# Patient Record
Sex: Male | Born: 1975 | Race: Black or African American | Hispanic: No | Marital: Single | Smoking: Current every day smoker
Health system: Southern US, Community
[De-identification: ages and names within clinical notes are randomized; demographics above are authoritative.]

## PROBLEM LIST (undated history)

## (undated) DIAGNOSIS — M1990 Unspecified osteoarthritis, unspecified site: Secondary | ICD-10-CM

## (undated) DIAGNOSIS — J40 Bronchitis, not specified as acute or chronic: Secondary | ICD-10-CM

## (undated) DIAGNOSIS — F32A Depression, unspecified: Secondary | ICD-10-CM

## (undated) DIAGNOSIS — M545 Low back pain, unspecified: Secondary | ICD-10-CM

## (undated) DIAGNOSIS — J45909 Unspecified asthma, uncomplicated: Secondary | ICD-10-CM

## (undated) DIAGNOSIS — T782XXA Anaphylactic shock, unspecified, initial encounter: Secondary | ICD-10-CM

## (undated) DIAGNOSIS — M71122 Other infective bursitis, left elbow: Secondary | ICD-10-CM

## (undated) HISTORY — DX: Unspecified osteoarthritis, unspecified site: M19.90

## (undated) HISTORY — PX: FACIAL FRACTURE SURGERY: SHX1570

## (undated) HISTORY — DX: Depression, unspecified: F32.A

## (undated) HISTORY — PX: ELBOW SURGERY: SHX618

## (undated) HISTORY — PX: WISDOM TOOTH EXTRACTION: SHX21

## (undated) HISTORY — PX: HERNIA REPAIR: SHX51

## (undated) HISTORY — DX: Other infective bursitis, left elbow: M71.122

---

## 2009-11-24 ENCOUNTER — Emergency Department (HOSPITAL_COMMUNITY): Admission: EM | Admit: 2009-11-24 | Discharge: 2009-11-24 | Payer: Self-pay | Admitting: Emergency Medicine

## 2010-12-25 LAB — RPR: RPR Ser Ql: NONREACTIVE

## 2011-01-01 ENCOUNTER — Emergency Department (HOSPITAL_COMMUNITY)
Admission: EM | Admit: 2011-01-01 | Discharge: 2011-01-01 | Disposition: A | Discharge status: Home / Self Care | Payer: Self-pay | Attending: Emergency Medicine | Admitting: Emergency Medicine

## 2011-01-01 ENCOUNTER — Emergency Department (HOSPITAL_COMMUNITY): Payer: Self-pay

## 2011-01-01 DIAGNOSIS — F411 Generalized anxiety disorder: Secondary | ICD-10-CM | POA: Insufficient documentation

## 2011-01-01 DIAGNOSIS — H05019 Cellulitis of unspecified orbit: Secondary | ICD-10-CM | POA: Insufficient documentation

## 2011-01-01 DIAGNOSIS — H5710 Ocular pain, unspecified eye: Secondary | ICD-10-CM | POA: Insufficient documentation

## 2011-01-01 MED ORDER — IOHEXOL 300 MG/ML  SOLN
100.0000 mL | Freq: Once | INTRAMUSCULAR | Status: AC | PRN
  Administered 2011-01-01: 100 mL via INTRAVENOUS

## 2011-04-11 ENCOUNTER — Emergency Department (HOSPITAL_COMMUNITY)
Admission: EM | Admit: 2011-04-11 | Discharge: 2011-04-11 | Disposition: A | Discharge status: Home / Self Care | Payer: Self-pay | Attending: Emergency Medicine | Admitting: Emergency Medicine

## 2011-04-11 DIAGNOSIS — H109 Unspecified conjunctivitis: Secondary | ICD-10-CM | POA: Insufficient documentation

## 2011-08-17 ENCOUNTER — Encounter: Payer: Self-pay | Admitting: Emergency Medicine

## 2011-08-17 ENCOUNTER — Emergency Department (HOSPITAL_COMMUNITY)
Admission: EM | Admit: 2011-08-17 | Discharge: 2011-08-18 | Disposition: A | Discharge status: Home / Self Care | Payer: Self-pay | Attending: Emergency Medicine | Admitting: Emergency Medicine

## 2011-08-17 DIAGNOSIS — R21 Rash and other nonspecific skin eruption: Secondary | ICD-10-CM | POA: Insufficient documentation

## 2011-08-17 DIAGNOSIS — M79609 Pain in unspecified limb: Secondary | ICD-10-CM | POA: Insufficient documentation

## 2011-08-17 MED ORDER — HYDROCODONE-ACETAMINOPHEN 5-325 MG PO TABS
1.0000 | ORAL_TABLET | Freq: Once | ORAL | Status: AC
  Administered 2011-08-18: 1 via ORAL
  Filled 2011-08-17: qty 1

## 2011-08-17 MED ORDER — PREDNISONE (PAK) 10 MG PO TABS: ORAL_TABLET | ORAL | Status: AC

## 2011-08-17 NOTE — ED Provider Notes (Signed)
History     CSN: [id number] Arrival date & time: 08/17/2011  7:58 PM   First MD Initiated Contact with Patient 08/17/11 2215      Chief Complaint  Patient presents with  . Pain   HPI History provided by the patient. The patient presents with complaints of rash, dry skin, itching and pain to his hands and feet. Symptoms began 5 days ago. Patient was traveling in [place] at that time. Patient mentions recent history of [place] fever diagnosis 3 weeks ago. He was treated with one week of doxycycline. Patient had felt well since that time with no other symptoms. Patient denies any previous history of skin conditions or known allergies. He denies any contact with any environmental chemicals or toxins.  Pt has been using Neosporin and hydrocortisone creams over skin regularly without improvement.  Pt denies other symptoms.  No fever, chills, sweats, N/V.    History reviewed. No pertinent past medical history.  Past Surgical History  Procedure Date  . Wisdom tooth extraction   . Facial fracture surgery     Family History  Problem Relation Age of Onset  . Hypertension Mother   . Heart attack Father   . Cancer Other     History  Substance Use Topics  . Smoking status: Passive Smoker  . Smokeless tobacco: Not on file  . Alcohol Use: Yes      Review of Systems  Constitutional: Negative for fever and chills.  Respiratory: Negative for shortness of breath.   Cardiovascular: Negative for chest pain.  Gastrointestinal: Negative for nausea, vomiting, abdominal pain and constipation.  Skin: Positive for rash.  All other systems reviewed and are negative.    Allergies  Review of patient's allergies indicates no known allergies.  Home Medications   Current Outpatient Rx  Name Route Sig Dispense Refill  . DIPHENHYDRAMINE HCL 25 MG PO TABS Oral Take 25 mg by mouth every 6 (six) hours as needed. Used for itching    . DOXYCYCLINE HYCLATE 100 MG PO TBEC Oral Take 100 mg  by mouth 2 (two) times daily.      Marland Kitchen NAPROXEN 500 MG PO TABS Oral Take 500 mg by mouth as needed. Used for pain      BP 142/89  Pulse 61  Temp(Src) 98.7 F (37.1 C) (Oral)  Resp 20  SpO2 97%  Physical Exam  Nursing note and vitals reviewed. Constitutional: He is oriented to person, place, and time. He appears well-developed and well-nourished. No distress.  HENT:  Head: Normocephalic and atraumatic.  Pulmonary/Chest: Effort normal. He has no wheezes. He has no rales.  Abdominal: Soft.  Neurological: He is alert and oriented to person, place, and time.  Skin: Skin is warm.       Dry scaling skin with desquamation over bilateral hands and feet with areas of dry hyperpigmented plaques.    ED Course  Procedures (including critical care time)  1. Rash       MDM  Patient seen and evaluated. Patient in no acute distress.  Pt seen and discussed with Attending Physician.  Will try oral steroid to see if rash responds.  Will give referral for dermatology.      Angus Seller, PA 08/18/11 [phone]

## 2011-08-17 NOTE — ED Notes (Signed)
Pt states he was seen at [facility] 3 Sundays ago and was diagnosed with [facility] Fever  Pt was given a weeks worth of doxycycline and completed that  Pt states the rash went away  Pt states now his skin in peeling off his hands, pt's feet are swollen, cracked and skin is peeling off them too, and is having pain in all his joints

## 2011-08-17 NOTE — Discharge Instructions (Signed)
At this time your providers today are unsure of the exact cause of your rash and symptoms. Recommend trying a course of oral steroid medications to see if this relieves her symptoms. If your condition worsens or does not improve is recommended the followup with the dermatology specialist. He'll he develop fever, chills, sweats, nausea or vomiting.  Rash, Generic Many things can cause a rash. We are not certain what is causing the rash that you have. Some causes include infection, allergic reactions, medications, and chemicals. Sometimes something in your home that comes in contact with your skin may cause the rash. These include pets, new soaps, cosmetics, and foods. HOME CARE INSTRUCTIONS   Avoid extreme heat or cold, unless otherwise instructed. This can make the itching worse.   A cool bath or shower or a cool washcloth can sometimes ease the itching.   Avoid scratching. This can cause infection.   Take those medications prescribed by your caregiver.  SEEK IMMEDIATE MEDICAL CARE IF:  You develop increasing pain, swelling, or redness.   You develop a fever.   You develop new or severe symptoms such as body aches and pains, diarrhea, vomiting.   Your rash is not better in 3 days.  Document Released: 09/10/2002 Document Revised: 06/02/2011 Document Reviewed: 11/15/2008 [facility] Patient Information 2012 [place], [place].

## 2011-08-17 NOTE — ED Provider Notes (Signed)
Medical screening examination/treatment/procedure(s) were conducted as a shared visit with non-physician practitioner(s) and myself.  I personally evaluated the patient during the encounter 35 year male, with no significant past medical history, who had recently been treated with doxycycline for a week in [place] spotted fever.  Presents to the emergency department complaining of peeling hands and feet with swelling and pain. No fever. No systemic sxs.    Nicholes Stairs, MD 08/17/11 [phone]

## 2011-08-18 MED ORDER — HYDROCODONE-ACETAMINOPHEN 5-325 MG PO TABS: 2.0000 | ORAL_TABLET | ORAL | Status: AC | PRN

## 2011-08-18 NOTE — ED Provider Notes (Signed)
Medical screening examination/treatment/procedure(s) were conducted as a shared visit with non-physician practitioner(s) and myself.  I personally evaluated the patient during the encounter Medical screening examination/treatment/procedure(s) were conducted as a shared visit with non-physician practitioner(s) and myself.  I personally evaluated the patient during the encounter 35 year male, with no significant past medical history, who had recently been treated with doxycycline for a week in [place] spotted fever.  Presents to the emergency department complaining of peeling hands and feet with swelling and pain. No fever. No systemic sxs.    Nicholes Stairs, MD 08/17/11 2359   Nicholes Stairs, MD 08/18/11 1535

## 2014-02-11 ENCOUNTER — Telehealth: Payer: Self-pay | Admitting: Family Medicine

## 2014-02-11 NOTE — Telephone Encounter (Signed)
Patient aware we are out of appts today and will call us in the am if not feeling better in the am

## 2014-05-12 ENCOUNTER — Emergency Department (HOSPITAL_COMMUNITY)
Admission: EM | Admit: 2014-05-12 | Discharge: 2014-05-12 | Disposition: A | Discharge status: Home / Self Care | Payer: 59 | Attending: Emergency Medicine | Admitting: Emergency Medicine

## 2014-05-12 ENCOUNTER — Encounter (HOSPITAL_COMMUNITY): Payer: Self-pay | Admitting: Emergency Medicine

## 2014-05-12 ENCOUNTER — Emergency Department (HOSPITAL_COMMUNITY): Payer: 59

## 2014-05-12 DIAGNOSIS — IMO0002 Reserved for concepts with insufficient information to code with codable children: Secondary | ICD-10-CM | POA: Diagnosis not present

## 2014-05-12 DIAGNOSIS — X58XXXA Exposure to other specified factors, initial encounter: Secondary | ICD-10-CM | POA: Insufficient documentation

## 2014-05-12 DIAGNOSIS — Y9289 Other specified places as the place of occurrence of the external cause: Secondary | ICD-10-CM | POA: Diagnosis not present

## 2014-05-12 DIAGNOSIS — S39012A Strain of muscle, fascia and tendon of lower back, initial encounter: Secondary | ICD-10-CM

## 2014-05-12 DIAGNOSIS — Y9389 Activity, other specified: Secondary | ICD-10-CM | POA: Diagnosis not present

## 2014-05-12 DIAGNOSIS — Z87891 Personal history of nicotine dependence: Secondary | ICD-10-CM | POA: Insufficient documentation

## 2014-05-12 MED ORDER — HYDROCODONE-ACETAMINOPHEN 5-325 MG PO TABS
1.0000 | ORAL_TABLET | Freq: Once | ORAL | Status: AC
  Administered 2014-05-12: 1 via ORAL
  Filled 2014-05-12: qty 1

## 2014-05-12 MED ORDER — CYCLOBENZAPRINE HCL 10 MG PO TABS
10.0000 mg | ORAL_TABLET | Freq: Three times a day (TID) | ORAL | Status: DC | PRN
Start: 2014-05-12 — End: 2015-09-17

## 2014-05-12 MED ORDER — PREDNISONE 10 MG PO TABS: ORAL_TABLET | ORAL | Status: DC

## 2014-05-12 MED ORDER — HYDROCODONE-ACETAMINOPHEN 5-325 MG PO TABS: ORAL_TABLET | ORAL | Status: DC

## 2014-05-12 MED ORDER — CYCLOBENZAPRINE HCL 10 MG PO TABS
10.0000 mg | ORAL_TABLET | Freq: Once | ORAL | Status: AC
  Administered 2014-05-12: 10 mg via ORAL
  Filled 2014-05-12: qty 1

## 2014-05-12 NOTE — ED Notes (Addendum)
PT c/o lower back pain worsening for the past two days with muscle tightness.
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
## 2014-05-12 NOTE — Discharge Instructions (Signed)
Back Pain, Adult Low back pain is very common. About 1 in 5 people have back pain.The cause of low back pain is rarely dangerous. The pain often gets better over time.About half of people with a sudden onset of back pain feel better in just 2 weeks. About 8 in 10 people feel better by 6 weeks.  CAUSES Some common causes of back pain include:  Strain of the muscles or ligaments supporting the spine.  Wear and tear (degeneration) of the spinal discs.  Arthritis.  Direct injury to the back. DIAGNOSIS Most of the time, the direct cause of low back pain is not known.However, back pain can be treated effectively even when the exact cause of the pain is unknown.Answering your caregiver's questions about your overall health and symptoms is one of the most accurate ways to make sure the cause of your pain is not dangerous. If your caregiver needs more information, he or she may order lab work or imaging tests (X-rays or MRIs).However, even if imaging tests show changes in your back, this usually does not require surgery. HOME CARE INSTRUCTIONS For many people, back pain returns.Since low back pain is rarely dangerous, it is often a condition that people can learn to manageon their own.   Remain active. It is stressful on the back to sit or stand in one place. Do not sit, drive, or stand in one place for more than 30 minutes at a time. Take short walks on level surfaces as soon as pain allows.Try to increase the length of time you walk each day.  Do not stay in bed.Resting more than 1 or 2 days can delay your recovery.  Do not avoid exercise or work.Your body is made to move.It is not dangerous to be active, even though your back may hurt.Your back will likely heal faster if you return to being active before your pain is gone.  Pay attention to your body when you bend and lift. Many people have less discomfortwhen lifting if they bend their knees, keep the load close to their bodies,and  avoid twisting. Often, the most comfortable positions are those that put less stress on your recovering back.  Find a comfortable position to sleep. Use a firm mattress and lie on your side with your knees slightly bent. If you lie on your back, put a pillow under your knees.  Only take over-the-counter or prescription medicines as directed by your caregiver. Over-the-counter medicines to reduce pain and inflammation are often the most helpful.Your caregiver may prescribe muscle relaxant drugs.These medicines help dull your pain so you can more quickly return to your normal activities and healthy exercise.  Put ice on the injured area.  Put ice in a plastic bag.  Place a towel between your skin and the bag.  Leave the ice on for 15-20 minutes, 03-04 times a day for the first 2 to 3 days. After that, ice and heat may be alternated to reduce pain and spasms.  Ask your caregiver about trying back exercises and gentle massage. This may be of some benefit.  Avoid feeling anxious or stressed.Stress increases muscle tension and can worsen back pain.It is important to recognize when you are anxious or stressed and learn ways to manage it.Exercise is a great option. SEEK MEDICAL CARE IF:  You have pain that is not relieved with rest or medicine.  You have pain that does not improve in 1 week.  You have new symptoms.  You are generally not feeling well. SEEK   IMMEDIATE MEDICAL CARE IF:   You have pain that radiates from your back into your legs.  You develop new bowel or bladder control problems.  You have unusual weakness or numbness in your arms or legs.  You develop nausea or vomiting.  You develop abdominal pain.  You feel faint. Document Released: 09/20/2005 Document Revised: 03/21/2012 Document Reviewed: 01/22/2014 [employer] Patient Information 2015 [employer]. This information is not intended to replace advice given to you by your health care provider. Make sure you  discuss any questions you have with your health care provider.  

## 2014-05-16 NOTE — ED Provider Notes (Signed)
Medical screening examination/treatment/procedure(s) were performed by non-physician practitioner and as supervising physician I was immediately available for consultation/collaboration.   EKG Interpretation None        Vanetta MuldersScott Chia Rock, MD 05/16/14 0210

## 2015-09-17 ENCOUNTER — Encounter (HOSPITAL_COMMUNITY): Payer: Self-pay | Admitting: Emergency Medicine

## 2015-09-17 ENCOUNTER — Emergency Department (HOSPITAL_COMMUNITY)
Admission: EM | Admit: 2015-09-17 | Discharge: 2015-09-17 | Disposition: A | Discharge status: Home / Self Care | Payer: Self-pay | Attending: Emergency Medicine | Admitting: Emergency Medicine

## 2015-09-17 DIAGNOSIS — M545 Low back pain: Secondary | ICD-10-CM | POA: Insufficient documentation

## 2015-09-17 HISTORY — DX: Low back pain, unspecified: M54.50

## 2015-09-17 HISTORY — DX: Low back pain: M54.5

## 2015-09-17 MED ORDER — HYDROCODONE-ACETAMINOPHEN 5-325 MG PO TABS: 2.0000 | ORAL_TABLET | ORAL | Status: DC | PRN

## 2015-09-17 MED ORDER — IBUPROFEN 800 MG PO TABS: 800.0000 mg | ORAL_TABLET | Freq: Three times a day (TID) | ORAL | Status: DC

## 2015-09-17 MED ORDER — METHYLPREDNISOLONE SODIUM SUCC 125 MG IJ SOLR
125.0000 mg | Freq: Once | INTRAMUSCULAR | Status: AC
  Administered 2015-09-17: 125 mg via INTRAMUSCULAR
  Filled 2015-09-17: qty 2

## 2015-09-17 MED ORDER — CYCLOBENZAPRINE HCL 10 MG PO TABS: 10.0000 mg | ORAL_TABLET | Freq: Two times a day (BID) | ORAL | Status: DC | PRN

## 2015-09-17 MED ORDER — KETOROLAC TROMETHAMINE 60 MG/2ML IM SOLN
60.0000 mg | Freq: Once | INTRAMUSCULAR | Status: AC
  Administered 2015-09-17: 60 mg via INTRAMUSCULAR
  Filled 2015-09-17: qty 2

## 2015-09-17 NOTE — ED Notes (Signed)
Pt c/o low back pain, left sided. Denies radiation. Denies injury. Denies GI/GU symptoms. Does report lying in "uncomfortable position" in bed last night.

## 2015-09-17 NOTE — Discharge Instructions (Signed)

## 2015-09-17 NOTE — ED Provider Notes (Signed)
CSN: [id number]     Arrival date & time 09/17/15  1336 History   First MD Initiated Contact with Patient 09/17/15 1416     Chief Complaint  Patient presents with  . Back Pain     (Consider location/radiation/quality/duration/timing/severity/associated sxs/prior Treatment) Patient is a 39 y.o. male presenting with back pain. The history is provided by the patient. No language interpreter was used.  Back Pain Location:  Lumbar spine Quality:  Aching Radiates to:  Does not radiate Pain severity:  Moderate Pain is:  Worse during the night Onset quality:  Gradual Duration:  2 days Timing:  Constant Chronicity:  New Context: not recent illness   Relieved by:  Nothing Worsened by:  Nothing tried Ineffective treatments:  None tried Associated symptoms: no leg pain and no numbness     Past Medical History  Diagnosis Date  . Low back pain    Past Surgical History  Procedure Laterality Date  . Wisdom tooth extraction    . Facial fracture surgery     Family History  Problem Relation Age of Onset  . Hypertension Mother   . Heart attack Father   . Cancer Other    Social History  Substance Use Topics  . Smoking status: Former Games developermoker  . Smokeless tobacco: None  . Alcohol Use: Yes     Comment: occassionally    Review of Systems  Musculoskeletal: Positive for back pain.  Neurological: Negative for numbness.  All other systems reviewed and are negative.     Allergies  Doxycycline  Home Medications   Prior to Admission medications   Medication Sig Start Date End Date Taking? Authorizing Provider  naproxen sodium (ALEVE) 220 MG tablet Take 440 mg by mouth daily as needed (for pain).   Yes Historical Provider, MD  cyclobenzaprine (FLEXERIL) 10 MG tablet Take 1 tablet (10 mg total) by mouth 2 (two) times daily as needed for muscle spasms. 09/17/15   Elson AreasLeslie K Raynell Scott, PA-C  HYDROcodone-acetaminophen (NORCO/VICODIN) 5-325 MG tablet Take 2 tablets by mouth every 4 (four)  hours as needed. 09/17/15   Elson AreasLeslie K Kalden Wanke, PA-C  ibuprofen (ADVIL,MOTRIN) 800 MG tablet Take 1 tablet (800 mg total) by mouth 3 (three) times daily. 09/17/15   Elson AreasLeslie K Dwayn Moravek, PA-C  predniSONE (DELTASONE) 10 MG tablet Take 6 tablets day one, 5 tablets day two, 4 tablets day three, 3 tablets day four, 2 tablets day five, then 1 tablet day six Patient not taking: Reported on 09/17/2015 05/12/14   Tammy Triplett, PA-C   BP 113/71 mmHg  Pulse 55  Temp(Src) 98 F (36.7 C) (Oral)  Resp 18  Ht 6' (1.829 m)  Wt 92.987 kg  BMI 27.80 kg/m2  SpO2 100% Physical Exam  Constitutional: He is oriented to person, place, and time. He appears well-developed and well-nourished.  HENT:  Head: Normocephalic.  Eyes: EOM are normal.  Neck: Normal range of motion.  Cardiovascular: Normal rate.   Pulmonary/Chest: Effort normal.  Abdominal: He exhibits no distension.  Musculoskeletal:  ls spine tender lower lumbar and sacral spine  nv and ns intact  Neurological: He is alert and oriented to person, place, and time.  Psychiatric: He has a normal mood and affect.  Nursing note and vitals reviewed.   ED Course  Procedures (including critical care time) Labs Review Labs Reviewed - No data to display  Imaging Review No results found. I have personally reviewed and evaluated these images and lab results as part of my medical decision-making.  EKG Interpretation None      MDM   Final diagnoses:  Low back pain without sciatica, unspecified back pain laterality    Meds ordered this encounter  Medications  . ketorolac (TORADOL) injection 60 mg    Sig:   . methylPREDNISolone sodium succinate (SOLU-MEDROL) 125 mg/2 mL injection 125 mg    Sig:   . cyclobenzaprine (FLEXERIL) 10 MG tablet    Sig: Take 1 tablet (10 mg total) by mouth 2 (two) times daily as needed for muscle spasms.    Dispense:  20 tablet    Refill:  0    Order Specific Question:  Supervising Provider    Answer:  MILLER, BRIAN  [[id number]]  . HYDROcodone-acetaminophen (NORCO/VICODIN) 5-325 MG tablet    Sig: Take 2 tablets by mouth every 4 (four) hours as needed.    Dispense:  20 tablet    Refill:  0    Order Specific Question:  Supervising Provider    Answer:  MILLER, BRIAN [[id number]]  . ibuprofen (ADVIL,MOTRIN) 800 MG tablet    Sig: Take 1 tablet (800 mg total) by mouth 3 (three) times daily.    Dispense:  21 tablet    Refill:  0    Order Specific Question:  Supervising Provider    Answer:  Eber Hong [[id number]]   An After Visit Summary was printed and given to the patient.   Elson Areas, PA-C 09/17/15 1608  Samuel Jester, DO 09/20/15 [phone]

## 2017-06-14 ENCOUNTER — Encounter (HOSPITAL_COMMUNITY): Payer: Self-pay

## 2017-06-14 ENCOUNTER — Emergency Department (HOSPITAL_COMMUNITY)
Admission: EM | Admit: 2017-06-14 | Discharge: 2017-06-15 | Disposition: A | Discharge status: Home / Self Care | Payer: Self-pay | Attending: Emergency Medicine | Admitting: Emergency Medicine

## 2017-06-14 DIAGNOSIS — Z87891 Personal history of nicotine dependence: Secondary | ICD-10-CM | POA: Insufficient documentation

## 2017-06-14 DIAGNOSIS — M79601 Pain in right arm: Secondary | ICD-10-CM

## 2017-06-14 DIAGNOSIS — S40021A Contusion of right upper arm, initial encounter: Secondary | ICD-10-CM | POA: Insufficient documentation

## 2017-06-14 DIAGNOSIS — Y9389 Activity, other specified: Secondary | ICD-10-CM | POA: Insufficient documentation

## 2017-06-14 DIAGNOSIS — W460XXA Contact with hypodermic needle, initial encounter: Secondary | ICD-10-CM | POA: Insufficient documentation

## 2017-06-14 DIAGNOSIS — Y999 Unspecified external cause status: Secondary | ICD-10-CM | POA: Insufficient documentation

## 2017-06-14 DIAGNOSIS — Y929 Unspecified place or not applicable: Secondary | ICD-10-CM | POA: Insufficient documentation

## 2017-06-14 DIAGNOSIS — Z79899 Other long term (current) drug therapy: Secondary | ICD-10-CM | POA: Insufficient documentation

## 2017-06-14 NOTE — ED Triage Notes (Addendum)
Pt reports that he was giving plasma on Sunday and they had problems and had to reaccess his vein. He reports that they gave him a infusion of normal saline and that since then, he has been experiencing pain and swelling in his R arm. Significant edema noted. He reports using Aleve, aspirin and ice at home without relief. He also is reporting paraesthesias in his R pinky and ring fingers. A&Ox4. Ambulatory.

## 2017-06-15 NOTE — ED Provider Notes (Signed)
TIME SEEN: 1:37 AM  CHIEF COMPLAINT: right arm swelling  HPI: Pt is a 41 y.o. Right-hand-dominant male who presents to the emergency department with complaints of right arm pain and swelling. He states that he donated plasma around 1 PM on Sunday September 9. He states they had a hard time placing the IV. He states they had to remove the IV and place it in his left arm. He was able to successfully donate plasma from his left arm. He states since that time he has had bruising, swelling to the right arm in some tingling in the tips of the fourth and fifth right digits. He has full range of motion in his hand and wrist and elbow. No other injury. No redness or warmth. No fever. He is not on antiplatelets or anticoagulants. States he does not believe he was given the medications other than possible IV fluids through the IV in his right arm.  ROS: See HPI Constitutional: no fever  Eyes: no drainage  ENT: no runny nose   Cardiovascular:  no chest pain  Resp: no SOB  GI: no vomiting GU: no dysuria Integumentary: no rash  Allergy: no hives  Musculoskeletal: no leg swelling  Neurological: no slurred speech ROS otherwise negative  PAST MEDICAL HISTORY/PAST SURGICAL HISTORY:  Past Medical History:  Diagnosis Date  . Low back pain     MEDICATIONS:  Prior to Admission medications   Medication Sig Start Date End Date Taking? Authorizing Provider  naproxen sodium (ALEVE) 220 MG tablet Take 440 mg by mouth daily as needed (for pain).   Yes [provider]  cyclobenzaprine (FLEXERIL) 10 MG tablet Take 1 tablet (10 mg total) by mouth 2 (two) times daily as needed for muscle spasms. Patient not taking: Reported on 06/15/2017 09/17/15   Elson Areas, PA-C  HYDROcodone-acetaminophen (NORCO/VICODIN) 5-325 MG tablet Take 2 tablets by mouth every 4 (four) hours as needed. Patient not taking: Reported on 06/15/2017 09/17/15   Elson Areas, PA-C  ibuprofen (ADVIL,MOTRIN) 800 MG tablet Take 1  tablet (800 mg total) by mouth 3 (three) times daily. Patient not taking: Reported on 06/15/2017 09/17/15   Elson Areas, PA-C  predniSONE (DELTASONE) 10 MG tablet Take 6 tablets day one, 5 tablets day two, 4 tablets day three, 3 tablets day four, 2 tablets day five, then 1 tablet day six Patient not taking: Reported on 09/17/2015 05/12/14   Pauline Aus, PA-C    ALLERGIES:  Allergies  Allergen Reactions  . Doxycycline Hives    SOCIAL HISTORY:  Social History  Substance Use Topics  . Smoking status: Former Games developer  . Smokeless tobacco: Not on file  . Alcohol use Yes     Comment: occassionally    FAMILY HISTORY: Family History  Problem Relation Age of Onset  . Hypertension Mother   . Heart attack Father   . Cancer Other     EXAM: BP (!) 109/93 (BP Location: Left Arm)   Pulse 70   Temp 98.1 F (36.7 C) (Oral)   Resp 16   SpO2 95%  CONSTITUTIONAL: Alert and oriented and responds appropriately to questions. Well-appearing; well-nourished HEAD: Normocephalic EYES: Conjunctivae clear, pupils appear equal, EOMI ENT: normal nose; moist mucous membranes NECK: Supple, no meningismus, no nuchal rigidity, no LAD  CARD: RRR; S1 and S2 appreciated; no murmurs, no clicks, no rubs, no gallops RESP: Normal chest excursion without splinting or tachypnea; breath sounds clear and equal bilaterally; no wheezes, no rhonchi, no rales, no hypoxia  or respiratory distress, speaking full sentences ABD/GI: Normal bowel sounds; non-distended; soft, non-tender, no rebound, no guarding, no peritoneal signs, no hepatosplenomegaly BACK:  The back appears normal and is non-tender to palpation, there is no CVA tenderness EXT: patient has very mild swelling to the right inner elbow with ecchymosis around the right antecubital fossa. Compartments are completely soft. He has full range of motion in his right elbow, wrist and hand. Normal grip strength. Normal radial and ulnar pulses bilaterally and normal  capillary refill in the right fingertips. He reports some tingling but no decreased sensation in the right hand. Normal movement of the right arm.  Normal ROM in all joints; otherwise extremities are non-tender to palpation; no edema; normal capillary refill; no cyanosis, there is no redness or warmth noted to the right arm, no induration or fluctuance   SKIN: Normal color for age and race; warm; no rash NEURO: Moves all extremities equally PSYCH: The patient's mood and manner are appropriate. Grooming and personal hygiene are appropriate.  MEDICAL DECISION MAKING: Patient here with ecchymosis and very mild swelling noted to the right arm after he had an IV placed in his right antecubital fossa to donate plasma a few days ago. He is not on antiplatelets or anticoagulants. I doubt that there is any active extravasation at this time. He has no sign of compartment syndrome at all. No sign of superimposed infection. Have recommended that he keep his arm elevated and apply ice. Recommend alternating Tylenol and Motrin as needed for pain. I do not think he needs antibiotics, imaging. I do not think this is a DVT. No sign of gout, septic arthritis, cellulitis at this time. I feel he is safe for discharge. Patient comfortable with this plan. We did discussed return precautions especially signs and symptoms of compartment syndrome.  I suspect that the tingling in his fourth and fifth digit are likely from either neuropraxia from possible nerve injury after the IV was placed near the nerve and the neurovascular bundle or from compression from swelling over this inner and posterior aspect of the right elbow around the ulnar nerve.  Discussed with patient that this should improve over time and as the swelling decreases.  He states he is concerned because it looked like the swelling and bruising was moving down his arm but I think that this is likely secondary to gravity.   At this time, I do not feel there is any  life-threatening condition present. I have reviewed and discussed all results (EKG, imaging, lab, urine as appropriate) and exam findings with patient/family. I have reviewed nursing notes and appropriate previous records.  I feel the patient is safe to be discharged home without further emergent workup and can continue workup as an outpatient as needed. Discussed usual and customary return precautions. Patient/family verbalize understanding and are comfortable with this plan.  Outpatient follow-up has been provided if needed. All questions have been answered.     Ahjanae Cassel, Layla MawKristen N, DO 06/15/17 [phone]

## 2017-06-15 NOTE — Discharge Instructions (Signed)
You may use Naproxen  every 12 hours as needed for pain.

## 2018-08-27 ENCOUNTER — Emergency Department (HOSPITAL_COMMUNITY)
Admission: EM | Admit: 2018-08-27 | Discharge: 2018-08-27 | Disposition: A | Discharge status: Home / Self Care | Payer: Self-pay | Attending: Emergency Medicine | Admitting: Emergency Medicine

## 2018-08-27 ENCOUNTER — Other Ambulatory Visit: Payer: Self-pay

## 2018-08-27 ENCOUNTER — Encounter (HOSPITAL_COMMUNITY): Payer: Self-pay | Admitting: Emergency Medicine

## 2018-08-27 DIAGNOSIS — S025XXA Fracture of tooth (traumatic), initial encounter for closed fracture: Secondary | ICD-10-CM | POA: Insufficient documentation

## 2018-08-27 DIAGNOSIS — Y999 Unspecified external cause status: Secondary | ICD-10-CM | POA: Insufficient documentation

## 2018-08-27 DIAGNOSIS — Y93E8 Activity, other personal hygiene: Secondary | ICD-10-CM | POA: Insufficient documentation

## 2018-08-27 DIAGNOSIS — F17210 Nicotine dependence, cigarettes, uncomplicated: Secondary | ICD-10-CM | POA: Insufficient documentation

## 2018-08-27 DIAGNOSIS — Y929 Unspecified place or not applicable: Secondary | ICD-10-CM | POA: Insufficient documentation

## 2018-08-27 DIAGNOSIS — S0993XA Unspecified injury of face, initial encounter: Secondary | ICD-10-CM

## 2018-08-27 DIAGNOSIS — X58XXXA Exposure to other specified factors, initial encounter: Secondary | ICD-10-CM | POA: Insufficient documentation

## 2018-08-27 MED ORDER — ACETAMINOPHEN 325 MG PO TABS
650.0000 mg | ORAL_TABLET | Freq: Once | ORAL | Status: AC
  Administered 2018-08-27: 650 mg via ORAL
  Filled 2018-08-27: qty 2

## 2018-08-27 MED ORDER — PENICILLIN V POTASSIUM 500 MG PO TABS: 500.0000 mg | ORAL_TABLET | Freq: Three times a day (TID) | ORAL | 0 refills | Status: DC

## 2018-08-27 MED ORDER — IBUPROFEN 200 MG PO TABS
600.0000 mg | ORAL_TABLET | Freq: Once | ORAL | Status: AC
  Administered 2018-08-27: 600 mg via ORAL
  Filled 2018-08-27: qty 3

## 2018-08-27 NOTE — ED Triage Notes (Signed)
Patient came in by self from home. Pt c/o dental pain on the right side of his mouth. Patient states that he was brushing his teeth when he started to experience pain. Also, the patient states that he has a cavity on the posterior portion of the right side of his mouth.   Patient rates pain 10/10.

## 2018-08-27 NOTE — ED Provider Notes (Signed)
[facility]-EMERGENCY DEPT Provider Note   CSN: [id number] Arrival date & time: 08/27/18  0759     History   Chief Complaint Chief Complaint  Patient presents with  . Dental Pain    HPI Keith Swanson is a 42 y.o. male.  HPI Patient is a 42 year old male presents the emergency department with acute onset dental pain.  He reports that he was brushing his teeth and tooth #6 cracked.  It is currently loose and he presents with pain.  No facial swelling.  This happened acutely this morning.  No other complaints at this time.  Patient notes that he has had a cavity in this tooth for some time.   Past Medical History:  Diagnosis Date  . Low back pain     There are no active problems to display for this patient.   Past Surgical History:  Procedure Laterality Date  . FACIAL FRACTURE SURGERY    . WISDOM TOOTH EXTRACTION          Home Medications    Prior to Admission medications   Medication Sig Start Date End Date Taking? Authorizing Provider  acetaminophen (TYLENOL) 500 MG tablet Take 1,500 mg by mouth every 6 (six) hours as needed for mild pain.   Yes [provider]    Family History Family History  Problem Relation Age of Onset  . Hypertension Mother   . Heart attack Father   . Cancer Other     Social History Social History   Tobacco Use  . Smoking status: Light Tobacco Smoker    Types: Cigarettes  . Smokeless tobacco: Never Used  Substance Use Topics  . Alcohol use: Yes    Comment: occassionally  . Drug use: No     Allergies   Doxycycline   Review of Systems Review of Systems  All other systems reviewed and are negative.    Physical Exam Updated Vital Signs BP (!) 139/99 (BP Location: Left Arm)   Pulse 95   Temp 98.6 F (37 C) (Oral)   Resp 18   Ht 6' (1.829 m)   Wt 93 kg   SpO2 100%   BMI 27.80 kg/m   Physical Exam  Constitutional: He is oriented to person, place, and time. He appears well-developed  and well-nourished.  HENT:  Head: Normocephalic.  Rennis Hardingllis class #3 fracture with loose distal aspect of tooth  Eyes: EOM are normal.  Neck: Normal range of motion.  Pulmonary/Chest: Effort normal.  Abdominal: He exhibits no distension.  Musculoskeletal: Normal range of motion.  Neurological: He is alert and oriented to person, place, and time.  Psychiatric: He has a normal mood and affect.  Nursing note and vitals reviewed.    ED Treatments / Results  Labs (all labs ordered are listed, but only abnormal results are displayed) Labs Reviewed - No data to display  EKG None  Radiology No results found.  Procedures .Foreign Body Removal Performed by: Azalia Bilisampos, Zakarie Sturdivant, MD Authorized by: Azalia Bilisampos, Neville Walston, MD  Consent: Verbal consent obtained. Patient understanding: patient states understanding of the procedure being performed Intake: mouth - tooth #6.  Sedation: Patient sedated: no  Complexity: simple 1 objects recovered. Objects recovered: portion of tooth #6 Patient tolerance: Patient tolerated the procedure well with no immediate complications    Medications Ordered in ED Medications  ibuprofen (ADVIL,MOTRIN) tablet 600 mg (has no administration in time range)  acetaminophen (TYLENOL) tablet 650 mg (has no administration in time range)     Initial Impression /  Assessment and Plan / ED Course  I have reviewed the triage vital signs and the nursing notes.  Pertinent labs & imaging results that were available during my care of the patient were reviewed by me and considered in my medical decision making (see chart for details).     Patient tolerated the procedure well.  Outpatient dental follow-up.  Final Clinical Impressions(s) / ED Diagnoses   Final diagnoses:  Dental injury, initial encounter    ED Discharge Orders    None       Azalia Bilis, MD 08/27/18 [phone]

## 2018-08-27 NOTE — ED Notes (Signed)
Patient given discharge teaching and verbalized understanding. Patient ambulated out of ED with a steady gait. 

## 2018-08-27 NOTE — Discharge Instructions (Addendum)
Take ibuprofen and tylenol for the pain.   Please follow up with a dentist

## 2018-12-18 ENCOUNTER — Other Ambulatory Visit: Payer: Self-pay

## 2018-12-18 ENCOUNTER — Encounter (HOSPITAL_COMMUNITY): Payer: Self-pay | Admitting: *Deleted

## 2018-12-18 ENCOUNTER — Inpatient Hospital Stay (HOSPITAL_COMMUNITY)
Admission: EM | Admit: 2018-12-18 | Discharge: 2018-12-21 | DRG: 916 | Disposition: A | Discharge status: Home / Self Care | Payer: Self-pay | Attending: Internal Medicine | Admitting: Internal Medicine

## 2018-12-18 ENCOUNTER — Emergency Department (HOSPITAL_COMMUNITY): Payer: Self-pay

## 2018-12-18 DIAGNOSIS — B948 Sequelae of other specified infectious and parasitic diseases: Secondary | ICD-10-CM | POA: Diagnosis present

## 2018-12-18 DIAGNOSIS — M2550 Pain in unspecified joint: Secondary | ICD-10-CM | POA: Diagnosis present

## 2018-12-18 DIAGNOSIS — M0230 Reiter's disease, unspecified site: Secondary | ICD-10-CM | POA: Diagnosis present

## 2018-12-18 DIAGNOSIS — F17210 Nicotine dependence, cigarettes, uncomplicated: Secondary | ICD-10-CM | POA: Diagnosis present

## 2018-12-18 DIAGNOSIS — R0600 Dyspnea, unspecified: Secondary | ICD-10-CM | POA: Diagnosis present

## 2018-12-18 DIAGNOSIS — X58XXXA Exposure to other specified factors, initial encounter: Secondary | ICD-10-CM | POA: Diagnosis present

## 2018-12-18 DIAGNOSIS — B950 Streptococcus, group A, as the cause of diseases classified elsewhere: Secondary | ICD-10-CM | POA: Diagnosis present

## 2018-12-18 DIAGNOSIS — Z881 Allergy status to other antibiotic agents status: Secondary | ICD-10-CM

## 2018-12-18 DIAGNOSIS — T360X5A Adverse effect of penicillins, initial encounter: Secondary | ICD-10-CM | POA: Diagnosis present

## 2018-12-18 DIAGNOSIS — J020 Streptococcal pharyngitis: Secondary | ICD-10-CM | POA: Diagnosis present

## 2018-12-18 DIAGNOSIS — T783XXA Angioneurotic edema, initial encounter: Principal | ICD-10-CM | POA: Diagnosis present

## 2018-12-18 DIAGNOSIS — Z791 Long term (current) use of non-steroidal anti-inflammatories (NSAID): Secondary | ICD-10-CM

## 2018-12-18 DIAGNOSIS — K047 Periapical abscess without sinus: Secondary | ICD-10-CM | POA: Diagnosis present

## 2018-12-18 DIAGNOSIS — Z8249 Family history of ischemic heart disease and other diseases of the circulatory system: Secondary | ICD-10-CM

## 2018-12-18 DIAGNOSIS — R21 Rash and other nonspecific skin eruption: Secondary | ICD-10-CM | POA: Diagnosis present

## 2018-12-18 DIAGNOSIS — Z79899 Other long term (current) drug therapy: Secondary | ICD-10-CM

## 2018-12-18 DIAGNOSIS — M25531 Pain in right wrist: Secondary | ICD-10-CM

## 2018-12-18 LAB — CBC WITH DIFFERENTIAL/PLATELET
Abs Immature Granulocytes: 0.08 10*3/uL — ABNORMAL HIGH (ref 0.00–0.07)
BASOS PCT: 0 %
Basophils Absolute: 0 10*3/uL (ref 0.0–0.1)
Eosinophils Absolute: 0.1 10*3/uL (ref 0.0–0.5)
Eosinophils Relative: 1 %
HCT: 44.3 % (ref 39.0–52.0)
Hemoglobin: 14.7 g/dL (ref 13.0–17.0)
Immature Granulocytes: 1 %
Lymphocytes Relative: 12 %
Lymphs Abs: 2.1 10*3/uL (ref 0.7–4.0)
MCH: 29.1 pg (ref 26.0–34.0)
MCHC: 33.2 g/dL (ref 30.0–36.0)
MCV: 87.7 fL (ref 80.0–100.0)
Monocytes Absolute: 1.2 10*3/uL — ABNORMAL HIGH (ref 0.1–1.0)
Monocytes Relative: 7 %
NEUTROS ABS: 14 10*3/uL — AB (ref 1.7–7.7)
NEUTROS PCT: 79 %
PLATELETS: 247 10*3/uL (ref 150–400)
RBC: 5.05 MIL/uL (ref 4.22–5.81)
RDW: 12 % (ref 11.5–15.5)
WBC: 17.5 10*3/uL — ABNORMAL HIGH (ref 4.0–10.5)
nRBC: 0 % (ref 0.0–0.2)

## 2018-12-18 LAB — COMPREHENSIVE METABOLIC PANEL
ALBUMIN: 3.9 g/dL (ref 3.5–5.0)
ALK PHOS: 74 U/L (ref 38–126)
ALT: 25 U/L (ref 0–44)
AST: 21 U/L (ref 15–41)
Anion gap: 9 (ref 5–15)
BILIRUBIN TOTAL: 1.4 mg/dL — AB (ref 0.3–1.2)
BUN: 11 mg/dL (ref 6–20)
CALCIUM: 8.9 mg/dL (ref 8.9–10.3)
CO2: 23 mmol/L (ref 22–32)
Chloride: 105 mmol/L (ref 98–111)
Creatinine, Ser: 1.03 mg/dL (ref 0.61–1.24)
GFR calc Af Amer: >60 mL/min (ref >60)
GFR calc non Af Amer: >60 mL/min (ref >60)
GLUCOSE: 106 mg/dL — AB (ref 70–99)
Potassium: 3.6 mmol/L (ref 3.5–5.1)
Sodium: 137 mmol/L (ref 135–145)
TOTAL PROTEIN: 7.6 g/dL (ref 6.5–8.1)

## 2018-12-18 LAB — INFLUENZA PANEL BY PCR (TYPE A & B)
Influenza A By PCR: NEGATIVE
Influenza B By PCR: NEGATIVE

## 2018-12-18 LAB — LACTIC ACID, PLASMA
Lactic Acid, Venous: 1 mmol/L (ref 0.5–1.9)
Lactic Acid, Venous: 1 mmol/L (ref 0.5–1.9)

## 2018-12-18 LAB — LIPASE, BLOOD: Lipase: 39 U/L (ref 11–51)

## 2018-12-18 LAB — GROUP A STREP BY PCR: Group A Strep by PCR: DETECTED — AB

## 2018-12-18 MED ORDER — SODIUM CHLORIDE 0.9 % IV BOLUS
1000.0000 mL | Freq: Once | INTRAVENOUS | Status: AC
  Administered 2018-12-18: 1000 mL via INTRAVENOUS

## 2018-12-18 MED ORDER — HYDROXYZINE HCL 10 MG PO TABS
10.0000 mg | ORAL_TABLET | Freq: Once | ORAL | Status: AC
  Administered 2018-12-19: 10 mg via ORAL
  Filled 2018-12-18: qty 1

## 2018-12-18 MED ORDER — PENICILLIN V POTASSIUM 500 MG PO TABS
500.0000 mg | ORAL_TABLET | Freq: Once | ORAL | Status: AC
  Administered 2018-12-19: 500 mg via ORAL
  Filled 2018-12-18: qty 1

## 2018-12-18 NOTE — ED Notes (Signed)
Bed: IL57 Expected date:  Expected time:  Means of arrival:  Comments: Bleaching ready at 2030

## 2018-12-18 NOTE — ED Triage Notes (Signed)
Pt reports pruritic urticaria since early this am.  Has taken Benadryl 50mg  without relief.  He feels feverish.  He also endorses feeling fatigued with productive cough.  The cough started about a week ago with fever.  Denies any recent travel or poss exposure.  He also reports blurred vision.

## 2018-12-18 NOTE — ED Notes (Signed)
Patient transported to X-ray 

## 2018-12-18 NOTE — ED Provider Notes (Signed)
Emergency Department Provider Note   I have reviewed the triage vital signs and the nursing notes.   HISTORY  Chief Complaint Urticaria and Angioedema   HPI Keith Swanson is a 43 y.o. male with no significant PMH presents to the emergency department for evaluation of lower lip swelling with rash.  Symptoms began last night with rash which he has developed over his arms, legs, trunk.  He has rash on the palms and soles as well.  Today, he developed lower lip swelling without injury.  He is not on prescription medication.  He denies any sensation of throat or tongue swelling.  No shortness of breath or chest pain.  No vomiting, diarrhea, abdominal cramping.  He has a remote history of [place] spotted fever that was many years ago.  He is having aching and pain in his joints.  No flulike symptoms, travel, sick contacts.  Patient did have some sore throat last week but that resolved with supportive care at home.  No recent antibiotics.   Past Medical History:  Diagnosis Date  . Low back pain     Patient Active Problem List   Diagnosis Date Noted  . Streptococcal infection group A 12/18/2018    Past Surgical History:  Procedure Laterality Date  . FACIAL FRACTURE SURGERY    . WISDOM TOOTH EXTRACTION      Allergies Doxycycline  Family History  Problem Relation Age of Onset  . Hypertension Mother   . Heart attack Father   . Cancer Other     Social History Social History   Tobacco Use  . Smoking status: Current Every Day Smoker    Packs/day: 0.50    Types: Cigarettes  . Smokeless tobacco: Never Used  Substance Use Topics  . Alcohol use: Yes    Comment: occassionally  . Drug use: No    Review of Systems  Constitutional: Positive fever.  Eyes: No visual changes. ENT: Positive sore throat. Positive lower lip swelling.  Cardiovascular: Denies chest pain. Respiratory: Denies shortness of breath. Gastrointestinal: No abdominal pain.  No nausea, no vomiting.  No  diarrhea.  No constipation. Genitourinary: Negative for dysuria. Musculoskeletal: Negative for back pain. Skin: Positive rash.  Neurological: Negative for headaches, focal weakness or numbness.  10-point ROS otherwise negative.  ____________________________________________   PHYSICAL EXAM:  VITAL SIGNS: ED Triage Vitals [12/18/18 2114]  Enc Vitals Group     BP 118/88     Pulse Rate 95     Resp 20     Temp 99 F (37.2 C)     Temp Source Oral     SpO2 98 %   Constitutional: Alert and oriented. Well appearing and in no acute distress. Eyes: Conjunctivae are normal.  Head: Atraumatic. Nose: No congestion/rhinnorhea. Mouth/Throat: Mucous membranes are moist.  Oropharynx non-erythematous. Isolated lower lip swelling. Normal tongue. No oral abscess. No PTA. Clear voice. No trismus.  Neck: No stridor.   Cardiovascular: Normal rate, regular rhythm. Good peripheral circulation. Grossly normal heart sounds.   Respiratory: Normal respiratory effort.  No retractions. Lungs CTAB. Gastrointestinal: Soft and nontender. No distention.  Musculoskeletal: No lower extremity tenderness nor edema. No gross deformities of extremities. Neurologic:  Normal speech and language. No gross focal neurologic deficits are appreciated.  Skin:  Skin is warm, dry and intact. Erythema patches over the arms, palms/soles.   ____________________________________________   LABS (all labs ordered are listed, but only abnormal results are displayed)  Labs Reviewed  GROUP A STREP BY PCR -  Abnormal; Notable for the following components:      Result Value   Group A Strep by PCR DETECTED (*)    All other components within normal limits  COMPREHENSIVE METABOLIC PANEL - Abnormal; Notable for the following components:   Glucose, Bld 106 (*)    Total Bilirubin 1.4 (*)    All other components within normal limits  CBC WITH DIFFERENTIAL/PLATELET - Abnormal; Notable for the following components:   WBC 17.5 (*)     Neutro Abs 14.0 (*)    Monocytes Absolute 1.2 (*)    Abs Immature Granulocytes 0.08 (*)    All other components within normal limits  CULTURE, BLOOD (ROUTINE X 2)  CULTURE, BLOOD (ROUTINE X 2)  LIPASE, BLOOD  LACTIC ACID, PLASMA  LACTIC ACID, PLASMA  INFLUENZA PANEL BY PCR (TYPE A & B)  URINALYSIS, ROUTINE W REFLEX MICROSCOPIC  SEDIMENTATION RATE  C-REACTIVE PROTEIN   ____________________________________________  RADIOLOGY  Dg Chest 2 View  Result Date: 12/18/2018 CLINICAL DATA:  Shortness of breath, fever EXAM: CHEST - 2 VIEW COMPARISON:  06/20/2018 FINDINGS: Heart and mediastinal contours are within normal limits. No focal opacities or effusions. No acute bony abnormality. IMPRESSION: No active cardiopulmonary disease. Electronically Signed   By: Rolm Baptise M.D.   On: 12/18/2018 22:35    ____________________________________________   PROCEDURES  Procedure(s) performed:   Procedures  None  ____________________________________________   INITIAL IMPRESSION / ASSESSMENT AND PLAN / ED COURSE  Pertinent labs & imaging results that were available during my care of the patient were reviewed by me and considered in my medical decision making (see chart for details).  Patient presents to the emergency department for evaluation of rash, fever, lower lip swelling.  Afebrile here with normal sats.  Patient is overall well-appearing.  He has isolated lower lip swelling with splotchy rash in the arms, palms, soles.  No shortness of breath or flulike symptoms.  No exam or history consistent with anaphylactic reaction.  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  Patient is not on ACE/ARB medication.   11:32 PM  Patient with leukocytosis on labs. Adding ESR/CPR.  Given his a rash characteristic, arthralgias, strep pharyngitis my suspicion for rheumatic fever is elevated.  He has no heart murmurs or fluid overload.  I think the patient would benefit from observation overnight given his isolated  lower lip swelling and possibility of hereditary angioedema.  He is in no acute distress and speaking clearly.  Will start penicillin here.  In discussing the patient's sore throat from 2 weeks ago he states that he did take some leftover penicillin for several days but unsure of the dosing or how Imberly Troxler he took it.   Discussed patient's case with Hospitalist, Dr. Roel Cluck to request admission. Patient and family (if present) updated with plan. Care transferred to Hospitalist service.  I reviewed all nursing notes, vitals, pertinent old records, EKGs, labs, imaging (as available).  ____________________________________________  FINAL CLINICAL IMPRESSION(S) / ED DIAGNOSES  Final diagnoses:  Angioedema of lips, initial encounter  Strep pharyngitis    MEDICATIONS GIVEN DURING THIS VISIT:  Medications  penicillin v potassium (VEETID) tablet 500 mg (has no administration in time range)  hydrOXYzine (ATARAX/VISTARIL) tablet 10 mg (has no administration in time range)  sodium chloride 0.9 % bolus 1,000 mL (1,000 mLs Intravenous New Bag/Given 12/18/18 2223)    Note:  This document was prepared using Dragon voice recognition software and may include unintentional dictation errors.  Nanda Quinton, MD Emergency Medicine    Analaya Hoey, Vonna Kotyk  G, MD 12/18/18 [phone]

## 2018-12-18 NOTE — H&P (Signed)
Keith Swanson ZOX:[id number] DOB: 03-29-1976 DOA: 12/18/2018     PCP: Patient, No Pcp Per   Outpatient Specialists:  NONE    Patient arrived to ER on 12/18/18 at 2049  Patient coming from: home Lives    With family    Chief Complaint:  Chief Complaint  Patient presents with   Urticaria   Angioedema    HPI: Keith Swanson is a 43 y.o. male with no significant medical history       Presented with hives started this a.m. he took some Benadryl did not seem to help he feels subjective fevers and tired with productive cough which started about a week ago and fever.  No recent travel or   COVID 19 exposure. Noted to have lower lip swelling with rash he is not on any prescription medications.  This started today around midday.  Initially no throat swelling or tongue swelling.  No associated vomiting diarrhea abdominal pain.  He does endorse body aches in his joints mainly in his feet and overall the rest of the large joints.  But no flulike symptoms otherwise. Rash is on his Hands and Feet coming and going very pruritic.  2 weeks ago he developed significant sore throat with white patches he took some Penicillin at home but not sure how much.  Since then his throat pain has improved but he did have some runny nose and also reports that his hoarse voice is somewhat hoarse and he has swelling underneath his neck.   Not on an ACE inhibitor or ARB reports a few years ago he developed [facility] spotted fever at a dose of doxycycline and develop skin peeling over his hands and feet     While in ER: Positive for strep started on penicillin Negative For influenza Now feels like his tongue feels a little full as well he continues to have significant pruritic urticarial rash  The following Work up has been ordered so far:  Orders Placed This Encounter  Procedures   Group A Strep by PCR   Culture, blood (routine x 2)   DG Chest 2 View   Comprehensive metabolic panel   Lipase, blood    CBC with Differential   Lactic acid, plasma   Urinalysis, Routine w reflex microscopic   Influenza panel by PCR (type A & B)   Sedimentation rate   C-reactive protein   Cardiac monitoring   Consult to hospitalist   Droplet precaution   Saline lock IV   Following Medications were ordered in ER: Medications  penicillin v potassium (VEETID) tablet 500 mg (has no administration in time range)  sodium chloride 0.9 % bolus 1,000 mL (1,000 mLs Intravenous New Bag/Given 12/18/18 2223)        Consult Orders  (From admission, onward)         Start     Ordered   12/18/18 2314  Consult to hospitalist  Once    Provider:  Therisa Doyne, MD  Question Answer Comment  Place call to: [facility]   Reason for Consult Admit      12/18/18 2314          Significant initial  Findings: Abnormal Labs Reviewed  GROUP A STREP BY PCR - Abnormal; Notable for the following components:      Result Value   Group A Strep by PCR DETECTED (*)    All other components within normal limits  COMPREHENSIVE METABOLIC PANEL - Abnormal; Notable for the following components:  Glucose, Bld 106 (*)    Total Bilirubin 1.4 (*)    All other components within normal limits  CBC WITH DIFFERENTIAL/PLATELET - Abnormal; Notable for the following components:   WBC 17.5 (*)    Neutro Abs 14.0 (*)    Monocytes Absolute 1.2 (*)    Abs Immature Granulocytes 0.08 (*)    All other components within normal limits    Otherwise labs showing:    Recent Labs  Lab 12/18/18 2201  NA 137  K 3.6  CO2 23  GLUCOSE 106*  BUN 11  CREATININE 1.03  CALCIUM 8.9    Cr    stable,   Lab Results  Component Value Date   CREATININE 1.03 12/18/2018    Recent Labs  Lab 12/18/18 2201  AST 21  ALT 25  ALKPHOS 74  BILITOT 1.4*  PROT 7.6  ALBUMIN 3.9   WBC       Component Value Date/Time   WBC 17.5 (H) 12/18/2018 2201    Lactic Acid, Venous    Component Value Date/Time   LATICACIDVEN 1.0  12/19/2018 0001       HG/HCT stable,      Component Value Date/Time   HGB 14.7 12/18/2018 2201   HCT 44.3 12/18/2018 2201    Recent Labs  Lab 12/18/18 2201  LIPASE 39   No results for input(s): AMMONIA in the last 168 hours.    Troponin (Point of Care Test) No results for input(s): TROPIPOC in the last 72 hours.    UA not ordered   Urine analysis: ordered   CXR -  NON acute    ECG:  Not ordered     ED Triage Vitals  Enc Vitals Group     BP 12/18/18 2114 118/88     Pulse Rate 12/18/18 2114 95     Resp 12/18/18 2114 20     Temp 12/18/18 2114 99 F (37.2 C)     Temp Source 12/18/18 2114 Oral     SpO2 12/18/18 2114 98 %     Weight --      Height --      Head Circumference --      Peak Flow --      Pain Score 12/18/18 2223 6     Pain Loc --      Pain Edu? --      Excl. in GC? --   TMAX(24)@       Latest  Blood pressure 118/88, pulse 95, temperature 99 F (37.2 C), temperature source Oral, resp. rate 20, SpO2 98 %.   Hospitalist was called for admission for angioedema possible poststreptococcal arthritis versus rheumatic fever   Review of Systems:    Pertinent positives include: Fevers, chills, fatigue, non-productive cough, Sore throat,    Constitutional:  No weight loss, night sweats,  weight loss  HEENT:  No headaches, Difficulty swallowing,Tooth/dental problems,No sneezing, itching, ear ache, nasal congestion, post nasal drip,  Cardio-vascular:  No chest pain, Orthopnea, PND, anasarca, dizziness, palpitations.no Bilateral lower extremity swelling  GI:  No heartburn, indigestion, abdominal pain, nausea, vomiting, diarrhea, change in bowel habits, loss of appetite, melena, blood in stool, hematemesis Resp:  no shortness of breath at rest. No dyspnea on exertion, No excess mucus, no productive cough, No  No coughing up of blood.No change in color of mucus.No wheezing. Skin:  no rash or lesions. No jaundice GU:  no dysuria, change in color of  urine, no urgency or frequency. No straining to urinate.  No flank pain.  Musculoskeletal:  No joint pain or no joint swelling. No decreased range of motion. No back pain.  Psych:  No change in mood or affect. No depression or anxiety. No memory loss.  Neuro: no localizing neurological complaints, no tingling, no weakness, no double vision, no gait abnormality, no slurred speech, no confusion  All systems reviewed and apart from HOPI all are negative  Past Medical History:   Past Medical History:  Diagnosis Date   Low back pain       Past Surgical History:  Procedure Laterality Date   FACIAL FRACTURE SURGERY     WISDOM TOOTH EXTRACTION      Social History:  Ambulatory  independently     reports that he has been smoking cigarettes. He has been smoking about 0.50 packs per day. He has never used smokeless tobacco. He reports current alcohol use. He reports that he does not use drugs.   Family History:   Family History  Problem Relation Age of Onset   Hypertension Mother    Heart attack Father    Cancer Other     Allergies: Allergies  Allergen Reactions   Doxycycline Hives     Prior to Admission medications   Medication Sig Start Date End Date Taking? Authorizing Provider  acetaminophen (TYLENOL) 500 MG tablet Take 1,500 mg by mouth every 6 (six) hours as needed for mild pain.   Yes [provider]  ibuprofen (ADVIL,MOTRIN) 200 MG tablet Take 400 mg by mouth every 4 (four) hours as needed for fever.   Yes [provider]  naproxen sodium (ALEVE) 220 MG tablet Take 220 mg by mouth 2 (two) times daily as needed (pain, fever).   Yes [provider]  penicillin v potassium (VEETID) 500 MG tablet Take 1 tablet (500 mg total) by mouth 3 (three) times daily. Patient not taking: Reported on 12/18/2018 08/27/18   Azalia Bilis, MD   Physical Exam: Blood pressure 118/88, pulse 95, temperature 99 F (37.2 C), temperature source Oral, resp.  rate 20, SpO2 98 %. 1. General:  in    Acute distress apeers overall uncomfortable   acutely ill -appearing 2. Psychological: Alert and   Oriented 3. Head/ENT:     Dry Mucous Membranes                          Head Non traumatic, neck supple                           Poor Dentition                          Neck fullness noted lower lip swelling noted      4. SKIN:   decreased Skin turgor,  Skin clean Dry and intact which would carry a rash on the dorsum of the hands one area on right foot one area on the back and one area on the left hip        5. Heart: Regular rate and rhythm no  Murmur, no Rub or gallop 6. Lungs:  Clear to auscultation bilaterally, no wheezes or crackles   7. Abdomen: Soft non-tender, Non distended  bowel sounds present 8. Lower extremities: no clubbing, cyanosis, no  edema 9. Neurologically Grossly intact, moving all 4 extremities equally   10. MSK: Normal range of motion reports joint tenderness particularly in the wrists  and fingers bilaterally   All other LABS:     Recent Labs  Lab 12/18/18 2201  WBC 17.5*  NEUTROABS 14.0*  HGB 14.7  HCT 44.3  MCV 87.7  PLT 247     Recent Labs  Lab 12/18/18 2201  NA 137  K 3.6  CL 105  CO2 23  GLUCOSE 106*  BUN 11  CREATININE 1.03  CALCIUM 8.9     Recent Labs  Lab 12/18/18 2201  AST 21  ALT 25  ALKPHOS 74  BILITOT 1.4*  PROT 7.6  ALBUMIN 3.9       Cultures: No results found for: SDES, SPECREQUEST, CULT, REPTSTATUS   Radiological Exams on Admission: Dg Chest 2 View  Result Date: 12/18/2018 CLINICAL DATA:  Shortness of breath, fever EXAM: CHEST - 2 VIEW COMPARISON:  06/20/2018 FINDINGS: Heart and mediastinal contours are within normal limits. No focal opacities or effusions. No acute bony abnormality. IMPRESSION: No active cardiopulmonary disease. Electronically Signed   By: Charlett Nose M.D.   On: 12/18/2018 22:35    Chart has been reviewed    Assessment/Plan  43 y.o. male with  no significant medical history      Admitted for angioedema possible poststreptococcal arthritis versus rheumatic fever  Present on Admission:  Streptococcal infection group A - treat with Unasyn  While NPO given neck swelling will obtain CT neck to evaluate for abscess   Angioedema - monitor in stepdown currently no evidence  Of airway obstruction but monitor carefully. If not improving will obtain ENT consult   Rash -allergic reaction at this point no evidence of anaphylactic shock anaphylaxis but will make sure has EpiPen at bedside order Pepcid IV and benadryl IV and steroids   Post-streptococcal reactive arthritis (HCC) - treat underlining strep infection, avoid PO for now will initiate Unasyn 3gm Q 6H And steroids  Check UA for presence of protein monitor creatinine for development of glomerulonephritis Obtain echogram for further evaluation of rheumatic fever at this point rash appears to be nontypical Obtain sed rate and CRP blood cultures   Other plan as per orders.  DVT prophylaxis:  SCD    Code Status:  FULL CODE   as per patient  I had personally discussed CODE STATUS with patient and family  Family Communication:   Family at  Bedside  plan of care was discussed with   , Wife  Disposition Plan:     To home once workup is complete and patient is stable                      Consults called:  Discussed with Elink would benefit from ID consult in AM   Admission status:  ED Disposition    None      Obs   Level of care         SDU tele indefinitely please discontinue once patient no longer qualifies  Precautions: NONE  Keith Swanson 12/19/2018, 1:03 AM    [facility]     after 2 AM please page floor coverage PA If 7AM-7PM, please contact the day team taking care of the patient using [email or web address]

## 2018-12-19 ENCOUNTER — Other Ambulatory Visit: Payer: Self-pay

## 2018-12-19 ENCOUNTER — Observation Stay (HOSPITAL_COMMUNITY): Payer: Self-pay

## 2018-12-19 ENCOUNTER — Encounter (HOSPITAL_COMMUNITY): Payer: Self-pay

## 2018-12-19 DIAGNOSIS — L270 Generalized skin eruption due to drugs and medicaments taken internally: Secondary | ICD-10-CM

## 2018-12-19 DIAGNOSIS — K047 Periapical abscess without sinus: Secondary | ICD-10-CM

## 2018-12-19 DIAGNOSIS — F17210 Nicotine dependence, cigarettes, uncomplicated: Secondary | ICD-10-CM

## 2018-12-19 DIAGNOSIS — M2550 Pain in unspecified joint: Secondary | ICD-10-CM | POA: Diagnosis present

## 2018-12-19 DIAGNOSIS — T783XXA Angioneurotic edema, initial encounter: Secondary | ICD-10-CM | POA: Diagnosis present

## 2018-12-19 DIAGNOSIS — R509 Fever, unspecified: Secondary | ICD-10-CM

## 2018-12-19 DIAGNOSIS — J020 Streptococcal pharyngitis: Secondary | ICD-10-CM

## 2018-12-19 DIAGNOSIS — B948 Sequelae of other specified infectious and parasitic diseases: Secondary | ICD-10-CM | POA: Diagnosis present

## 2018-12-19 DIAGNOSIS — R1310 Dysphagia, unspecified: Secondary | ICD-10-CM

## 2018-12-19 DIAGNOSIS — T738XXA Other effects of deprivation, initial encounter: Secondary | ICD-10-CM

## 2018-12-19 DIAGNOSIS — B950 Streptococcus, group A, as the cause of diseases classified elsewhere: Secondary | ICD-10-CM

## 2018-12-19 DIAGNOSIS — R21 Rash and other nonspecific skin eruption: Secondary | ICD-10-CM | POA: Diagnosis present

## 2018-12-19 DIAGNOSIS — M0230 Reiter's disease, unspecified site: Secondary | ICD-10-CM

## 2018-12-19 HISTORY — DX: Sequelae of other specified infectious and parasitic diseases: B94.8

## 2018-12-19 LAB — URINALYSIS, ROUTINE W REFLEX MICROSCOPIC
Bilirubin Urine: NEGATIVE
Glucose, UA: NEGATIVE mg/dL
Hgb urine dipstick: NEGATIVE
Ketones, ur: NEGATIVE mg/dL
LEUKOCYTE UA: NEGATIVE
Nitrite: NEGATIVE
Protein, ur: NEGATIVE mg/dL
SPECIFIC GRAVITY, URINE: 1.027 (ref 1.005–1.030)
pH: 5 (ref 5.0–8.0)

## 2018-12-19 LAB — CBC
HCT: 41.2 % (ref 39.0–52.0)
Hemoglobin: 13.5 g/dL (ref 13.0–17.0)
MCH: 29 pg (ref 26.0–34.0)
MCHC: 32.8 g/dL (ref 30.0–36.0)
MCV: 88.6 fL (ref 80.0–100.0)
Platelets: 224 10*3/uL (ref 150–400)
RBC: 4.65 MIL/uL (ref 4.22–5.81)
RDW: 12.2 % (ref 11.5–15.5)
WBC: 17.4 10*3/uL — ABNORMAL HIGH (ref 4.0–10.5)
nRBC: 0 % (ref 0.0–0.2)

## 2018-12-19 LAB — HIV ANTIBODY (ROUTINE TESTING W REFLEX): HIV Screen 4th Generation wRfx: NONREACTIVE

## 2018-12-19 LAB — COMPREHENSIVE METABOLIC PANEL
ALT: 24 U/L (ref 0–44)
AST: 18 U/L (ref 15–41)
Albumin: 3.7 g/dL (ref 3.5–5.0)
Alkaline Phosphatase: 66 U/L (ref 38–126)
Anion gap: 9 (ref 5–15)
BUN: 10 mg/dL (ref 6–20)
CO2: 21 mmol/L — ABNORMAL LOW (ref 22–32)
Calcium: 8.5 mg/dL — ABNORMAL LOW (ref 8.9–10.3)
Chloride: 108 mmol/L (ref 98–111)
Creatinine, Ser: 0.99 mg/dL (ref 0.61–1.24)
GFR calc Af Amer: >60 mL/min (ref >60)
GFR calc non Af Amer: >60 mL/min (ref >60)
Glucose, Bld: 133 mg/dL — ABNORMAL HIGH (ref 70–99)
Potassium: 4 mmol/L (ref 3.5–5.1)
SODIUM: 138 mmol/L (ref 135–145)
Total Bilirubin: 1.3 mg/dL — ABNORMAL HIGH (ref 0.3–1.2)
Total Protein: 6.9 g/dL (ref 6.5–8.1)

## 2018-12-19 LAB — ECHOCARDIOGRAM COMPLETE
Height: 72 in
Weight: 3291.03 oz

## 2018-12-19 LAB — C-REACTIVE PROTEIN: CRP: 6.9 mg/dL — ABNORMAL HIGH (ref <1.0)

## 2018-12-19 LAB — TSH: TSH: 0.455 u[IU]/mL (ref 0.350–4.500)

## 2018-12-19 LAB — MAGNESIUM: Magnesium: 1.8 mg/dL (ref 1.7–2.4)

## 2018-12-19 LAB — PHOSPHORUS: Phosphorus: 2.1 mg/dL — ABNORMAL LOW (ref 2.5–4.6)

## 2018-12-19 LAB — SEDIMENTATION RATE: Sed Rate: 2 mm/hr (ref 0–16)

## 2018-12-19 LAB — MRSA PCR SCREENING: MRSA by PCR: NEGATIVE

## 2018-12-19 MED ORDER — POTASSIUM PHOSPHATE MONOBASIC 500 MG PO TABS
500.0000 mg | ORAL_TABLET | Freq: Three times a day (TID) | ORAL | Status: DC
  Administered 2018-12-19 – 2018-12-20 (×3): 500 mg via ORAL
  Filled 2018-12-19 (×4): qty 1

## 2018-12-19 MED ORDER — ORAL CARE MOUTH RINSE
15.0000 mL | Freq: Two times a day (BID) | OROMUCOSAL | Status: DC
  Administered 2018-12-19 – 2018-12-21 (×4): 15 mL via OROMUCOSAL

## 2018-12-19 MED ORDER — FAMOTIDINE IN NACL 20-0.9 MG/50ML-% IV SOLN
20.0000 mg | Freq: Two times a day (BID) | INTRAVENOUS | Status: DC
  Administered 2018-12-19 – 2018-12-20 (×3): 20 mg via INTRAVENOUS
  Filled 2018-12-19 (×3): qty 50

## 2018-12-19 MED ORDER — ONDANSETRON HCL 4 MG/2ML IJ SOLN: 4.0000 mg | Freq: Four times a day (QID) | INTRAMUSCULAR | Status: DC | PRN

## 2018-12-19 MED ORDER — SODIUM CHLORIDE 0.9 % IV BOLUS
1000.0000 mL | Freq: Once | INTRAVENOUS | Status: AC
  Administered 2018-12-19: 1000 mL via INTRAVENOUS

## 2018-12-19 MED ORDER — DIPHENHYDRAMINE HCL 50 MG/ML IJ SOLN: 25.0000 mg | Freq: Four times a day (QID) | INTRAMUSCULAR | Status: DC | PRN

## 2018-12-19 MED ORDER — EPINEPHRINE 0.3 MG/0.3ML IJ SOAJ
0.3000 mg | INTRAMUSCULAR | Status: DC | PRN
  Filled 2018-12-19: qty 0.3

## 2018-12-19 MED ORDER — IPRATROPIUM-ALBUTEROL 0.5-2.5 (3) MG/3ML IN SOLN: 3.0000 mL | RESPIRATORY_TRACT | Status: DC | PRN

## 2018-12-19 MED ORDER — SODIUM CHLORIDE 0.9 % IV SOLN
3.0000 g | Freq: Four times a day (QID) | INTRAVENOUS | Status: DC
  Administered 2018-12-19 – 2018-12-20 (×5): 3 g via INTRAVENOUS
  Filled 2018-12-19 (×6): qty 3

## 2018-12-19 MED ORDER — ACETAMINOPHEN 650 MG RE SUPP: 650.0000 mg | Freq: Four times a day (QID) | RECTAL | Status: DC | PRN

## 2018-12-19 MED ORDER — SODIUM CHLORIDE 0.9 % IV SOLN
INTRAVENOUS | Status: DC
  Administered 2018-12-19 (×2): via INTRAVENOUS

## 2018-12-19 MED ORDER — ACETAMINOPHEN 325 MG PO TABS: 650.0000 mg | ORAL_TABLET | Freq: Four times a day (QID) | ORAL | Status: DC | PRN

## 2018-12-19 MED ORDER — METHYLPREDNISOLONE SODIUM SUCC 125 MG IJ SOLR
60.0000 mg | Freq: Two times a day (BID) | INTRAMUSCULAR | Status: DC
  Administered 2018-12-19 – 2018-12-20 (×3): 60 mg via INTRAVENOUS
  Filled 2018-12-19 (×3): qty 2

## 2018-12-19 MED ORDER — CHLORHEXIDINE GLUCONATE CLOTH 2 % EX PADS
6.0000 | MEDICATED_PAD | Freq: Every day | CUTANEOUS | Status: DC
  Administered 2018-12-19: 6 via TOPICAL

## 2018-12-19 MED ORDER — IOHEXOL 300 MG/ML  SOLN
75.0000 mL | Freq: Once | INTRAMUSCULAR | Status: AC | PRN
  Administered 2018-12-19: 75 mL via INTRAVENOUS

## 2018-12-19 MED ORDER — SODIUM CHLORIDE (PF) 0.9 % IJ SOLN
INTRAMUSCULAR | Status: AC
  Filled 2018-12-19: qty 50

## 2018-12-19 MED ORDER — ONDANSETRON HCL 4 MG PO TABS: 4.0000 mg | ORAL_TABLET | Freq: Four times a day (QID) | ORAL | Status: DC | PRN

## 2018-12-19 NOTE — ED Notes (Signed)
Admitting MD at bedside.

## 2018-12-19 NOTE — Progress Notes (Addendum)
PROGRESS NOTE  Peter Keyworth FOY:[id number] DOB: 06-24-1976 DOA: 12/18/2018 PCP: Patient, No Pcp Per   LOS: 0 days   Brief narrative: Keeon Zurn is a 43 y.o. male with no significant medical history presented to hospital with hives and facial swelling for which he took Benadryl.  It did not seem to help so he decided to come to the hospital.  He also complained of some subjective fever cough and weakness but denies any recent travel or   COVID 19 exposure.  He stated that he had a lower lip swelling and facial swelling with initial difficulty in swallowing.  Patient also endorsed having skin rash, rash over the palms and soles, and joint pain.  2 weeks ago he developed significant sore throat with white patches he took some Penicillin at home but not sure how much.  Since then his throat pain has improved. A few years ago he developed [facility] spotted fever at a dose of doxycycline and develop skin peeling over his hands and feet.  Subjective: Patient feels little hungry today, wants to try something to drink.  facial swelling has improved including the rash but still complains of tenderness over the palms and soles.  Assessment/Plan:  Active Problems:   Streptococcal infection group A   Angioedema   Rash   Joint pain   Post-streptococcal reactive arthritis (HCC)  Group A streptococcal tonsillopharyngitis on IV Unasyn due to facial/lip swelling angioedema.  Blood cultures are negative in less than 12 hrs.  Leukocytosis and neutrophilia on presentation.  Afebrile at this time.  Lactate was within normal limits.  Will monitor CBC.  Check 2D echocardiogram. CT of the neck showed the following findings.  1. Mild enlargement of the palatine and lingual tonsils, suggesting acute tonsillopharyngitis. 2. Bilateral reactive level 2 A cervical lymphadenopathy.  . Angioedema, urticarial rash, rash over the palms and soles-this has improved overnight with IV steroids and Benadryl.  Unsure etiology but  could be related to streptococcal infection.  Continue steroids Pepcid and Benadryl.  Will get infectious disease evaluation at this time due.  Spoke with Dr. Johnnye Sima infectious disease.  HIV nonreactive  . Post-streptococcal reactive arthritis.  Does have right wrist tenderness and swelling in bilateral knee with arthralgia.-On Unasyn at this time.  Infectious disease to follow.  Urinalysis was negative for proteins.  Renal function is normal.  CRP slightly elevated but ESR within normal limits..  Hypophosphatemia- will replace.   VTE Prophylaxis: SCD  Code Status: Full code  Family Communication: With the patient's family at bedside  Disposition Plan: Likely in 1 to 2 days.  We will get ID opinion.  We will transfer out of the stepdown unit today if patient remains stable.  We will change the patient's status to inpatient at this time due to need for IV antibiotics, infectious disease evaluation, further monitoring in the hospital.   Consultants:  Infectious disease Dr. Johnnye Sima consulted  Procedures:  None  Antibiotics: Anti-infectives (From admission, onward)   Start     Dose/Rate Route Frequency Ordered Stop   12/19/18 0600  Ampicillin-Sulbactam (UNASYN) 3 g in sodium chloride 0.9 % 100 mL IVPB     3 g 200 mL/hr over 30 Minutes Intravenous Every 6 hours 12/19/18 0107     12/18/18 2315  penicillin v potassium (VEETID) tablet 500 mg     500 mg Oral  Once 12/18/18 2314 12/19/18 0019      Objective: Vitals:   12/19/18 1100 12/19/18 1150  BP: 123/72  Pulse: 80   Resp: 16   Temp:  98.2 F (36.8 C)  SpO2: 96%     Intake/Output Summary (Last 24 hours) at 12/19/2018 1249 Last data filed at 12/19/2018 1139 Gross per 24 hour  Intake 2144.69 ml  Output 1580 ml  Net 564.69 ml   Filed Weights   12/19/18 0145  Weight: 93.3 kg   Body mass index is 27.9 kg/m.   Physical Exam: GENERAL: Patient is alert awake and oriented. Not in obvious distress. HENT: No scleral  pallor or icterus. Pupils equally reactive to light. Oral mucosa is moist without pharyngeal erythema..  Mild lip swelling noted.  No tongue swelling. NECK: is supple, no palpable thyroid enlargement.  Tender lymphadenopathy palpable. CHEST: Clear to auscultation. No crackles or wheezes. Non tender on palpation. Diminished breath sounds bilaterally. CVS: S1 and S2 heard, no murmur. Regular rate and rhythm. No pericardial rub. ABDOMEN: Soft, non-tender, bowel sounds are present. No palpable hepato-splenomegaly. EXTREMITIES: No edema. CNS: Cranial nerves are intact. No focal motor or sensory deficits. SKIN: warm and dry .  Erythematous macular tender rash noted over the palms and soles.  Data Review: I have personally reviewed the following laboratory data and studies,  CBC: Recent Labs  Lab 12/18/18 2201 12/19/18 0636  WBC 17.5* 17.4*  NEUTROABS 14.0*  --   HGB 14.7 13.5  HCT 44.3 41.2  MCV 87.7 88.6  PLT 247 093   Basic Metabolic Panel: Recent Labs  Lab 12/18/18 2201 12/19/18 0636  NA 137 138  K 3.6 4.0  CL 105 108  CO2 23 21*  GLUCOSE 106* 133*  BUN 11 10  CREATININE 1.03 0.99  CALCIUM 8.9 8.5*  MG  --  1.8  PHOS  --  2.1*   Liver Function Tests: Recent Labs  Lab 12/18/18 2201 12/19/18 0636  AST 21 18  ALT 25 24  ALKPHOS 74 66  BILITOT 1.4* 1.3*  PROT 7.6 6.9  ALBUMIN 3.9 3.7   Recent Labs  Lab 12/18/18 2201  LIPASE 39   No results for input(s): AMMONIA in the last 168 hours. Cardiac Enzymes: No results for input(s): CKTOTAL, CKMB, CKMBINDEX, TROPONINI in the last 168 hours. BNP (last 3 results) No results for input(s): BNP in the last 8760 hours.  ProBNP (last 3 results) No results for input(s): PROBNP in the last 8760 hours.  CBG: No results for input(s): GLUCAP in the last 168 hours. Recent Results (from the past 240 hour(s))  Group A Strep by PCR     Status: Abnormal   Collection Time: 12/18/18 10:10 PM  Result Value Ref Range Status    Group A Strep by PCR DETECTED (A) NOT DETECTED Final    Comment: Performed at [facility], [place] [address], [place], [place] [address]  Culture, blood (routine x 2)     Status: None (Preliminary result)   Collection Time: 12/18/18 10:10 PM  Result Value Ref Range Status   Specimen Description   Final    BLOOD LEFT ANTECUBITAL Performed at [facility] [address] [place], [place] [address]    Special Requests   Final    BOTTLES DRAWN AEROBIC AND ANAEROBIC Blood Culture adequate volume Performed at [facility] [address], [place], [place] [address]    Culture   Final    NO GROWTH < 12 HOURS Performed at [facility] [address] [place], [place] [address]    Report Status PENDING  Incomplete  Culture, blood (routine x 2)     Status: None (Preliminary result)   Collection Time: 12/18/18 10:11 PM  Result Value Ref Range Status   Specimen Description   Final    BLOOD RIGHT ANTECUBITAL Performed at [facility] [address] [place], [place] [address]    Special Requests   Final    BOTTLES DRAWN AEROBIC AND ANAEROBIC Blood Culture results may not be optimal due to an excessive volume of blood received in culture bottles Performed at [facility] [address], [place], [place] [address]    Culture   Final    NO GROWTH < 12 HOURS Performed at [facility] [address] [place], [place] [address]    Report Status PENDING  Incomplete  MRSA PCR Screening     Status: None   Collection Time: 12/19/18  1:52 AM  Result Value Ref Range Status   MRSA by PCR NEGATIVE NEGATIVE Final    Comment:        The GeneXpert MRSA Assay (FDA approved for NASAL specimens only), is one component of a comprehensive MRSA colonization surveillance program. It is not intended to diagnose MRSA infection nor to guide or monitor treatment for MRSA infections. Performed at  [facility], [place] [address], [place],  [address]      Studies: Dg Chest 2 View  Result Date: 12/18/2018 CLINICAL DATA:  Shortness of breath, fever EXAM: CHEST - 2 VIEW COMPARISON:  06/20/2018 FINDINGS: Heart and mediastinal contours are within normal limits. No focal opacities or effusions. No acute bony abnormality. IMPRESSION: No active cardiopulmonary disease. Electronically Signed   By: Rolm Baptise M.D.   On: 12/18/2018 22:35   Ct Soft Tissue Neck W Contrast  Result Date: 12/19/2018 CLINICAL DATA:  Sore throat and stridor EXAM: CT NECK WITH CONTRAST TECHNIQUE: Multidetector CT imaging of the neck was performed using the standard protocol following the bolus administration of intravenous contrast. CONTRAST:  79m OMNIPAQUE IOHEXOL 300 MG/ML  SOLN COMPARISON:  None. FINDINGS: Pharynx and larynx: There is mild hypertrophy of the palatine and lingual tonsils. Nasopharynx is normal. The larynx is normal. No retropharyngeal abnormality. Salivary glands: Normal Thyroid: Normal Lymph nodes: Bilateral level 2A nodes measure up to 11 mm. No lower cervical lymphadenopathy. Vascular: Normal. Limited intracranial: Normal Visualized orbits: Normal Mastoids and visualized paranasal sinuses: Retained secretions within both maxillary sinuses with mild mucosal thickening. Skeleton: No acute or aggressive process. Upper chest: Negative. Other: None. IMPRESSION: 1. Mild enlargement of the palatine and lingual tonsils, suggesting acute tonsillopharyngitis. 2. Bilateral reactive level 2 A cervical lymphadenopathy. Electronically Signed   By: KUlyses JarredM.D.   On: 12/19/2018 02:03    Scheduled Meds: . Chlorhexidine Gluconate Cloth  6 each Topical Daily  . mouth rinse  15 mL Mouth Rinse BID  . methylPREDNISolone (SOLU-MEDROL) injection  60 mg Intravenous Q12H  . sodium chloride (PF)        Continuous Infusions: . ampicillin-sulbactam (UNASYN) IV Stopped (12/19/18 1146)  . famotidine  (PEPCID) IV Stopped (12/19/18 05643     LFlora Lipps MD  [facility] 12/19/2018

## 2018-12-19 NOTE — Consult Note (Addendum)
[facility]    Date of Admission:  12/18/2018   Total days of antibiotics: 1        unasyn               Reason for Consult: pharyngitis, angioedema    Referring Provider: Pokharel   Assessment: Group A strep  Angioedema Dental space infection (L lower molar)  Plan: 1. Continue steroids  2. Continue unasyn 3. Out of ICU 4. Check HIV, RPR, GC/chlamydia  Comment Not clear if he had reaction for his strep infection (pt states he tested +in ED) or if this is a delayed reaction to his PEN that he took.  Would give him 7 days of unsayn --> augmentin Taper steroids per primary.  As far as etiologies, Arcaenobacter can look like this. EBV would classically give you rash after sore throat and PEN.  Will follow peripherally.   Thank you so much for this interesting consult,  Active Problems:   Streptococcal infection group A   Angioedema   Rash   Joint pain   Post-streptococcal reactive arthritis (HCC)   . Chlorhexidine Gluconate Cloth  6 each Topical Daily  . mouth rinse  15 mL Mouth Rinse BID  . methylPREDNISolone (SOLU-MEDROL) injection  60 mg Intravenous Q12H  . potassium phosphate (monobasic)  500 mg Oral TID WC & HS    HPI: Keith Swanson is a 43 y.o. male without PMHx who was seen 2 weeks ago for sore throat, fever, cough and weakness by his PCP. He also had lower lip swelling and dysphagia as well as rash over palms and soles, joint pain. He developed white spots in his throat and took PEN that he had at home for 5 days (completed ~ 1 week ago).   He was adm to hosp on 3-16 after developing hives that did not improve with benadryl. His CT neck showed "tonsilopharyngitis". He was started on unasyn.   Review of Systems: Review of Systems  Constitutional: Positive for chills, fever and malaise/fatigue.  HENT: Positive for sore throat.   Respiratory: Positive for cough.   Musculoskeletal: Positive for myalgias.  Please see HPI. All  other systems reviewed and negative.   Past Medical History:  Diagnosis Date  . Low back pain     Social History   Tobacco Use  . Smoking status: Current Every Day Smoker    Packs/day: 0.50    Types: Cigarettes  . Smokeless tobacco: Never Used  Substance Use Topics  . Alcohol use: Yes    Comment: occassionally  . Drug use: No    Family History  Problem Relation Age of Onset  . Hypertension Mother   . Heart attack Father   . Cancer Other      Medications:  Scheduled: . Chlorhexidine Gluconate Cloth  6 each Topical Daily  . mouth rinse  15 mL Mouth Rinse BID  . methylPREDNISolone (SOLU-MEDROL) injection  60 mg Intravenous Q12H  . potassium phosphate (monobasic)  500 mg Oral TID WC & HS    Abtx:  Anti-infectives (From admission, onward)   Start     Dose/Rate Route Frequency Ordered Stop   12/19/18 0600  Ampicillin-Sulbactam (UNASYN) 3 g in sodium chloride 0.9 % 100 mL IVPB     3 g 200 mL/hr over 30 Minutes Intravenous Every 6 hours 12/19/18 0107     12/18/18 2315  penicillin v potassium (VEETID) tablet 500 mg     500  mg Oral  Once 12/18/18 2314 12/19/18 0019        OBJECTIVE: Blood pressure 114/69, pulse 69, temperature 98.2 F (36.8 C), temperature source Oral, resp. rate (!) 23, height 6' (1.829 m), weight 93.3 kg, SpO2 96 %.  Physical Exam Constitutional:      General: He is not in acute distress.    Appearance: Normal appearance. He is not ill-appearing.  HENT:     Mouth/Throat:     Mouth: Mucous membranes are moist.     Pharynx: Oropharynx is clear. No oropharyngeal exudate.  Eyes:     Extraocular Movements: Extraocular movements intact.     Pupils: Pupils are equal, round, and reactive to light.  Neck:     Musculoskeletal: Normal range of motion and neck supple.  Cardiovascular:     Rate and Rhythm: Normal rate and regular rhythm.  Pulmonary:     Effort: Pulmonary effort is normal.     Breath sounds: Normal breath sounds.  Abdominal:      General: Bowel sounds are normal. There is no distension.     Palpations: Abdomen is soft.     Tenderness: There is no abdominal tenderness.  Musculoskeletal:     Right lower leg: No edema.     Left lower leg: No edema.  Skin:    General: Skin is warm and dry.     Findings: No erythema, lesion or rash.  Neurological:     General: No focal deficit present.     Mental Status: He is alert and oriented to person, place, and time.  Psychiatric:        Mood and Affect: Mood normal.     Lab Results Results for orders placed or performed during the hospital encounter of 12/18/18 (from the past 48 hour(s))  Comprehensive metabolic panel     Status: Abnormal   Collection Time: 12/18/18 10:01 PM  Result Value Ref Range   Sodium 137 135 - 145 mmol/L   Potassium 3.6 3.5 - 5.1 mmol/L   Chloride 105 98 - 111 mmol/L   CO2 23 22 - 32 mmol/L   Glucose, Bld 106 (H) 70 - 99 mg/dL   BUN 11 6 - 20 mg/dL   Creatinine, Ser 1.611.03 0.61 - 1.24 mg/dL   Calcium 8.9 8.9 - 09.610.3 mg/dL   Total Protein 7.6 6.5 - 8.1 g/dL   Albumin 3.9 3.5 - 5.0 g/dL   AST 21 15 - 41 U/L   ALT 25 0 - 44 U/L   Alkaline Phosphatase 74 38 - 126 U/L   Total Bilirubin 1.4 (H) 0.3 - 1.2 mg/dL   GFR calc non Af Amer >60 >60 mL/min   GFR calc Af Amer >60 >60 mL/min   Anion gap 9 5 - 15    Comment: Performed at [facility], [address] [place], [place] [address]  Lipase, blood     Status: None   Collection Time: 12/18/18 10:01 PM  Result Value Ref Range   Lipase 39 11 - 51 U/L    Comment: Performed at [facility], [address] [place], [place] [address]  CBC with Differential     Status: Abnormal   Collection Time: 12/18/18 10:01 PM  Result Value Ref Range   WBC 17.5 (H) 4.0 - 10.5 K/uL   RBC 5.05 4.22 - 5.81 MIL/uL   Hemoglobin 14.7 13.0 - 17.0 g/dL   HCT 14.744.3 82.939.0 - 56.252.0 %   MCV 87.7 80.0 - 100.0  fL   MCH 29.1 26.0 - 34.0 pg   MCHC 33.2 30.0 - 36.0 g/dL   RDW 13.0 86.5 -  78.4 %   Platelets 247 150 - 400 K/uL   nRBC 0.0 0.0 - 0.2 %   Neutrophils Relative % 79 %   Neutro Abs 14.0 (H) 1.7 - 7.7 K/uL   Lymphocytes Relative 12 %   Lymphs Abs 2.1 0.7 - 4.0 K/uL   Monocytes Relative 7 %   Monocytes Absolute 1.2 (H) 0.1 - 1.0 K/uL   Eosinophils Relative 1 %   Eosinophils Absolute 0.1 0.0 - 0.5 K/uL   Basophils Relative 0 %   Basophils Absolute 0.0 0.0 - 0.1 K/uL   Immature Granulocytes 1 %   Abs Immature Granulocytes 0.08 (H) 0.00 - 0.07 K/uL    Comment: Performed at [facility], [address], [place], [place] [address]  Lactic acid, plasma     Status: None   Collection Time: 12/18/18 10:01 PM  Result Value Ref Range   Lactic Acid, Venous 1.0 0.5 - 1.9 mmol/L    Comment: Performed at [facility], [address], [place], [place] [address]  Group A Strep by PCR     Status: Abnormal   Collection Time: 12/18/18 10:10 PM  Result Value Ref Range   Group A Strep by PCR DETECTED (A) NOT DETECTED    Comment: Performed at [facility], [address], [place], [place] [address]  Culture, blood (routine x 2)     Status: None (Preliminary result)   Collection Time: 12/18/18 10:10 PM  Result Value Ref Range   Specimen Description      BLOOD LEFT ANTECUBITAL Performed at [facility], [address], [place], [place] [address]    Special Requests      BOTTLES DRAWN AEROBIC AND ANAEROBIC Blood Culture adequate volume Performed at [facility], [address], [place], [place] [address]    Culture      NO GROWTH < 12 HOURS Performed at [facility], [address], [place], [place] [address]    Report Status PENDING   Influenza panel by PCR (type A & B)     Status: None   Collection Time: 12/18/18 10:10 PM  Result Value Ref Range   Influenza A By PCR NEGATIVE NEGATIVE   Influenza B By PCR NEGATIVE NEGATIVE    Comment: (NOTE) The Xpert Xpress Flu assay is  intended as an aid in the diagnosis of  influenza and should not be used as a sole basis for treatment.  This  assay is FDA approved for nasopharyngeal swab specimens only. Nasal  washings and aspirates are unacceptable for Xpert Xpress Flu testing. Performed at [facility], [address] [place], [place] [address]   Culture, blood (routine x 2)     Status: None (Preliminary result)   Collection Time: 12/18/18 10:11 PM  Result Value Ref Range   Specimen Description      BLOOD RIGHT ANTECUBITAL Performed at [facility], [address], [place], [place] [address]    Special Requests      BOTTLES DRAWN AEROBIC AND ANAEROBIC Blood Culture results may not be optimal due to an excessive volume of blood received in culture bottles Performed at [facility], [address] [place], [place] [address]    Culture      NO GROWTH < 12 HOURS Performed at [facility], [address], [place], [place] [address]    Report Status PENDING   Sedimentation rate     Status: None   Collection Time: 12/18/18 11:12 PM  Result Value Ref Range   Sed Rate 2 0 - 16 mm/hr    Comment: Performed at [facility], [address] [place], [place] [address]  Lactic acid, plasma     Status: None   Collection Time: 12/19/18 12:01 AM  Result Value Ref Range   Lactic Acid, Venous 1.0 0.5 - 1.9 mmol/L    Comment: Performed at [facility], [address], [place], [place] [address]  MRSA PCR Screening     Status: None   Collection Time: 12/19/18  1:52 AM  Result Value Ref Range   MRSA by PCR NEGATIVE NEGATIVE    Comment:        The GeneXpert MRSA Assay (FDA approved for NASAL specimens only), is one component of a comprehensive MRSA colonization surveillance program. It is not intended to diagnose MRSA infection nor to guide or monitor treatment for MRSA infections. Performed at [facility], [address] [place], [place] [address]   C-reactive protein     Status: Abnormal   Collection Time: 12/19/18  1:56 AM  Result Value Ref Range   CRP 6.9 (H) <1.0 mg/dL    Comment: Performed at [facility], [address], [place], [place] [address]  Urinalysis, Routine w reflex microscopic     Status: None   Collection Time: 12/19/18  2:22 AM  Result Value Ref Range   Color, Urine YELLOW YELLOW   APPearance CLEAR CLEAR   Specific Gravity, Urine 1.027 1.005 - 1.030   pH 5.0 5.0 - 8.0   Glucose, UA NEGATIVE NEGATIVE mg/dL   Hgb urine dipstick NEGATIVE NEGATIVE   Bilirubin Urine NEGATIVE NEGATIVE   Ketones, ur NEGATIVE NEGATIVE mg/dL   Protein, ur NEGATIVE NEGATIVE mg/dL   Nitrite NEGATIVE NEGATIVE   Leukocytes,Ua NEGATIVE NEGATIVE    Comment: Performed at [facility], [address], [place], [place] [address]  HIV antibody (Routine Testing)     Status: None   Collection Time: 12/19/18  6:36 AM  Result Value Ref Range   HIV Screen 4th Generation wRfx Non Reactive Non Reactive    Comment: (NOTE) Performed At: [facility] [address] [place], [place] [id number] Jolene Schimke MD [id number]   Magnesium     Status: None   Collection Time: 12/19/18  6:36 AM  Result Value Ref Range   Magnesium 1.8 1.7 - 2.4 mg/dL    Comment: Performed at [facility], [address] [place], [place] [address]  Phosphorus     Status: Abnormal   Collection Time: 12/19/18  6:36 AM  Result Value Ref Range   Phosphorus 2.1 (L) 2.5 - 4.6 mg/dL    Comment: Performed at [facility], [address] [place], [place] [address]  TSH     Status: None   Collection Time: 12/19/18  6:36 AM  Result Value Ref Range   TSH 0.455 0.350 - 4.500 uIU/mL    Comment: Performed by a 3rd Generation assay with a functional sensitivity of <=0.01 uIU/mL. Performed at [facility], [address] [place], [place] [address]   Comprehensive metabolic panel     Status: Abnormal   Collection Time: 12/19/18  6:36 AM  Result Value Ref Range  Sodium 138 135 - 145 mmol/L   Potassium 4.0 3.5 - 5.1 mmol/L   Chloride 108 98 - 111 mmol/L   CO2 21 (L) 22 - 32 mmol/L   Glucose, Bld 133 (H) 70 - 99 mg/dL   BUN 10 6 - 20 mg/dL   Creatinine, Ser 1.61 0.61 - 1.24 mg/dL   Calcium 8.5 (L) 8.9 - 10.3 mg/dL   Total Protein 6.9 6.5 - 8.1 g/dL   Albumin 3.7 3.5 - 5.0 g/dL   AST 18 15 - 41 U/L   ALT 24 0 - 44 U/L   Alkaline Phosphatase 66 38 - 126 U/L   Total Bilirubin 1.3 (H) 0.3 - 1.2 mg/dL   GFR calc non Af Amer >60 >60 mL/min   GFR calc Af Amer >60 >60 mL/min   Anion gap 9 5 - 15    Comment: Performed at [facility], [address], [place], [place] [address]  CBC     Status: Abnormal   Collection Time: 12/19/18  6:36 AM  Result Value Ref Range   WBC 17.4 (H) 4.0 - 10.5 K/uL   RBC 4.65 4.22 - 5.81 MIL/uL   Hemoglobin 13.5 13.0 - 17.0 g/dL   HCT 54.0 98.1 - 19.1 %   MCV 88.6 80.0 - 100.0 fL   MCH 29.0 26.0 - 34.0 pg   MCHC 32.8 30.0 - 36.0 g/dL   RDW 47.8 29.5 - 62.1 %   Platelets 224 150 - 400 K/uL   nRBC 0.0 0.0 - 0.2 %    Comment: Performed at [facility], [address], [place], [place] [address]      Component Value Date/Time   SDES  12/18/2018 2211    BLOOD RIGHT ANTECUBITAL Performed at [facility], [address] [place], [place] [address]    SPECREQUEST  12/18/2018 2211    BOTTLES DRAWN AEROBIC AND ANAEROBIC Blood Culture results may not be optimal due to an excessive volume of blood received in culture bottles Performed at [facility], [address], [place], [place] [address]    CULT  12/18/2018 2211    NO GROWTH < 12 HOURS Performed at [facility], [address], [place], [place] [address]    REPTSTATUS PENDING 12/18/2018 2211   Dg Chest 2 View  Result Date: 12/18/2018  CLINICAL DATA:  Shortness of breath, fever EXAM: CHEST - 2 VIEW COMPARISON:  06/20/2018 FINDINGS: Heart and mediastinal contours are within normal limits. No focal opacities or effusions. No acute bony abnormality. IMPRESSION: No active cardiopulmonary disease. Electronically Signed   By: Charlett Nose M.D.   On: 12/18/2018 22:35   Ct Soft Tissue Neck W Contrast  Result Date: 12/19/2018 CLINICAL DATA:  Sore throat and stridor EXAM: CT NECK WITH CONTRAST TECHNIQUE: Multidetector CT imaging of the neck was performed using the standard protocol following the bolus administration of intravenous contrast. CONTRAST:  75mL OMNIPAQUE IOHEXOL 300 MG/ML  SOLN COMPARISON:  None. FINDINGS: Pharynx and larynx: There is mild hypertrophy of the palatine and lingual tonsils. Nasopharynx is normal. The larynx is normal. No retropharyngeal abnormality. Salivary glands: Normal Thyroid: Normal Lymph nodes: Bilateral level 2A nodes measure up to 11 mm. No lower cervical lymphadenopathy. Vascular: Normal. Limited intracranial: Normal Visualized orbits: Normal Mastoids and visualized paranasal sinuses: Retained secretions within both maxillary sinuses with mild mucosal thickening. Skeleton: No acute or aggressive process. Upper chest: Negative. Other: None. IMPRESSION: 1. Mild enlargement of the palatine  and lingual tonsils, suggesting acute tonsillopharyngitis. 2. Bilateral reactive level 2 A cervical lymphadenopathy. Electronically Signed   By: Deatra Robinson M.D.   On: 12/19/2018 02:03   Recent Results (from the past 240 hour(s))  Group A Strep by PCR     Status: Abnormal   Collection Time: 12/18/18 10:10 PM  Result Value Ref Range Status   Group A Strep by PCR DETECTED (A) NOT DETECTED Final    Comment: Performed at [facility], [address], [place], [place] [address]  Culture, blood (routine x 2)     Status: None (Preliminary result)   Collection Time: 12/18/18 10:10 PM  Result Value Ref Range  Status   Specimen Description   Final    BLOOD LEFT ANTECUBITAL Performed at [facility], [address] [place], [place] [address]    Special Requests   Final    BOTTLES DRAWN AEROBIC AND ANAEROBIC Blood Culture adequate volume Performed at [facility], [address] [place], [place] [address]    Culture   Final    NO GROWTH < 12 HOURS Performed at [facility], [address] [place], [place] [address]    Report Status PENDING  Incomplete  Culture, blood (routine x 2)     Status: None (Preliminary result)   Collection Time: 12/18/18 10:11 PM  Result Value Ref Range Status   Specimen Description   Final    BLOOD RIGHT ANTECUBITAL Performed at [facility], [address] [place], [place] [address]    Special Requests   Final    BOTTLES DRAWN AEROBIC AND ANAEROBIC Blood Culture results may not be optimal due to an excessive volume of blood received in culture bottles Performed at [facility], [address], [place], [place] [address]    Culture   Final    NO GROWTH < 12 HOURS Performed at [facility], [address] [place], [place] [address]    Report Status PENDING  Incomplete  MRSA PCR Screening     Status: None   Collection Time: 12/19/18  1:52 AM  Result Value Ref Range Status   MRSA by PCR NEGATIVE NEGATIVE Final    Comment:        The GeneXpert MRSA Assay (FDA approved for NASAL specimens only), is one component of a comprehensive MRSA colonization surveillance program. It is not intended to diagnose MRSA infection nor to guide or monitor treatment for MRSA infections. Performed at [facility], [address], [place], [place] [address]     Microbiology: Recent Results (from the past 240 hour(s))  Group A Strep by PCR     Status: Abnormal   Collection Time: 12/18/18 10:10 PM  Result Value Ref Range Status   Group A Strep by PCR DETECTED (A)  NOT DETECTED Final    Comment: Performed at [facility], [address], [place], [place] [address]  Culture, blood (routine x 2)     Status: None (Preliminary result)   Collection Time: 12/18/18 10:10 PM  Result Value Ref Range Status   Specimen Description   Final    BLOOD LEFT ANTECUBITAL Performed at [facility], [address] [place], [place] [address]    Special Requests   Final    BOTTLES DRAWN AEROBIC AND ANAEROBIC Blood Culture adequate volume Performed at [facility], [address], [place], [place] [address]    Culture  Final    NO GROWTH < 12 HOURS Performed at [facility], [address] [place], [place] [address]    Report Status PENDING  Incomplete  Culture, blood (routine x 2)     Status: None (Preliminary result)   Collection Time: 12/18/18 10:11 PM  Result Value Ref Range Status   Specimen Description   Final    BLOOD RIGHT ANTECUBITAL Performed at [facility], [address], [place], [place] [address]    Special Requests   Final    BOTTLES DRAWN AEROBIC AND ANAEROBIC Blood Culture results may not be optimal due to an excessive volume of blood received in culture bottles Performed at [facility], [address], [place], [place] [address]    Culture   Final    NO GROWTH < 12 HOURS Performed at [facility], [address] [place], [place] [address]    Report Status PENDING  Incomplete  MRSA PCR Screening     Status: None   Collection Time: 12/19/18  1:52 AM  Result Value Ref Range Status   MRSA by PCR NEGATIVE NEGATIVE Final    Comment:        The GeneXpert MRSA Assay (FDA approved for NASAL specimens only), is one component of a comprehensive MRSA colonization surveillance program. It is not intended to diagnose MRSA infection nor to guide or monitor treatment for MRSA infections. Performed at [facility], [address], [place], [place] [address]     Radiographs and labs were personally reviewed by me.   Johny Sax, MD [facility] [phone] 12/19/2018, 3:30 PM

## 2018-12-19 NOTE — ED Notes (Signed)
ED TO INPATIENT HANDOFF REPORT  ED Nurse Name and Phone #: Maralyn Sago, RN [phone]  S Name/Age/Gender Keith Swanson 43 y.o. male Room/Bed: WA19/WA19  Code Status   Code Status: Not on file  Home/SNF/Other Home Patient oriented to: self Is this baseline? Yes   Triage Complete: Triage complete  Chief Complaint Rash, Fever  Triage Note Pt reports pruritic urticaria since early this am.  Has taken Benadryl 50mg  without relief.  He feels feverish.  He also endorses feeling fatigued with productive cough.  The cough started about a week ago with fever.  Denies any recent travel or poss exposure.  He also reports blurred vision.   Allergies Allergies  Allergen Reactions  . Doxycycline Hives    Level of Care/Admitting Diagnosis ED Disposition    ED Disposition Condition Comment   Admit  Hospital Area: [facility] [[id number]]  Level of Care: Stepdown [14]  Admit to SDU based on following criteria: Hemodynamic compromise or significant risk of instability:  Patient requiring short term acute titration and management of vasoactive drips, and invasive monitoring (i.e., CVP and Arterial line).  Diagnosis: Angioedema [026378]  Admitting Physician: Therisa Doyne [[id number]]  Attending Physician: Therisa Doyne [[id number]]  PT Class (Do Not Modify): Observation [104]  PT Acc Code (Do Not Modify): Observation [10022]       B Medical/Surgery History Past Medical History:  Diagnosis Date  . Low back pain    Past Surgical History:  Procedure Laterality Date  . FACIAL FRACTURE SURGERY    . WISDOM TOOTH EXTRACTION       A IV Location/Drains/Wounds Patient Lines/Drains/Airways Status   Active Line/Drains/Airways    Name:   Placement date:   Placement time:   Site:   Days:   Peripheral IV 12/18/18 Right Antecubital   12/18/18    2222    Antecubital   1          Intake/Output Last 24 hours No intake or output data in the 24 hours ending 12/19/18  0042  Labs/Imaging Results for orders placed or performed during the hospital encounter of 12/18/18 (from the past 48 hour(s))  Comprehensive metabolic panel     Status: Abnormal   Collection Time: 12/18/18 10:01 PM  Result Value Ref Range   Sodium 137 135 - 145 mmol/L   Potassium 3.6 3.5 - 5.1 mmol/L   Chloride 105 98 - 111 mmol/L   CO2 23 22 - 32 mmol/L   Glucose, Bld 106 (H) 70 - 99 mg/dL   BUN 11 6 - 20 mg/dL   Creatinine, Ser 5.88 0.61 - 1.24 mg/dL   Calcium 8.9 8.9 - 50.2 mg/dL   Total Protein 7.6 6.5 - 8.1 g/dL   Albumin 3.9 3.5 - 5.0 g/dL   AST 21 15 - 41 U/L   ALT 25 0 - 44 U/L   Alkaline Phosphatase 74 38 - 126 U/L   Total Bilirubin 1.4 (H) 0.3 - 1.2 mg/dL   GFR calc non Af Amer >60 >60 mL/min   GFR calc Af Amer >60 >60 mL/min   Anion gap 9 5 - 15    Comment: Performed at [facility], [address], [place], [place] [address]  Lipase, blood     Status: None   Collection Time: 12/18/18 10:01 PM  Result Value Ref Range   Lipase 39 11 - 51 U/L    Comment: Performed at [facility], [address], [place], [place] [address]  CBC with  Differential     Status: Abnormal   Collection Time: 12/18/18 10:01 PM  Result Value Ref Range   WBC 17.5 (H) 4.0 - 10.5 K/uL   RBC 5.05 4.22 - 5.81 MIL/uL   Hemoglobin 14.7 13.0 - 17.0 g/dL   HCT 79.8 92.1 - 19.4 %   MCV 87.7 80.0 - 100.0 fL   MCH 29.1 26.0 - 34.0 pg   MCHC 33.2 30.0 - 36.0 g/dL   RDW 17.4 08.1 - 44.8 %   Platelets 247 150 - 400 K/uL   nRBC 0.0 0.0 - 0.2 %   Neutrophils Relative % 79 %   Neutro Abs 14.0 (H) 1.7 - 7.7 K/uL   Lymphocytes Relative 12 %   Lymphs Abs 2.1 0.7 - 4.0 K/uL   Monocytes Relative 7 %   Monocytes Absolute 1.2 (H) 0.1 - 1.0 K/uL   Eosinophils Relative 1 %   Eosinophils Absolute 0.1 0.0 - 0.5 K/uL   Basophils Relative 0 %   Basophils Absolute 0.0 0.0 - 0.1 K/uL   Immature Granulocytes 1 %   Abs Immature Granulocytes 0.08 (H) 0.00 - 0.07 K/uL     Comment: Performed at [facility], [address], [place], [place] [address]  Lactic acid, plasma     Status: None   Collection Time: 12/18/18 10:01 PM  Result Value Ref Range   Lactic Acid, Venous 1.0 0.5 - 1.9 mmol/L    Comment: Performed at [facility], [address], [place], [place] [address]  Group A Strep by PCR     Status: Abnormal   Collection Time: 12/18/18 10:10 PM  Result Value Ref Range   Group A Strep by PCR DETECTED (A) NOT DETECTED    Comment: Performed at [facility], [address], [place], [place] [address]  Influenza panel by PCR (type A & B)     Status: None   Collection Time: 12/18/18 10:10 PM  Result Value Ref Range   Influenza A By PCR NEGATIVE NEGATIVE   Influenza B By PCR NEGATIVE NEGATIVE    Comment: (NOTE) The Xpert Xpress Flu assay is intended as an aid in the diagnosis of  influenza and should not be used as a sole basis for treatment.  This  assay is FDA approved for nasopharyngeal swab specimens only. Nasal  washings and aspirates are unacceptable for Xpert Xpress Flu testing. Performed at [facility], [address] [place], [place] [address]   Lactic acid, plasma     Status: None   Collection Time: 12/19/18 12:01 AM  Result Value Ref Range   Lactic Acid, Venous 1.0 0.5 - 1.9 mmol/L    Comment: Performed at [facility], [address] [place], [place] [address]   Dg Chest 2 View  Result Date: 12/18/2018 CLINICAL DATA:  Shortness of breath, fever EXAM: CHEST - 2 VIEW COMPARISON:  06/20/2018 FINDINGS: Heart and mediastinal contours are within normal limits. No focal opacities or effusions. No acute bony abnormality. IMPRESSION: No active cardiopulmonary disease. Electronically Signed   By: Charlett Nose M.D.   On: 12/18/2018 22:35    Pending Labs Unresulted Labs (From admission, onward)    Start     Ordered   12/18/18 2312  Sedimentation rate   ONCE - STAT,   STAT     12/18/18 2314   12/18/18 2312  C-reactive protein  Once,   STAT     12/18/18 2314   12/18/18 2202  Culture, blood (routine x 2)  BLOOD CULTURE X 2,   STAT    Question:  Patient immune status  Answer:  Normal   12/18/18 2201   12/18/18 2201  Urinalysis, Routine w reflex microscopic  ONCE - STAT,   STAT     12/18/18 2201          Vitals/Pain Today's Vitals   12/18/18 2114 12/18/18 2223  BP: 118/88   Pulse: 95   Resp: 20   Temp: 99 F (37.2 C)   TempSrc: Oral   SpO2: 98%   PainSc:  6     Isolation Precautions Droplet precaution  Medications Medications  methylPREDNISolone sodium succinate (SOLU-MEDROL) 125 mg/2 mL injection 60 mg (has no administration in time range)  famotidine (PEPCID) IVPB 20 mg premix (has no administration in time range)  ipratropium-albuterol (DUONEB) 0.5-2.5 (3) MG/3ML nebulizer solution 3 mL (has no administration in time range)  diphenhydrAMINE (BENADRYL) injection 25 mg (has no administration in time range)  sodium chloride 0.9 % bolus 1,000 mL (0 mLs Intravenous Stopped 12/19/18 0006)  penicillin v potassium (VEETID) tablet 500 mg (500 mg Oral Given 12/19/18 0019)  hydrOXYzine (ATARAX/VISTARIL) tablet 10 mg (10 mg Oral Given 12/19/18 0019)    Mobility walks Low fall risk   Focused Assessments Neuro Assessment Handoff:  Swallow screen pass? Yes          Neuro Assessment:   Neuro Checks:      Last Documented NIHSS Modified Score:   Has TPA been given? No If patient is a Neuro Trauma and patient is going to OR before floor call report to 4N Charge nurse: [phone] or [phone]     R Recommendations: See Admitting Provider Note  Report given to:   Additional Notes:

## 2018-12-19 NOTE — ED Notes (Signed)
Pt will be transported to 2W after CT

## 2018-12-19 NOTE — ED Notes (Signed)
Patient transported to CT 

## 2018-12-20 DIAGNOSIS — K047 Periapical abscess without sinus: Secondary | ICD-10-CM | POA: Diagnosis present

## 2018-12-20 DIAGNOSIS — T783XXA Angioneurotic edema, initial encounter: Principal | ICD-10-CM

## 2018-12-20 LAB — BASIC METABOLIC PANEL
Anion gap: 7 (ref 5–15)
BUN: 12 mg/dL (ref 6–20)
CO2: 22 mmol/L (ref 22–32)
Calcium: 9 mg/dL (ref 8.9–10.3)
Chloride: 107 mmol/L (ref 98–111)
Creatinine, Ser: 0.82 mg/dL (ref 0.61–1.24)
GFR calc Af Amer: >60 mL/min (ref >60)
GFR calc non Af Amer: >60 mL/min (ref >60)
Glucose, Bld: 172 mg/dL — ABNORMAL HIGH (ref 70–99)
Potassium: 4.5 mmol/L (ref 3.5–5.1)
Sodium: 136 mmol/L (ref 135–145)

## 2018-12-20 LAB — CBC
HCT: 39.5 % (ref 39.0–52.0)
Hemoglobin: 13.1 g/dL (ref 13.0–17.0)
MCH: 29.4 pg (ref 26.0–34.0)
MCHC: 33.2 g/dL (ref 30.0–36.0)
MCV: 88.6 fL (ref 80.0–100.0)
NRBC: 0 % (ref 0.0–0.2)
Platelets: 230 10*3/uL (ref 150–400)
RBC: 4.46 MIL/uL (ref 4.22–5.81)
RDW: 11.9 % (ref 11.5–15.5)
WBC: 20 10*3/uL — ABNORMAL HIGH (ref 4.0–10.5)

## 2018-12-20 LAB — RPR: RPR Ser Ql: NONREACTIVE

## 2018-12-20 LAB — MAGNESIUM: Magnesium: 1.9 mg/dL (ref 1.7–2.4)

## 2018-12-20 MED ORDER — PREDNISONE 50 MG PO TABS
60.0000 mg | ORAL_TABLET | Freq: Every day | ORAL | Status: DC
  Administered 2018-12-20 – 2018-12-21 (×2): 60 mg via ORAL
  Filled 2018-12-20 (×2): qty 1

## 2018-12-20 MED ORDER — AMOXICILLIN-POT CLAVULANATE 875-125 MG PO TABS
1.0000 | ORAL_TABLET | Freq: Two times a day (BID) | ORAL | Status: DC
  Administered 2018-12-20 – 2018-12-21 (×3): 1 via ORAL
  Filled 2018-12-20 (×3): qty 1

## 2018-12-20 NOTE — Progress Notes (Signed)
Consulted with IP to maintain PPE on pt. Pt continues to have intermittent coughing. SRP RN

## 2018-12-20 NOTE — Progress Notes (Addendum)
SATURATION QUALIFICATIONS:  Patient Saturations on Room Air at Rest = 99%  Patient Saturations on Room Air while Ambulating 98%  Patient Saturations on 0 Liters of oxygen while  Ambulating 98%  Please briefly explain why patient needs home oxygen: Pt sats maintained at 98-100% during all activity.

## 2018-12-20 NOTE — Progress Notes (Signed)
INFECTIOUS DISEASE PROGRESS NOTE  ID: Keith Swanson is a 43 y.o. male with  Active Problems:   Streptococcal infection group A   Angioedema   Rash   Joint pain   Post-streptococcal reactive arthritis (HCC)  Subjective: No complaints. Sore throat better.  Abtx:  Anti-infectives (From admission, onward)   Start     Dose/Rate Route Frequency Ordered Stop   12/20/18 1000  amoxicillin-clavulanate (AUGMENTIN) 875-125 MG per tablet 1 tablet    Note to Pharmacy:  Tolerating unasyn   1 tablet Oral Every 12 hours 12/20/18 0901 12/26/18 0959   12/19/18 0600  Ampicillin-Sulbactam (UNASYN) 3 g in sodium chloride 0.9 % 100 mL IVPB  Status:  Discontinued     3 g 200 mL/hr over 30 Minutes Intravenous Every 6 hours 12/19/18 0107 12/20/18 0901   12/18/18 2315  penicillin v potassium (VEETID) tablet 500 mg     500 mg Oral  Once 12/18/18 2314 12/19/18 0019      Medications:  Scheduled: . amoxicillin-clavulanate  1 tablet Oral Q12H  . mouth rinse  15 mL Mouth Rinse BID  . predniSONE  60 mg Oral Q breakfast    Objective: Vital signs in last 24 hours: Temp:  [98.1 F (36.7 C)-98.4 F (36.9 C)] 98.2 F (36.8 C) (03/18 1434) Pulse Rate:  [61-77] 77 (03/18 1434) Resp:  [16-20] 20 (03/18 1434) BP: (117-139)/(77-93) 139/87 (03/18 1434) SpO2:  [97 %-100 %] 97 % (03/18 1434)   General appearance: alert, cooperative and no distress Throat: lips, mucosa, and tongue normal; teeth and gums normal Resp: clear to auscultation bilaterally Cardio: regular rate and rhythm GI: normal findings: bowel sounds normal and soft, non-tender  Lab Results Recent Labs    12/19/18 0636 12/20/18 0309  WBC 17.4* 20.0*  HGB 13.5 13.1  HCT 41.2 39.5  NA 138 136  K 4.0 4.5  CL 108 107  CO2 21* 22  BUN 10 12  CREATININE 0.99 0.82   Liver Panel Recent Labs    12/18/18 2201 12/19/18 0636  PROT 7.6 6.9  ALBUMIN 3.9 3.7  AST 21 18  ALT 25 24  ALKPHOS 74 66  BILITOT 1.4* 1.3*   Sedimentation Rate  Recent Labs    12/18/18 2312  ESRSEDRATE 2   C-Reactive Protein Recent Labs    12/19/18 0156  CRP 6.9*    Microbiology: Recent Results (from the past 240 hour(s))  Group A Strep by PCR     Status: Abnormal   Collection Time: 12/18/18 10:10 PM  Result Value Ref Range Status   Group A Strep by PCR DETECTED (A) NOT DETECTED Final    Comment: Performed at [facility], [address] [place], [place] [address]  Culture, blood (routine x 2)     Status: None (Preliminary result)   Collection Time: 12/18/18 10:10 PM  Result Value Ref Range Status   Specimen Description   Final    BLOOD LEFT ANTECUBITAL Performed at [facility], [address], [place], [place] [address]    Special Requests   Final    BOTTLES DRAWN AEROBIC AND ANAEROBIC Blood Culture adequate volume Performed at [facility], [address], [place], [place] [address]    Culture   Final    NO GROWTH 2 DAYS Performed at [facility], [address], [place], [place] [address]    Report Status PENDING  Incomplete  Culture, blood (routine x 2)     Status:  None (Preliminary result)   Collection Time: 12/18/18 10:11 PM  Result Value Ref Range Status   Specimen Description   Final    BLOOD RIGHT ANTECUBITAL Performed at [facility], [address] [place], [place] [address]    Special Requests   Final    BOTTLES DRAWN AEROBIC AND ANAEROBIC Blood Culture results may not be optimal due to an excessive volume of blood received in culture bottles Performed at [facility], [address] [place], [place] [address]    Culture   Final    NO GROWTH 2 DAYS Performed at [facility], [address] [place], [place] [address]    Report Status PENDING  Incomplete  MRSA PCR Screening     Status: None   Collection Time: 12/19/18  1:52 AM  Result Value Ref Range Status   MRSA by PCR NEGATIVE NEGATIVE Final     Comment:        The GeneXpert MRSA Assay (FDA approved for NASAL specimens only), is one component of a comprehensive MRSA colonization surveillance program. It is not intended to diagnose MRSA infection nor to guide or monitor treatment for MRSA infections. Performed at [facility], [address], [place], [place] [address]     Studies/Results: Dg Chest 2 View  Result Date: 12/18/2018 CLINICAL DATA:  Shortness of breath, fever EXAM: CHEST - 2 VIEW COMPARISON:  06/20/2018 FINDINGS: Heart and mediastinal contours are within normal limits. No focal opacities or effusions. No acute bony abnormality. IMPRESSION: No active cardiopulmonary disease. Electronically Signed   By: Charlett Nose M.D.   On: 12/18/2018 22:35   Ct Soft Tissue Neck W Contrast  Result Date: 12/19/2018 CLINICAL DATA:  Sore throat and stridor EXAM: CT NECK WITH CONTRAST TECHNIQUE: Multidetector CT imaging of the neck was performed using the standard protocol following the bolus administration of intravenous contrast. CONTRAST:  31mL OMNIPAQUE IOHEXOL 300 MG/ML  SOLN COMPARISON:  None. FINDINGS: Pharynx and larynx: There is mild hypertrophy of the palatine and lingual tonsils. Nasopharynx is normal. The larynx is normal. No retropharyngeal abnormality. Salivary glands: Normal Thyroid: Normal Lymph nodes: Bilateral level 2A nodes measure up to 11 mm. No lower cervical lymphadenopathy. Vascular: Normal. Limited intracranial: Normal Visualized orbits: Normal Mastoids and visualized paranasal sinuses: Retained secretions within both maxillary sinuses with mild mucosal thickening. Skeleton: No acute or aggressive process. Upper chest: Negative. Other: None. IMPRESSION: 1. Mild enlargement of the palatine and lingual tonsils, suggesting acute tonsillopharyngitis. 2. Bilateral reactive level 2 A cervical lymphadenopathy. Electronically Signed   By: Deatra Robinson M.D.   On: 12/19/2018 02:03     Assessment/Plan:  Group A strep  Angioedema Dental space infection (L lower molar)  Total days of antibiotics: 2 unsayn  Will change to augmentin 5 more days.  HIV and RPR (-).  Dental f/u at d/c PCP f/u.  Available as needed.          Johny Sax MD, FACP Infectious Diseases (pager) [phone] [email or web address] 12/20/2018, 7:09 PM  LOS: 1 day

## 2018-12-20 NOTE — Evaluation (Signed)
Physical Therapy Evaluation-1x Patient Details Name: Roben Nikolas MRN: [id number] DOB: [birth year] Today's Date: 12/20/2018   History of Present Illness  43 yo male admitted with strep infection, angioedema  Clinical Impression  On eval, pt was Ind with mobility. He walked ~500 feet around the unit. No PT needs. 1x eval.     Follow Up Recommendations No PT follow up    Equipment Recommendations  None recommended by PT    Recommendations for Other Services       Precautions / Restrictions Restrictions Weight Bearing Restrictions: No      Mobility  Bed Mobility Overal bed mobility: Independent                Transfers Overall transfer level: Independent                  Ambulation/Gait Ambulation/Gait assistance: Independent Gait Distance (Feet): 500 Feet Assistive device: None Gait Pattern/deviations: WFL(Within Functional Limits)        Stairs            Wheelchair Mobility    Modified Rankin (Stroke Patients Only)       Balance Overall balance assessment: Independent                                           Pertinent Vitals/Pain Pain Assessment: No/denies pain    Home Living Family/patient expects to be discharged to:: Private residence               Home Equipment: None      Prior Function Level of Independence: Independent               Hand Dominance        Extremity/Trunk Assessment   Upper Extremity Assessment Upper Extremity Assessment: Overall WFL for tasks assessed    Lower Extremity Assessment Lower Extremity Assessment: Overall WFL for tasks assessed    Cervical / Trunk Assessment Cervical / Trunk Assessment: Normal  Communication   Communication: No difficulties  Cognition Arousal/Alertness: Awake/alert Behavior During Therapy: WFL for tasks assessed/performed Overall Cognitive Status: Within Functional Limits for tasks assessed                                        General Comments      Exercises     Assessment/Plan    PT Assessment Patent does not need any further PT services  PT Problem List         PT Treatment Interventions      PT Goals (Current goals can be found in the Care Plan section)  Acute Rehab PT Goals PT Goal Formulation: All assessment and education complete, DC therapy    Frequency     Barriers to discharge        Co-evaluation               AM-PAC PT "6 Clicks" Mobility  Outcome Measure Help needed turning from your back to your side while in a flat bed without using bedrails?: None Help needed moving from lying on your back to sitting on the side of a flat bed without using bedrails?: None Help needed moving to and from a bed to a chair (including a wheelchair)?: None Help needed standing up from a chair using your arms (e.g.,  wheelchair or bedside chair)?: None Help needed to walk in hospital room?: None Help needed climbing 3-5 steps with a railing? : None 6 Click Score: 24    End of Session   Activity Tolerance: Patient tolerated treatment well Patient left: with family/visitor present(up mobilizing in room)        Time: 1400-1409 PT Time Calculation (min) (ACUTE ONLY): 9 min   Charges:   PT Evaluation $PT Eval Low Complexity: 1 Low           Rebeca Alert, PT Acute Rehabilitation Services Pager: [phone] Office: [phone]

## 2018-12-20 NOTE — Progress Notes (Signed)
PROGRESS NOTE  Jean Skow OFB:[id number] DOB: 03-19-1976 DOA: 12/18/2018 PCP: Patient, No Pcp Per   LOS: 1 day   Brief narrative: Salem Lembke is a 43 y.o. male with no significant medical history presented to hospital with hives and facial swelling for which he took Benadryl.  It did not seem to help so he decided to come to the hospital.  He also complained of some subjective fever cough and weakness but denies any recent travel or   COVID 19 exposure.  He stated that he had a lower lip swelling and facial swelling with initial difficulty in swallowing.  Patient also endorsed having skin rash, rash over the palms and soles, and joint pain.  2 weeks ago he developed significant sore throat with white patches he took some Penicillin at home but not sure how much.  Since then his throat pain has improved. A few years ago he developed [facility] spotted fever at a dose of doxycycline and develop skin peeling over his hands and feet.  Subjective: Patient seen and examined at bedside.  Complains of some dyspnea on ambulation.  Started on supplemental oxygen overnight.  Facial swelling and rash has resolved.  No other complaints at this time.  Denies headache, no fever/chills/night sweats, no cough/congestion, no nausea/vomiting/diarrhea, no chest pain, no palpitations, no abdominal pain, no issues with bowel/bladder function, no paresthesias.  No acute events overnight per nursing staff.  Assessment/Plan:  Active Problems:   Streptococcal infection group A   Angioedema   Rash   Joint pain   Post-streptococcal reactive arthritis (HCC)  Group A streptococcal tonsillopharyngitis  Patient recently diagnosed with group A strep in the ED, started on penicillin.  Complicated by angioedema and rash.  Infectious disease consulted and followed during hospital course.  Started on IV Unasyn due to facial/lip swelling angioedema.  Afebrile, lactic acid within normal limits.  CT neck notable for mild  enlargement of the palatine and lingual tonsils suggesting acute tonsillopharyngitis, bilateral reactive to a cervical lymphadenopathy. --Blood cultures are negative x2 days --2D echocardiogram with EF 60-65% and no other acute findings  --will transition IV Unasyn to Augmentin today, will continue to observe inpatient to ensure no reaction to the antibiotic change  Angioedema, urticarial rash, rash over the palms and soles Etiology could be related to penicillin versus streptococcal infection.  Started on IV steroids, Pepcid and Benadryl.  HIV and RPR nonreactive.  Symptoms now have resolved. --Transition IV steroids to prednisone 60 mg p.o. daily --Continue Pepcid and as needed Benadryl --Continue to monitor overnight now with antibiotic change as above  Post-streptococcal reactive arthritis.   Patient complained of right wrist tenderness and swelling in bilateral knee with arthralgia.-On Unasyn at this time.  Infectious disease to follow.  Urinalysis was negative for proteins.  Renal function is normal.  CRP slightly elevated but ESR within normal limits.  HIV and RPR were negative. --Improving  Dyspnea Deconditioning Patient reports dyspnea on ambulation to the bathroom last night.  Was started on supplemental oxygen by nursing staff.  No history of lung or cardiac disease. --6-minute walk test to assess O2 needs --PT evaluation for discharge planning   VTE Prophylaxis: SCD Code Status: Full code Family Communication: With the patient's family at bedside Disposition Plan: Likely home in 1 to 2 days.   Consultants:  Infectious Disease Dr. Johnnye Sima, MD  Procedures:  None  Antibiotics: Anti-infectives (From admission, onward)   Start     Dose/Rate Route Frequency Ordered Stop   12/20/18  1000  amoxicillin-clavulanate (AUGMENTIN) 875-125 MG per tablet 1 tablet    Note to Pharmacy:  Tolerating unasyn   1 tablet Oral Every 12 hours 12/20/18 0901 12/26/18 0959   12/19/18 0600   Ampicillin-Sulbactam (UNASYN) 3 g in sodium chloride 0.9 % 100 mL IVPB  Status:  Discontinued     3 g 200 mL/hr over 30 Minutes Intravenous Every 6 hours 12/19/18 0107 12/20/18 0901   12/18/18 2315  penicillin v potassium (VEETID) tablet 500 mg     500 mg Oral  Once 12/18/18 2314 12/19/18 0019      Objective: Vitals:   12/19/18 2012 12/20/18 0535  BP: (!) 136/93 117/77  Pulse: 62 61  Resp: 20 16  Temp: 98.1 F (36.7 C) 98.4 F (36.9 C)  SpO2: 100% 97%    Intake/Output Summary (Last 24 hours) at 12/20/2018 1158 Last data filed at 12/20/2018 0600 Gross per 24 hour  Intake 1628.1 ml  Output 1375 ml  Net 253.1 ml   Filed Weights   12/19/18 0145  Weight: 93.3 kg   Body mass index is 27.9 kg/m.   Physical Exam: GENERAL: Patient is alert awake and oriented. Not in obvious distress. HENT: No scleral pallor or icterus. Pupils equally reactive to light. Oral mucosa is moist without pharyngeal erythema. No tongue swelling.  Lips appear nonswollen. NECK: is supple, no palpable thyroid enlargement.  Tender lymphadenopathy palpable. CHEST: Clear to auscultation. No crackles or wheezes. Non tender on palpation. Diminished breath sounds bilaterally. CVS: S1 and S2 heard, no murmur. Regular rate and rhythm. No pericardial rub. ABDOMEN: Soft, non-tender, bowel sounds are present. No palpable hepato-splenomegaly. EXTREMITIES: No edema. CNS: Cranial nerves are intact. No focal motor or sensory deficits. SKIN: warm and dry .  Erythematous macular tender rash noted over the palms and soles.  Data Review: I have personally reviewed the following laboratory data and studies,  CBC: Recent Labs  Lab 12/18/18 2201 12/19/18 0636 12/20/18 0309  WBC 17.5* 17.4* 20.0*  NEUTROABS 14.0*  --   --   HGB 14.7 13.5 13.1  HCT 44.3 41.2 39.5  MCV 87.7 88.6 88.6  PLT 247 224 106   Basic Metabolic Panel: Recent Labs  Lab 12/18/18 2201 12/19/18 0636 12/20/18 0309  NA 137 138 136  K 3.6 4.0  4.5  CL 105 108 107  CO2 23 21* 22  GLUCOSE 106* 133* 172*  BUN _0 CREATININE 1.03 0.99 0.82  CALCIUM 8.9 8.5* 9.0  MG  --  1.8 1.9  PHOS  --  2.1*  --    Liver Function Tests: Recent Labs  Lab 12/18/18 2201 12/19/18 0636  AST 21 18  ALT 25 24  ALKPHOS 74 66  BILITOT 1.4* 1.3*  PROT 7.6 6.9  ALBUMIN 3.9 3.7   Recent Labs  Lab 12/18/18 2201  LIPASE 39   No results for input(s): AMMONIA in the last 168 hours. Cardiac Enzymes: No results for input(s): CKTOTAL, CKMB, CKMBINDEX, TROPONINI in the last 168 hours. BNP (last 3 results) No results for input(s): BNP in the last 8760 hours.  ProBNP (last 3 results) No results for input(s): PROBNP in the last 8760 hours.  CBG: No results for input(s): GLUCAP in the last 168 hours. Recent Results (from the past 240 hour(s))  Group A Strep by PCR     Status: Abnormal   Collection Time: 12/18/18 10:10 PM  Result Value Ref Range Status   Group A Strep by PCR DETECTED (A) NOT  DETECTED Final    Comment: Performed at [facility], [place] [address], [place], [place] [address]  Culture, blood (routine x 2)     Status: None (Preliminary result)   Collection Time: 12/18/18 10:10 PM  Result Value Ref Range Status   Specimen Description   Final    BLOOD LEFT ANTECUBITAL Performed at [place] [address] [place], [place] [address]    Special Requests   Final    BOTTLES DRAWN AEROBIC AND ANAEROBIC Blood Culture adequate volume Performed at [place] [address], [place], [place] [address]    Culture   Final    NO GROWTH 2 DAYS Performed at [place] [address] [place], [place] [address]    Report Status PENDING  Incomplete  Culture, blood (routine x 2)     Status: None (Preliminary result)   Collection Time: 12/18/18 10:11 PM  Result Value Ref Range Status   Specimen Description   Final    BLOOD RIGHT ANTECUBITAL Performed at [place] [address], [place], [place] [address]    Special Requests   Final    BOTTLES DRAWN AEROBIC AND ANAEROBIC Blood Culture results may not be optimal due to an excessive volume of blood received in culture bottles Performed at [place] [address] [place], [place] [address]    Culture   Final    NO GROWTH 2 DAYS Performed at [facility] [address] [place], [place] [address]    Report Status PENDING  Incomplete  MRSA PCR Screening     Status: None   Collection Time: 12/19/18  1:52 AM  Result Value Ref Range Status   MRSA by PCR NEGATIVE NEGATIVE Final    Comment:        The GeneXpert MRSA Assay (FDA approved for NASAL specimens only), is one component of a comprehensive MRSA colonization surveillance program. It is not intended to diagnose MRSA infection nor to guide or monitor treatment for MRSA infections. Performed at [facility], [place] [address] [place], [place] [address]      Studies: Dg Chest 2 View  Result Date: 12/18/2018 CLINICAL DATA:  Shortness of breath, fever EXAM: CHEST - 2 VIEW COMPARISON:  06/20/2018 FINDINGS: Heart and mediastinal contours are within normal limits. No focal opacities or effusions. No acute bony abnormality. IMPRESSION: No active cardiopulmonary disease. Electronically Signed   By: Rolm Baptise M.D.   On: 12/18/2018 22:35   Ct Soft Tissue Neck W Contrast  Result Date: 12/19/2018 CLINICAL DATA:  Sore throat and stridor EXAM: CT NECK WITH CONTRAST TECHNIQUE: Multidetector CT imaging of the neck was performed using the standard protocol following the bolus administration of intravenous contrast. CONTRAST:  73m OMNIPAQUE IOHEXOL 300 MG/ML  SOLN COMPARISON:  None. FINDINGS: Pharynx and larynx: There is mild hypertrophy of the palatine and lingual tonsils. Nasopharynx is normal. The larynx is normal. No retropharyngeal abnormality. Salivary glands: Normal Thyroid: Normal  Lymph nodes: Bilateral level 2A nodes measure up to 11 mm. No lower cervical lymphadenopathy. Vascular: Normal. Limited intracranial: Normal Visualized orbits: Normal Mastoids and visualized paranasal sinuses: Retained secretions within both maxillary sinuses with mild mucosal thickening. Skeleton: No acute or aggressive process. Upper chest: Negative. Other: None. IMPRESSION: 1. Mild enlargement of the palatine and lingual tonsils, suggesting acute tonsillopharyngitis. 2. Bilateral reactive level 2 A cervical lymphadenopathy. Electronically Signed   By: KCletus GashD.  On: 12/19/2018 02:03    Scheduled Meds: . amoxicillin-clavulanate  1 tablet Oral Q12H  . mouth rinse  15 mL Mouth Rinse BID  . predniSONE  60 mg Oral Q breakfast    Continuous Infusions:    Zula Hovsepian J [place] DO  [facility] Pager [phone]   If 7PM-7AM, please contact night-coverage [email or web address] Password [facility] 12/20/2018

## 2018-12-21 DIAGNOSIS — M2550 Pain in unspecified joint: Secondary | ICD-10-CM

## 2018-12-21 LAB — CBC
HCT: 38.2 % — ABNORMAL LOW (ref 39.0–52.0)
Hemoglobin: 12.4 g/dL — ABNORMAL LOW (ref 13.0–17.0)
MCH: 29.1 pg (ref 26.0–34.0)
MCHC: 32.5 g/dL (ref 30.0–36.0)
MCV: 89.7 fL (ref 80.0–100.0)
Platelets: 231 10*3/uL (ref 150–400)
RBC: 4.26 MIL/uL (ref 4.22–5.81)
RDW: 12 % (ref 11.5–15.5)
WBC: 20.2 10*3/uL — ABNORMAL HIGH (ref 4.0–10.5)
nRBC: 0 % (ref 0.0–0.2)

## 2018-12-21 LAB — BASIC METABOLIC PANEL
Anion gap: 9 (ref 5–15)
BUN: 16 mg/dL (ref 6–20)
CALCIUM: 8.9 mg/dL (ref 8.9–10.3)
CO2: 24 mmol/L (ref 22–32)
Chloride: 105 mmol/L (ref 98–111)
Creatinine, Ser: 0.89 mg/dL (ref 0.61–1.24)
GFR calc Af Amer: >60 mL/min (ref >60)
GFR calc non Af Amer: >60 mL/min (ref >60)
Glucose, Bld: 111 mg/dL — ABNORMAL HIGH (ref 70–99)
Potassium: 3.8 mmol/L (ref 3.5–5.1)
Sodium: 138 mmol/L (ref 135–145)

## 2018-12-21 MED ORDER — PREDNISONE 5 MG PO TABS: ORAL_TABLET | ORAL | 0 refills | Status: DC

## 2018-12-21 MED ORDER — EPINEPHRINE 0.3 MG/0.3ML IJ SOAJ: 0.3000 mg | INTRAMUSCULAR | 0 refills | Status: DC | PRN

## 2018-12-21 MED ORDER — AMOXICILLIN-POT CLAVULANATE 875-125 MG PO TABS: 1.0000 | ORAL_TABLET | Freq: Two times a day (BID) | ORAL | 0 refills | Status: AC

## 2018-12-21 MED ORDER — PREDNISONE 5 MG PO TABS: ORAL_TABLET | ORAL | 0 refills | Status: AC

## 2018-12-21 MED ORDER — AMOXICILLIN-POT CLAVULANATE 875-125 MG PO TABS: 1.0000 | ORAL_TABLET | Freq: Two times a day (BID) | ORAL | 0 refills | Status: DC

## 2018-12-21 NOTE — Discharge Instructions (Signed)
Angioedema Angioedema is the sudden swelling of tissue in the body. Angioedema can affect any part of the body, but it most often affects the deeper parts of the skin, causing red, itchy patches (hives) to appear over the affected area. It often begins during the night and is found in the morning. Depending on the cause, angioedema may happen:  Only once.  Several times. It may come back in unpredictable patterns.  Repeatedly for several years. Over time, it may gradually stop coming back. Angioedema can be life-threatening if it affects the air passages that you breathe through. What are the causes? This condition may be caused by:  Foods, such as milk, eggs, shellfish, wheat, or nuts.  Certain medicines, such as ACE inhibitors, antibiotics, nonsteroidal anti-inflammatory drugs, birth control pills, or dyes used in X-rays.  Insect stings.  Infections. Angioedema can be inherited, and episodes can be triggered by:  Mild injury.  Dental work.  Surgery.  Stress.  Sudden changes in temperature.  Exercise. In some cases, the cause of this condition is not known. What are the signs or symptoms? Symptoms of this condition depend on where the swelling happens. Symptoms may include:  Swollen skin.  Red, itchy patches of skin (hives).  Redness in the affected area.  Pain in the affected area.  Swollen lips or tongue.  Wheezing.  Breathing problems.  If your internal organs are involved, symptoms may also include:  Nausea.  Abdominal pain.  Vomiting.  Difficulty swallowing.  Difficulty passing urine. How is this diagnosed? This condition may be diagnosed based on:  An exam of the affected area.  Your medical history.  Whether anyone in your family has had this condition before.  A review of any medicines you have been taking.  Tests, including: ? Allergy skin tests to see if the condition was caused by an allergic reaction. ? Blood tests to see if the  condition was caused by a gene. ? Tests to check for underlying diseases that could cause the condition. How is this treated? Treatment for this condition depends on the cause. It may involve any of the following:  If something triggered the condition, making changes to keep it from triggering the condition again.  If the condition affects your breathing, having tubes placed in your airway to keep it open.  Taking medicines to treat symptoms or prevent future episodes. These may include: ? Antihistamines. ? Epinephrine injections. ? Steroids. If your condition is severe, you may need to be treated at the hospital. Angioedema usually gets better in 24-48 hours. Follow these instructions at home:  Take over-the-counter and prescription medicines only as told by your health care provider.  If you were given medicines for emergency allergy treatment, always carry them with you.  Wear a medical bracelet as told by your health care provider.  If something triggers your condition, avoid the trigger, if possible.  If your condition is inherited and you are thinking about having children, talk to your health care provider. It is important to discuss the risks of passing on the condition to your children. Contact a health care provider if:  You have repeated episodes of angioedema.  Episodes of angioedema start to happen more often than they used to, even after you take steps to prevent them.  You have episodes of angioedema that are more severe than they have been before, even after you take steps to prevent them.  You are thinking about having children. Get help right away if:  You  have severe swelling of your mouth, tongue, or lips.  You have trouble breathing.  You have trouble swallowing.  You faint. This information is not intended to replace advice given to you by your health care provider. Make sure you discuss any questions you have with your health care provider. Document  Released: 11/29/2001 Document Revised: 04/17/2016 Document Reviewed: 03/30/2016 Elsevier Interactive Patient Education  2019 [employer] Inc.  Dental Caries  Dental caries are spots of decay (cavities) in teeth. They are in the outer layer of your tooth (enamel). Treat them as soon as you can. If they are not treated, they can spread decay and lead to painful infection. Follow these instructions at home: General instructions  Take good care of your mouth and teeth. This keeps them healthy. ? Brush your teeth 2 times a day. Use toothpaste with fluoride in it. ? Floss your teeth once a day.  If your dentist prescribed an antibiotic medicine to treat an infection, take it as told. Do not stop taking the antibiotic even if your condition gets better.  Keep all follow-up visits as told by your dentist. This is important. This includes all cleanings. Preventing dental caries   Brush your teeth every morning and night. Use fluoride toothpaste.  Get regular dental cleanings.  If you are at risk of dental caries. ? Wash your mouth with prescription mouthwash (chlorhexidine). ? Put topical fluoride on your teeth.  Drink water with fluoride in it.  Drink water instead of sugary drinks.  Eat healthy meals and snacks. Contact a doctor if:  You have symptoms of tooth decay. Summary  Dental caries are spots of decay (cavities) in teeth. They are in the outer layer of your tooth.  Take an antibiotic to treat an infection, if told by your dentist. Do not stop taking the antibiotic even if your condition gets better.  Regular dental cleanings and brushing can help prevent dental caries. This information is not intended to replace advice given to you by your health care provider. Make sure you discuss any questions you have with your health care provider. Document Released: 06/29/2008 Document Revised: 06/06/2016 Document Reviewed: 06/06/2016 Elsevier Interactive Patient Education  2019  [employer]vier Inc.  Strep Throat  Strep throat is an infection of the throat. It is caused by germs. Strep throat spreads from person to person because of coughing, sneezing, or close contact. Follow these instructions at home: Medicines  Take over-the-counter and prescription medicines only as told by your doctor.  Take your antibiotic medicine as told by your doctor. Do not stop taking the medicine even if you feel better.  Have family members who also have a sore throat or fever go to a doctor. Eating and drinking  Do not share food, drinking cups, or personal items.  Try eating soft foods until your sore throat feels better.  Drink enough fluid to keep your pee (urine) clear or pale yellow. General instructions  Rinse your mouth (gargle) with a salt-water mixture 3-4 times per day or as needed. To make a salt-water mixture, stir -1 tsp of salt into 1 cup of warm water.  Make sure that all people in your house wash their hands well.  Rest.  Stay home from school or work until you have been taking antibiotics for 24 hours.  Keep all follow-up visits as told by your doctor. This is important. Contact a doctor if:  Your neck keeps getting bigger.  You get a rash, cough, or earache.  You cough up  thick liquid that is green, yellow-brown, or bloody.  You have pain that does not get better with medicine.  Your problems get worse instead of getting better.  You have a fever. Get help right away if:  You throw up (vomit).  You get a very bad headache.  You neck hurts or it feels stiff.  You have chest pain or you are short of breath.  You have drooling, very bad throat pain, or changes in your voice.  Your neck is swollen or the skin gets red and tender.  Your mouth is dry or you are peeing less than normal.  You keep feeling more tired or it is hard to wake up.  Your joints are red or they hurt. This information is not intended to replace advice given to you by  your health care provider. Make sure you discuss any questions you have with your health care provider. Document Released: 03/08/2008 Document Revised: 05/19/2016 Document Reviewed: 01/13/2015 Elsevier Interactive Patient Education  [employer].

## 2018-12-21 NOTE — Progress Notes (Signed)
Pt discharged to home instructions reviewed with patient. Pt given prescriptions and return to work note, acknowledged understanding. Pt instable condition, ambulated out with NT on room air.  SRP RN

## 2018-12-21 NOTE — Discharge Summary (Signed)
Physician Discharge Summary  Deforrest Bogle FKC:[id number] DOB: [birth year] DOA: 12/18/2018  PCP: Patient, No Pcp Per  Admit date: 12/18/2018 Discharge date: 12/21/2018  Admitted From: Home Disposition: Home  Recommendations for Outpatient Follow-up:  1. Follow up with PCP in 1-2 weeks 2. Needs to establish care with a dentist for exam   Home Health: No Equipment/Devices: None  Discharge Condition: Stable CODE STATUS: Full code Diet recommendation: Regular diet  History of present illness:  Zaven Jonesis a 43 y.o.malewith nosignificant medical history presented to hospital with hives and facial swelling for which he took Benadryl.  It did not seem to help so he decided to come to the hospital.  He also complained of some subjective fever cough and weakness but denies any recent travel or COVID19 exposure.  He stated that he had a lower lip swelling and facial swelling with initial difficulty in swallowing.  Patient also endorsed having skin rash, rash over the palms and soles, and joint pain. 2 weeks ago he developed significant sore throat with white patches he took some Penicillin at home but not sure how much.Since then his throat pain has improved. A few years ago he developed [facility] spotted fever at a dose of doxycycline and develop skin peeling over his hands and feet.  Hospital course: Group A streptococcal tonsillopharyngitis  Patient recently diagnosed with group A strep in the ED, started on penicillin.  Complicated by angioedema and rash.  Infectious disease consulted and followed during hospital course.  Started on IV Unasyn due to facial/lip swelling angioedema.  Afebrile, lactic acid within normal limits.  CT neck notable for mild enlargement of the palatine and lingual tonsils suggesting acute tonsillopharyngitis, bilateral reactive to a cervical lymphadenopathy.  Patient did well with improvement of symptoms and his antibiotics were de-escalated to Augmentin which  he tolerated during the remainder of his hospitalization.  Blood cultures remain negative during his hospital course and a 2D echocardiogram was notable for an EF of 60-65% and no other acute findings.  Patient will continue antibiotics to complete a 7-day course with Augmentin.  Angioedema, urticarial rash, rash over the palms and soles Etiology could be related to penicillin versus streptococcal infection.  Started on IV steroids, Pepcid and Benadryl.  HIV and RPR nonreactive.  Symptoms now have resolved.  IV steroids transition to oral prednisone, which she will continue a outpatient taper.  He was treated also with Pepcid and Benadryl as needed during inpatient hospitalization.  He was monitored on Augmentin without any recurrence of facial edema or rash; and will continue antibiotic course outpatient as above.  Patient will be prescribed EpiPen on discharge.  Post-streptococcal reactive arthritis.   Patient complained of right wrist tenderness and swelling in bilateral knee with arthralgia.-On Unasyn at this time.  Infectious disease to follow.  Urinalysis was negative for proteins.  Renal function is normal.  CRP slightly elevated but ESR within normal limits.  HIV and RPR were negative.  Symptoms now resolved.  Dyspnea Deconditioning Patient reports dyspnea on ambulation to the bathroom last night.  Was started on supplemental oxygen by nursing staff.  No history of lung or cardiac disease.  Patient was evaluated by physical therapy without any posthospitalization needs.  A 6-minute walk test was performed with no desaturations on ambulation.   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[place])   Dental infection    Discharge Instructions  Discharge Instructions    Call MD for:  Complete by:  As directed    Facial swelling, shortness of breath   Call MD for:  difficulty breathing, headache or visual disturbances    Complete by:  As directed    Call MD for:  persistant nausea and vomiting   Complete by:  As directed    Call MD for:  severe uncontrolled pain   Complete by:  As directed    Call MD for:  temperature >100.4   Complete by:  As directed    Diet - low sodium heart healthy   Complete by:  As directed    Increase activity slowly   Complete by:  As directed      Allergies as of 12/21/2018      Reactions   Penicillins Swelling   Doxycycline Hives      Medication List    STOP taking these medications   penicillin v potassium 500 MG tablet Commonly known as:  VEETID     TAKE these medications   acetaminophen 500 MG tablet Commonly known as:  TYLENOL Take 1,500 mg by mouth every 6 (six) hours as needed for mild pain.   amoxicillin-clavulanate 875-125 MG tablet Commonly known as:  AUGMENTIN Take 1 tablet by mouth every 12 (twelve) hours for 5 days.   EPINEPHrine 0.3 mg/0.3 mL Soaj injection Commonly known as:  EPI-PEN Inject 0.3 mLs (0.3 mg total) into the muscle as needed (as needed for signs of anaphylaxys, call md prior to administratin, have this available on the floor).   ibuprofen 200 MG tablet Commonly known as:  ADVIL,MOTRIN Take 400 mg by mouth every 4 (four) hours as needed for fever.   naproxen sodium 220 MG tablet Commonly known as:  ALEVE Take 220 mg by mouth 2 (two) times daily as needed (pain, fever).   predniSONE 5 MG tablet Commonly known as:  DELTASONE Take 8 tablets (40 mg total) by mouth daily with breakfast for 2 days, THEN 6 tablets (30 mg total) daily with breakfast for 2 days, THEN 4 tablets (20 mg total) daily with breakfast for 2 days, THEN 2 tablets (10 mg total) daily with breakfast for 2 days, THEN 1 tablet (5 mg total) daily with breakfast for 2 days. Start taking on:  December 21, 2018      Follow-up Information    PCP,[facility]. Call in 2 week(s).          Allergies  Allergen Reactions  . Penicillins Swelling  . Doxycycline Hives     Consultations:  Infectious disease   Procedures/Studies: Dg Chest 2 View  Result Date: 12/18/2018 CLINICAL DATA:  Shortness of breath, fever EXAM: CHEST - 2 VIEW COMPARISON:  06/20/2018 FINDINGS: Heart and mediastinal contours are within normal limits. No focal opacities or effusions. No acute bony abnormality. IMPRESSION: No active cardiopulmonary disease. Electronically Signed   By: Rolm Baptise M.D.   On: 12/18/2018 22:35   Ct Soft Tissue Neck W Contrast  Result Date: 12/19/2018 CLINICAL DATA:  Sore throat and stridor EXAM: CT NECK WITH CONTRAST TECHNIQUE: Multidetector CT imaging of the neck was performed using the standard protocol following the bolus administration of intravenous contrast. CONTRAST:  65m OMNIPAQUE IOHEXOL 300 MG/ML  SOLN COMPARISON:  None. FINDINGS: Pharynx and larynx: There is mild hypertrophy of the palatine and lingual tonsils. Nasopharynx is normal. The larynx is normal. No retropharyngeal abnormality. Salivary glands: Normal Thyroid: Normal Lymph nodes: Bilateral level 2A nodes measure up to 11 mm. No lower cervical lymphadenopathy. Vascular: Normal. Limited intracranial: Normal  Visualized orbits: Normal Mastoids and visualized paranasal sinuses: Retained secretions within both maxillary sinuses with mild mucosal thickening. Skeleton: No acute or aggressive process. Upper chest: Negative. Other: None. IMPRESSION: 1. Mild enlargement of the palatine and lingual tonsils, suggesting acute tonsillopharyngitis. 2. Bilateral reactive level 2 A cervical lymphadenopathy. Electronically Signed   By: Ulyses Jarred M.D.   On: 12/19/2018 02:03    Transthoracic echocardiogram 12/19/2018: IMPRESSIONS  1. The left ventricle has normal systolic function with an ejection fraction of 60-65%. The cavity size was normal. Left ventricular diastolic parameters were normal.  2. The right ventricle has normal systolic function. The cavity was normal. There is no increase in right  ventricular wall thickness.    Subjective: Patient seen and examined at bedside, no complaints this morning.  Ready for discharge home.  States arthralgias, rash, and facial edema have resolved.  Tolerating Augmentin.  Denies headache, no visual changes, no chest pain, no palpitations, no nausea/vomiting/diarrhea, no fever/chills/night sweats, no abdominal pain, no issues with bowel/bladder function, no paresthesias.  No acute events overnight per nursing staff.   Discharge Exam: Vitals:   12/21/18 0119 12/21/18 0426  BP:  133/88  Pulse: 74 67  Resp:  16  Temp: 98 F (36.7 C) 98.5 F (36.9 C)  SpO2: 99% 99%   Vitals:   12/20/18 1434 12/20/18 2038 12/21/18 0119 12/21/18 0426  BP: 139/87 (!) 147/96  133/88  Pulse: 77 63 74 67  Resp: '20 18  16  '$ Temp: 98.2 F (36.8 C) 98.5 F (36.9 C) 98 F (36.7 C) 98.5 F (36.9 C)  TempSrc: Oral Oral Oral Oral  SpO2: 97% 99% 99% 99%  Weight:      Height:        General: Pt is alert, awake, not in acute distress Cardiovascular: RRR, S1/S2 +, no rubs, no gallops Respiratory: CTA bilaterally, no wheezing, no rhonchi Abdominal: Soft, NT, ND, bowel sounds + Extremities: no edema, no cyanosis    The results of significant diagnostics from this hospitalization (including imaging, microbiology, ancillary and laboratory) are listed below for reference.     Microbiology: Recent Results (from the past 240 hour(s))  Group A Strep by PCR     Status: Abnormal   Collection Time: 12/18/18 10:10 PM  Result Value Ref Range Status   Group A Strep by PCR DETECTED (A) NOT DETECTED Final    Comment: Performed at [facility], [place] [address] [place], [place] [address]  Culture, blood (routine x 2)     Status: None (Preliminary result)   Collection Time: 12/18/18 10:10 PM  Result Value Ref Range Status   Specimen Description   Final    BLOOD LEFT ANTECUBITAL Performed at [place] [address],  [place], [place] [address]    Special Requests   Final    BOTTLES DRAWN AEROBIC AND ANAEROBIC Blood Culture adequate volume Performed at [place] [address] [place], [place] [address]    Culture   Final    NO GROWTH 2 DAYS Performed at [place] [address] [place], [place] [address]    Report Status PENDING  Incomplete  Culture, blood (routine x 2)     Status: None (Preliminary result)   Collection Time: 12/18/18 10:11 PM  Result Value Ref Range Status   Specimen Description   Final    BLOOD RIGHT ANTECUBITAL Performed at [place] [address] [place], [place] [address]  Special Requests   Final    BOTTLES DRAWN AEROBIC AND ANAEROBIC Blood Culture results may not be optimal due to an excessive volume of blood received in culture bottles Performed at [place] [address], [place], [place] [address]    Culture   Final    NO GROWTH 2 DAYS Performed at [place] [address], [place], [place] [address]    Report Status PENDING  Incomplete  MRSA PCR Screening     Status: None   Collection Time: 12/19/18  1:52 AM  Result Value Ref Range Status   MRSA by PCR NEGATIVE NEGATIVE Final    Comment:        The GeneXpert MRSA Assay (FDA approved for NASAL specimens only), is one component of a comprehensive MRSA colonization surveillance program. It is not intended to diagnose MRSA infection nor to guide or monitor treatment for MRSA infections. Performed at [facility], [place] [address], [place], [place] [address]      Labs: BNP (last 3 results) No results for input(s): BNP in the last 8760 hours. Basic Metabolic Panel: Recent Labs  Lab 12/18/18 2201 12/19/18 0636 12/20/18 0309 12/21/18 0346  NA 137 138 136 138  K 3.6 4.0 4.5 3.8  CL 105 108 107 105  CO2 23 21* 22 24  GLUCOSE 106* 133* 172* 111*  BUN '11 10 12 16  '$ CREATININE 1.03 0.99 0.82 0.89   CALCIUM 8.9 8.5* 9.0 8.9  MG  --  1.8 1.9  --   PHOS  --  2.1*  --   --    Liver Function Tests: Recent Labs  Lab 12/18/18 2201 12/19/18 0636  AST 21 18  ALT 25 24  ALKPHOS 74 66  BILITOT 1.4* 1.3*  PROT 7.6 6.9  ALBUMIN 3.9 3.7   Recent Labs  Lab 12/18/18 2201  LIPASE 39   No results for input(s): AMMONIA in the last 168 hours. CBC: Recent Labs  Lab 12/18/18 2201 12/19/18 0636 12/20/18 0309 12/21/18 0346  WBC 17.5* 17.4* 20.0* 20.2*  NEUTROABS 14.0*  --   --   --   HGB 14.7 13.5 13.1 12.4*  HCT 44.3 41.2 39.5 38.2*  MCV 87.7 88.6 88.6 89.7  PLT 247 224 230 231   Cardiac Enzymes: No results for input(s): CKTOTAL, CKMB, CKMBINDEX, TROPONINI in the last 168 hours. BNP: Invalid input(s): POCBNP CBG: No results for input(s): GLUCAP in the last 168 hours. D-Dimer No results for input(s): DDIMER in the last 72 hours. Hgb A1c No results for input(s): HGBA1C in the last 72 hours. Lipid Profile No results for input(s): CHOL, HDL, LDLCALC, TRIG, CHOLHDL, LDLDIRECT in the last 72 hours. Thyroid function studies Recent Labs    12/19/18 0636  TSH 0.455   Anemia work up No results for input(s): VITAMINB12, FOLATE, FERRITIN, TIBC, IRON, RETICCTPCT in the last 72 hours. Urinalysis    Component Value Date/Time   COLORURINE YELLOW 12/19/2018 0222   APPEARANCEUR CLEAR 12/19/2018 0222   LABSPEC 1.027 12/19/2018 0222   PHURINE 5.0 12/19/2018 0222   GLUCOSEU NEGATIVE 12/19/2018 0222   HGBUR NEGATIVE 12/19/2018 0222   BILIRUBINUR NEGATIVE 12/19/2018 0222   KETONESUR NEGATIVE 12/19/2018 0222   PROTEINUR NEGATIVE 12/19/2018 0222   NITRITE NEGATIVE 12/19/2018 0222   LEUKOCYTESUR NEGATIVE 12/19/2018 0222   Sepsis Labs Invalid input(s): PROCALCITONIN,  WBC,  LACTICIDVEN Microbiology Recent Results (from the past 240 hour(s))  Group A Strep by PCR  Status: Abnormal   Collection Time: 12/18/18 10:10 PM  Result Value Ref Range Status   Group A Strep by PCR DETECTED  (A) NOT DETECTED Final    Comment: Performed at [facility], [place] [address] [place], [place] [address]  Culture, blood (routine x 2)     Status: None (Preliminary result)   Collection Time: 12/18/18 10:10 PM  Result Value Ref Range Status   Specimen Description   Final    BLOOD LEFT ANTECUBITAL Performed at [place] [address] [place], [place] [address]    Special Requests   Final    BOTTLES DRAWN AEROBIC AND ANAEROBIC Blood Culture adequate volume Performed at [place] [address], [place], [place] [address]    Culture   Final    NO GROWTH 2 DAYS Performed at [place] [address], [place], [place] [address]    Report Status PENDING  Incomplete  Culture, blood (routine x 2)     Status: None (Preliminary result)   Collection Time: 12/18/18 10:11 PM  Result Value Ref Range Status   Specimen Description   Final    BLOOD RIGHT ANTECUBITAL Performed at [place] [address], [place], [place] [address]    Special Requests   Final    BOTTLES DRAWN AEROBIC AND ANAEROBIC Blood Culture results may not be optimal due to an excessive volume of blood received in culture bottles Performed at [place] [address] [place], [place] [address]    Culture   Final    NO GROWTH 2 DAYS Performed at [place] [address], [place], [place] [address]    Report Status PENDING  Incomplete  MRSA PCR Screening     Status: None   Collection Time: 12/19/18  1:52 AM  Result Value Ref Range Status   MRSA by PCR NEGATIVE NEGATIVE Final    Comment:        The GeneXpert MRSA Assay (FDA approved for NASAL specimens only), is one component of a comprehensive MRSA colonization surveillance program. It is not intended to diagnose MRSA infection nor to guide or monitor treatment for MRSA infections. Performed at [facility], [place]  [address] [place], [place] [address]      Time coordinating discharge: Over 30 minutes  SIGNED:   Donnamarie Poag [place] DO  [facility] 12/21/2018, 8:53 AM

## 2018-12-23 LAB — CULTURE, BLOOD (ROUTINE X 2)
CULTURE: NO GROWTH
Culture: NO GROWTH
Special Requests: ADEQUATE

## 2019-01-18 ENCOUNTER — Emergency Department (HOSPITAL_COMMUNITY): Payer: Self-pay

## 2019-01-18 ENCOUNTER — Emergency Department (HOSPITAL_COMMUNITY)
Admission: EM | Admit: 2019-01-18 | Discharge: 2019-01-18 | Disposition: A | Discharge status: Home / Self Care | Payer: Self-pay | Attending: Emergency Medicine | Admitting: Emergency Medicine

## 2019-01-18 ENCOUNTER — Other Ambulatory Visit: Payer: Self-pay

## 2019-01-18 ENCOUNTER — Encounter (HOSPITAL_COMMUNITY): Payer: Self-pay

## 2019-01-18 DIAGNOSIS — R0602 Shortness of breath: Secondary | ICD-10-CM | POA: Insufficient documentation

## 2019-01-18 DIAGNOSIS — R0789 Other chest pain: Secondary | ICD-10-CM | POA: Insufficient documentation

## 2019-01-18 DIAGNOSIS — Z880 Allergy status to penicillin: Secondary | ICD-10-CM | POA: Insufficient documentation

## 2019-01-18 DIAGNOSIS — Z20828 Contact with and (suspected) exposure to other viral communicable diseases: Secondary | ICD-10-CM | POA: Insufficient documentation

## 2019-01-18 DIAGNOSIS — F17210 Nicotine dependence, cigarettes, uncomplicated: Secondary | ICD-10-CM | POA: Insufficient documentation

## 2019-01-18 HISTORY — DX: Anaphylactic shock, unspecified, initial encounter: T78.2XXA

## 2019-01-18 LAB — BLOOD GAS, ARTERIAL
Acid-base deficit: 0.9 mmol/L (ref 0.0–2.0)
Bicarbonate: 23.1 mmol/L (ref 20.0–28.0)
Drawn by: 257701
O2 Saturation: 96.9 %
Patient temperature: 98.6
pCO2 arterial: 38 mmHg (ref 32.0–48.0)
pH, Arterial: 7.401 (ref 7.350–7.450)
pO2, Arterial: 87.6 mmHg (ref 83.0–108.0)

## 2019-01-18 LAB — CBC WITH DIFFERENTIAL/PLATELET
Abs Immature Granulocytes: 0.12 10*3/uL — ABNORMAL HIGH (ref 0.00–0.07)
Basophils Absolute: 0.1 10*3/uL (ref 0.0–0.1)
Basophils Relative: 1 %
Eosinophils Absolute: 0.2 10*3/uL (ref 0.0–0.5)
Eosinophils Relative: 3 %
HCT: 48.9 % (ref 39.0–52.0)
Hemoglobin: 15.7 g/dL (ref 13.0–17.0)
Immature Granulocytes: 1 %
Lymphocytes Relative: 33 %
Lymphs Abs: 3.1 10*3/uL (ref 0.7–4.0)
MCH: 28.7 pg (ref 26.0–34.0)
MCHC: 32.1 g/dL (ref 30.0–36.0)
MCV: 89.4 fL (ref 80.0–100.0)
Monocytes Absolute: 1 10*3/uL (ref 0.1–1.0)
Monocytes Relative: 10 %
Neutro Abs: 4.8 10*3/uL (ref 1.7–7.7)
Neutrophils Relative %: 52 %
Platelets: 279 10*3/uL (ref 150–400)
RBC: 5.47 MIL/uL (ref 4.22–5.81)
RDW: 12.2 % (ref 11.5–15.5)
WBC: 9.3 10*3/uL (ref 4.0–10.5)
nRBC: 0 % (ref 0.0–0.2)

## 2019-01-18 LAB — BASIC METABOLIC PANEL
Anion gap: 11 (ref 5–15)
BUN: 15 mg/dL (ref 6–20)
CO2: 21 mmol/L — ABNORMAL LOW (ref 22–32)
Calcium: 9.1 mg/dL (ref 8.9–10.3)
Chloride: 105 mmol/L (ref 98–111)
Creatinine, Ser: 1.04 mg/dL (ref 0.61–1.24)
GFR calc Af Amer: >60 mL/min (ref >60)
GFR calc non Af Amer: >60 mL/min (ref >60)
Glucose, Bld: 106 mg/dL — ABNORMAL HIGH (ref 70–99)
Potassium: 3.9 mmol/L (ref 3.5–5.1)
Sodium: 137 mmol/L (ref 135–145)

## 2019-01-18 LAB — I-STAT TROPONIN, ED: Troponin i, poc: 0 ng/mL (ref 0.00–0.08)

## 2019-01-18 LAB — SARS CORONAVIRUS 2 BY RT PCR (HOSPITAL ORDER, PERFORMED IN ~~LOC~~ HOSPITAL LAB): SARS Coronavirus 2: NEGATIVE

## 2019-01-18 LAB — D-DIMER, QUANTITATIVE: D-Dimer, Quant: <0.27 ug/mL-FEU (ref 0.00–0.50)

## 2019-01-18 MED ORDER — PREDNISONE 50 MG PO TABS: ORAL_TABLET | ORAL | 0 refills | Status: DC

## 2019-01-18 MED ORDER — PREDNISONE 20 MG PO TABS
60.0000 mg | ORAL_TABLET | Freq: Once | ORAL | Status: AC
  Administered 2019-01-18: 60 mg via ORAL
  Filled 2019-01-18: qty 3

## 2019-01-18 MED ORDER — ALBUTEROL SULFATE HFA 108 (90 BASE) MCG/ACT IN AERS
2.0000 | INHALATION_SPRAY | Freq: Once | RESPIRATORY_TRACT | Status: AC
  Administered 2019-01-18: 17:00:00 2 via RESPIRATORY_TRACT
  Filled 2019-01-18: qty 6.7

## 2019-01-18 MED ORDER — SODIUM CHLORIDE 0.9 % IV SOLN
INTRAVENOUS | Status: DC
  Administered 2019-01-18: 17:00:00 via INTRAVENOUS

## 2019-01-18 MED ORDER — ALBUTEROL SULFATE HFA 108 (90 BASE) MCG/ACT IN AERS: 1.0000 | INHALATION_SPRAY | Freq: Four times a day (QID) | RESPIRATORY_TRACT | 0 refills | Status: DC | PRN

## 2019-01-18 MED ORDER — IOHEXOL 350 MG/ML SOLN
100.0000 mL | Freq: Once | INTRAVENOUS | Status: AC | PRN
  Administered 2019-01-18: 19:00:00 100 mL via INTRAVENOUS

## 2019-01-18 NOTE — ED Triage Notes (Signed)
Patient c/o SOB, fatigue, and chest pain x 2-3 days. Patient states, "I feel like I can't get my breath."

## 2019-01-18 NOTE — ED Notes (Signed)
Ambulated pt in room while monitoring O2 saturation. Lowest O2 levels noted was 94%, with average level being 96%. Pt had some noticeable increased WOB, but no severe respiratory distress.

## 2019-01-18 NOTE — Discharge Instructions (Addendum)
Your COVID-19 test here today was negative.  Use the inhaler as directed.  Take the prednisone as directed.  Return here if your symptoms become severely worse

## 2019-01-18 NOTE — ED Provider Notes (Addendum)
[facility]-EMERGENCY DEPT Provider Note   CSN: [id number] Arrival date & time: 01/18/19  1559    History   Chief Complaint Chief Complaint  Patient presents with  . Shortness of Breath  . Chest Pain    HPI [facility] is a 43 y.o. male.     43 year old male presents with several days of shortness of breath and chest tightness that is worse at night.  Notes some orthopnea but denies any lower extremity edema.  Cough is been nonproductive and he denies any fever or chills.  Patient states that he feels like he just cannot catch his breath.  Does have a history of tobacco use in the past but denies any history of COPD.  No vomiting or diarrhea.  No anginal type quality to his symptoms.  No prior history of same     Past Medical History:  Diagnosis Date  . Anaphylaxis   . Low back pain     Patient Active Problem List   Diagnosis Date Noted  . Dental infection   . 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 Streptococcal infection group A 12/18/2018    Past Surgical History:  Procedure Laterality Date  . FACIAL FRACTURE SURGERY    . WISDOM TOOTH EXTRACTION          Home Medications    Prior to Admission medications   Medication Sig Start Date End Date Taking? Authorizing Provider  acetaminophen (TYLENOL) 500 MG tablet Take 1,500 mg by mouth every 6 (six) hours as needed for mild pain.    [provider]  EPINEPHrine 0.3 mg/0.3 mL IJ SOAJ injection Inject 0.3 mLs (0.3 mg total) into the muscle as needed (as needed for signs of anaphylaxys, call md prior to administratin, have this available on the floor). 12/21/18   [place], Alvira Philips, DO  ibuprofen (ADVIL,MOTRIN) 200 MG tablet Take 400 mg by mouth every 4 (four) hours as needed for fever.    [provider]  naproxen sodium (ALEVE) 220 MG tablet Take 220 mg by mouth 2 (two) times daily as needed (pain, fever).    [provider]    Family History Family  History  Problem Relation Age of Onset  . Hypertension Mother   . Heart attack Father   . Cancer Other     Social History Social History   Tobacco Use  . Smoking status: Current Every Day Smoker    Packs/day: 0.50    Types: Cigarettes  . Smokeless tobacco: Never Used  Substance Use Topics  . Alcohol use: Yes    Comment: occassionally  . Drug use: No     Allergies   Penicillins and Doxycycline   Review of Systems Review of Systems  All other systems reviewed and are negative.    Physical Exam Updated Vital Signs BP (!) 148/96 (BP Location: Left Arm)   Pulse 88   Temp 98.8 F (37.1 C) (Oral)   Resp (!) 23   Ht 1.829 m (6')   Wt 96.6 kg   SpO2 99%   BMI 28.89 kg/m   Physical Exam Vitals signs and nursing note reviewed.  Constitutional:      General: He is not in acute distress.    Appearance: Normal appearance. He is well-developed. He is not toxic-appearing.  HENT:     Head: Normocephalic and atraumatic.  Eyes:     General: Lids are normal.     Conjunctiva/sclera: Conjunctivae normal.     Pupils: Pupils are  equal, round, and reactive to light.  Neck:     Musculoskeletal: Normal range of motion and neck supple.     Thyroid: No thyroid mass.     Trachea: No tracheal deviation.  Cardiovascular:     Rate and Rhythm: Normal rate and regular rhythm.     Heart sounds: Normal heart sounds. No murmur. No gallop.   Pulmonary:     Effort: Pulmonary effort is normal. No respiratory distress.     Breath sounds: No stridor. Decreased breath sounds present. No wheezing, rhonchi or rales.  Abdominal:     General: Bowel sounds are normal. There is no distension.     Palpations: Abdomen is soft.     Tenderness: There is no abdominal tenderness. There is no rebound.  Musculoskeletal: Normal range of motion.        General: No tenderness.  Skin:    General: Skin is warm and dry.     Findings: No abrasion or rash.  Neurological:     Mental Status: He is alert and  oriented to person, place, and time.     GCS: GCS eye subscore is 4. GCS verbal subscore is 5. GCS motor subscore is 6.     Cranial Nerves: No cranial nerve deficit.     Sensory: No sensory deficit.  Psychiatric:        Speech: Speech normal.        Behavior: Behavior normal.      ED Treatments / Results  Labs (all labs ordered are listed, but only abnormal results are displayed) Labs Reviewed  SARS CORONAVIRUS 2 (HOSPITAL ORDER, PERFORMED IN [facility])  CBC WITH DIFFERENTIAL/PLATELET  BASIC METABOLIC PANEL  D-DIMER, QUANTITATIVE (NOT AT [facility])  BRAIN NATRIURETIC PEPTIDE  I-STAT TROPONIN, ED    EKG EKG Interpretation  Date/Time:  Thursday January 18 2019 16:12:31 EDT Ventricular Rate:  86 PR Interval:    QRS Duration: 78 QT Interval:  325 QTC Calculation: 389 R Axis:   58 Text Interpretation:  Sinus rhythm Minimal ST elevation, inferior leads No significant change since last tracing Confirmed by Lorre NickAllen, Eunie Lawn ([id number]) on 01/18/2019 4:22:38 PM   Radiology No results found.  Procedures Procedures (including critical care time)  Medications Ordered in ED Medications  0.9 %  sodium chloride infusion (has no administration in time range)  albuterol (VENTOLIN HFA) 108 (90 Base) MCG/ACT inhaler 2 puff (has no administration in time range)     Initial Impression / Assessment and Plan / ED Course  I have reviewed the triage vital signs and the nursing notes.  Pertinent labs & imaging results that were available during my care of the patient were reviewed by me and considered in my medical decision making (see chart for details).        Patient presented initially complaining of shortness of breath along with chest pain characterizes sharp and worse at night.  Patient felt he could not get his breath.  Patient had extensive work-up here including chest CT, COVID testing, blood work.  Blood gases well 2.  All of his tests and labs are reassuring.  Given  albuterol 2 puffs and feels better.  Suspect some element of bronchospasm or pleurisy with his symptoms.  Low suspicion for ACS.  Patient ambulated here in the department and pulse oximetry was stable.  Will be discharged home   Inis Sizerdrak Goehring was evaluated in Emergency Department on 01/18/2019 for the symptoms described in the history of present illness. He was evaluated  in the context of the global COVID-19 pandemic, which necessitated consideration that the patient might be at risk for infection with the SARS-CoV-2 virus that causes COVID-19. Institutional protocols and algorithms that pertain to the evaluation of patients at risk for COVID-19 are in a state of rapid change based on information released by regulatory bodies including the CDC and federal and state organizations. These policies and algorithms were followed during the patient's care in the ED.  Final Clinical Impressions(s) / ED Diagnoses   Final diagnoses:  None    ED Discharge Orders    None       Lorre Nick, MD 01/18/19 Corky Crafts    Lorre Nick, MD 01/18/19 2015

## 2019-05-14 ENCOUNTER — Other Ambulatory Visit: Payer: Self-pay

## 2019-05-14 ENCOUNTER — Encounter (HOSPITAL_COMMUNITY): Payer: Self-pay | Admitting: Emergency Medicine

## 2019-05-14 ENCOUNTER — Emergency Department (HOSPITAL_COMMUNITY): Payer: Worker's Compensation

## 2019-05-14 ENCOUNTER — Emergency Department (HOSPITAL_COMMUNITY)
Admission: EM | Admit: 2019-05-14 | Discharge: 2019-05-14 | Disposition: A | Discharge status: Home / Self Care | Payer: Worker's Compensation | Attending: Emergency Medicine | Admitting: Emergency Medicine

## 2019-05-14 DIAGNOSIS — R079 Chest pain, unspecified: Secondary | ICD-10-CM | POA: Diagnosis not present

## 2019-05-14 DIAGNOSIS — R05 Cough: Secondary | ICD-10-CM | POA: Diagnosis not present

## 2019-05-14 DIAGNOSIS — Y990 Civilian activity done for income or pay: Secondary | ICD-10-CM | POA: Diagnosis not present

## 2019-05-14 DIAGNOSIS — T65831A Toxic effect of fiberglass, accidental (unintentional), initial encounter: Secondary | ICD-10-CM | POA: Insufficient documentation

## 2019-05-14 DIAGNOSIS — R059 Cough, unspecified: Secondary | ICD-10-CM

## 2019-05-14 DIAGNOSIS — R51 Headache: Secondary | ICD-10-CM | POA: Diagnosis not present

## 2019-05-14 DIAGNOSIS — Z79899 Other long term (current) drug therapy: Secondary | ICD-10-CM | POA: Insufficient documentation

## 2019-05-14 DIAGNOSIS — R0602 Shortness of breath: Secondary | ICD-10-CM | POA: Diagnosis present

## 2019-05-14 DIAGNOSIS — T1490XA Injury, unspecified, initial encounter: Secondary | ICD-10-CM

## 2019-05-14 DIAGNOSIS — F17210 Nicotine dependence, cigarettes, uncomplicated: Secondary | ICD-10-CM | POA: Diagnosis not present

## 2019-05-14 MED ORDER — PREDNISONE 20 MG PO TABS
40.0000 mg | ORAL_TABLET | Freq: Once | ORAL | Status: AC
  Administered 2019-05-14: 40 mg via ORAL
  Filled 2019-05-14: qty 2

## 2019-05-14 MED ORDER — PREDNISONE 20 MG PO TABS: 40.0000 mg | ORAL_TABLET | Freq: Every day | ORAL | 0 refills | Status: DC

## 2019-05-14 MED ORDER — ALBUTEROL SULFATE HFA 108 (90 BASE) MCG/ACT IN AERS
2.0000 | INHALATION_SPRAY | RESPIRATORY_TRACT | Status: DC | PRN
  Administered 2019-05-14: 2 via RESPIRATORY_TRACT
  Filled 2019-05-14: qty 6.7

## 2019-05-14 NOTE — ED Triage Notes (Signed)
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 Pt reports that his chest is tight and can't stop coughing when talks, feels nausea. Denies eating today. And headache.

## 2019-05-14 NOTE — ED Triage Notes (Signed)
Pt reports there was fiberglass insulation under there. Didn't have on facemask or eye glasses. Reports that he did flush out eyes.

## 2019-05-14 NOTE — ED Provider Notes (Addendum)
[facility]-EMERGENCY DEPT Provider Note   CSN: [id number] Arrival date & time: 05/14/19  1457    History   Chief Complaint Chief Complaint  Patient presents with  . Shortness of Breath  . Cough  . Toxic Inhalation    HPI Keith Swanson is a 43 y.o. male.     HPI Patient presents with shortness of breath.  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  States that there was no mask for him.  States he began to cough.  States there is no production with it.  No history of asthma.  Has had anaphylaxis previously however.  States he is got a dull headache now.  Slight dull chest pain.  No fevers or chills.  States he was told not to come back to the job if he left at that time. Past Medical History:  Diagnosis Date  . Anaphylaxis   . Low back pain     Patient Active Problem List   Diagnosis Date Noted  . Dental infection   . 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 Streptococcal infection group A 12/18/2018    Past Surgical History:  Procedure Laterality Date  . FACIAL FRACTURE SURGERY    . WISDOM TOOTH EXTRACTION          Home Medications    Prior to Admission medications   Medication Sig Start Date End Date Taking? Authorizing Provider  albuterol (VENTOLIN HFA) 108 (90 Base) MCG/ACT inhaler Inhale 1-2 puffs into the lungs every 6 (six) hours as needed for wheezing or shortness of breath. 01/18/19   Lorre NickAllen, Anthony, MD  EPINEPHrine 0.3 mg/0.3 mL IJ SOAJ injection Inject 0.3 mLs (0.3 mg total) into the muscle as needed (as needed for signs of anaphylaxys, call md prior to administratin, have this available on the floor). 12/21/18   [place], Alvira PhilipsEric J, DO  naproxen sodium (ALEVE) 220 MG tablet Take 220 mg by mouth 2 (two) times daily as needed (pain, fever).    [provider]  predniSONE (DELTASONE) 20 MG tablet Take 2 tablets (40 mg total) by mouth daily. 05/15/19   Benjiman CorePickering, Kaydince Towles, MD    Family  History Family History  Problem Relation Age of Onset  . Hypertension Mother   . Heart attack Father   . Cancer Other     Social History Social History   Tobacco Use  . Smoking status: Current Every Day Smoker    Packs/day: 0.50    Types: Cigarettes  . Smokeless tobacco: Never Used  Substance Use Topics  . Alcohol use: Yes    Comment: occassionally  . Drug use: No     Allergies   Penicillins and Doxycycline   Review of Systems Review of Systems  Constitutional: Negative for appetite change.  HENT: Negative for congestion.   Respiratory: Positive for cough and shortness of breath.   Cardiovascular: Positive for chest pain.  Gastrointestinal: Negative for abdominal pain.  Genitourinary: Negative for frequency.  Musculoskeletal: Negative for back pain.  Skin: Negative for rash and wound.  Neurological: Positive for headaches.  Psychiatric/Behavioral: Negative for confusion.     Physical Exam Updated Vital Signs BP 128/86   Pulse 70   Temp 98.5 F (36.9 C) (Oral)   Resp 15   SpO2 97%   Physical Exam Vitals signs reviewed.  HENT:     Head: Normocephalic.  Cardiovascular:     Rate and Rhythm: Normal rate and regular rhythm.  Pulmonary:  Breath sounds: Wheezing present.     Comments: Mildly harsh breath sounds with few scattered wheezes.  No respiratory distress.  Does have frequent cough. Chest:     Chest wall: No deformity or tenderness.  Musculoskeletal:     Right lower leg: No edema.     Left lower leg: No edema.  Neurological:     Mental Status: He is alert.      ED Treatments / Results  Labs (all labs ordered are listed, but only abnormal results are displayed) Labs Reviewed - No data to display  EKG EKG Interpretation  Date/Time:  Monday May 14 2019 17:15:56 EDT Ventricular Rate:  68 PR Interval:    QRS Duration: 85 QT Interval:  366 QTC Calculation: 390 R Axis:   66 Text Interpretation:  Age not entered, assumed to be  43  years old for purpose of ECG interpretation Sinus rhythm ST elev, probable normal early repol pattern Confirmed by Davonna Belling [phone]) on 05/14/2019 5:27:40 PM   Radiology Dg Chest 2 View  Result Date: 05/14/2019 CLINICAL DATA:  Shortness of breath. Inhalation of insulation materials. EXAM: CHEST - 2 VIEW COMPARISON:  One-view chest x-ray 01/18/2019 FINDINGS: The heart size and mediastinal contours are within normal limits. Both lungs are clear. The visualized skeletal structures are unremarkable. IMPRESSION: Negative two view chest x-ray Electronically Signed   By: San Morelle M.D.   On: 05/14/2019 15:57    Procedures Procedures (including critical care time)  Medications Ordered in ED Medications  albuterol (VENTOLIN HFA) 108 (90 Base) MCG/ACT inhaler 2 puff (2 puffs Inhalation Given 05/14/19 1728)  predniSONE (DELTASONE) tablet 40 mg (40 mg Oral Given 05/14/19 1728)     Initial Impression / Assessment and Plan / ED Course  I have reviewed the triage vital signs and the nursing notes.  Pertinent labs & imaging results that were available during my care of the patient were reviewed by me and considered in my medical decision making (see chart for details).        Patient with cough and some wheezing after exposure to fiberglass insulation.  Unknown if there is mold.  X-ray reassuring.  Not hypoxic.  Will treat symptomatically.  Will get some steroids and inhaler.  Discharge home.  Final Clinical Impressions(s) / ED Diagnoses   Final diagnoses:  Cough  Inhalation injury    ED Discharge Orders         Ordered    predniSONE (DELTASONE) 20 MG tablet  Daily     05/14/19 1728           Davonna Belling, MD 05/14/19 1729    Davonna Belling, MD 05/14/19 1729

## 2019-05-14 NOTE — ED Notes (Signed)
An After Visit Summary was printed and given to the patient. Discharge instructions given and no further questions at this time.  

## 2019-05-15 ENCOUNTER — Emergency Department (HOSPITAL_COMMUNITY)
Admission: EM | Admit: 2019-05-15 | Discharge: 2019-05-15 | Disposition: A | Discharge status: Home / Self Care | Payer: Worker's Compensation | Attending: Emergency Medicine | Admitting: Emergency Medicine

## 2019-05-15 ENCOUNTER — Other Ambulatory Visit: Payer: Self-pay

## 2019-05-15 DIAGNOSIS — Y9389 Activity, other specified: Secondary | ICD-10-CM | POA: Insufficient documentation

## 2019-05-15 DIAGNOSIS — Y990 Civilian activity done for income or pay: Secondary | ICD-10-CM | POA: Insufficient documentation

## 2019-05-15 DIAGNOSIS — F17210 Nicotine dependence, cigarettes, uncomplicated: Secondary | ICD-10-CM | POA: Insufficient documentation

## 2019-05-15 DIAGNOSIS — R062 Wheezing: Secondary | ICD-10-CM | POA: Insufficient documentation

## 2019-05-15 DIAGNOSIS — J689 Unspecified respiratory condition due to chemicals, gases, fumes and vapors: Secondary | ICD-10-CM | POA: Insufficient documentation

## 2019-05-15 DIAGNOSIS — R05 Cough: Secondary | ICD-10-CM

## 2019-05-15 DIAGNOSIS — T578X1A Toxic effect of other specified inorganic substances, accidental (unintentional), initial encounter: Secondary | ICD-10-CM | POA: Insufficient documentation

## 2019-05-15 DIAGNOSIS — R059 Cough, unspecified: Secondary | ICD-10-CM

## 2019-05-15 DIAGNOSIS — T1490XA Injury, unspecified, initial encounter: Secondary | ICD-10-CM

## 2019-05-15 DIAGNOSIS — Y929 Unspecified place or not applicable: Secondary | ICD-10-CM | POA: Diagnosis not present

## 2019-05-15 MED ORDER — PREDNISONE 20 MG PO TABS: 40.0000 mg | ORAL_TABLET | Freq: Every day | ORAL | 0 refills | Status: DC

## 2019-05-15 MED ORDER — IPRATROPIUM-ALBUTEROL 0.5-2.5 (3) MG/3ML IN SOLN
3.0000 mL | Freq: Once | RESPIRATORY_TRACT | Status: AC
  Administered 2019-05-15: 3 mL via RESPIRATORY_TRACT
  Filled 2019-05-15: qty 3

## 2019-05-15 MED ORDER — PREDNISONE 20 MG PO TABS
60.0000 mg | ORAL_TABLET | Freq: Once | ORAL | Status: AC
  Administered 2019-05-15: 60 mg via ORAL
  Filled 2019-05-15: qty 3

## 2019-05-15 MED ORDER — ALBUTEROL SULFATE HFA 108 (90 BASE) MCG/ACT IN AERS: 2.0000 | INHALATION_SPRAY | Freq: Once | RESPIRATORY_TRACT | Status: DC

## 2019-05-15 NOTE — ED Triage Notes (Signed)
Patient was picked up from Urgent Care and transported via [employer]. He was seen yesterday at [facility] Emergency Department and diagnosed with cough and inhalation injury. Patient was working under a house without proper airway protection. Patient was informed to go to an Urgent Care for his employer. When he got to the urgent care, they thought he needed further treatment from ED. Patient has a cough but no signs of distress.

## 2019-05-15 NOTE — ED Provider Notes (Signed)
[facility]-EMERGENCY DEPT Provider Note   CSN: [id number] Arrival date & time: 05/15/19  1657     History   Chief Complaint Chief Complaint  Patient presents with  . Toxic Inhalation    HPI Keith Swanson is a 43 y.o. male who presents with a cough. PMH significant for tobacco abuse, hx of anaphylaxis. He states that he was working on a job in a [employer] space yesterday and they did not have a mask for him. 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 Afterwards he started to cough and was SOB. He came to the ED and had an xray done which was normal. He was given an inhaler and dose of steroids and felt better when he left. He had to use the inhaler several times overnight and throughout the day today. Today he was told to go to Urgent care for workman's comp. He went there and was told by the provider that his lungs sounded abnormal and he needed to come back to the ED so they called EMS to have him transported here. The patient states that he's not sure why he was sent here and thinks he needs more time at home to help calm down the coughing. He also reports some swelling of his sinuses and has used Afrin but it caused his nose to burn. He reports pain in his throat with coughing. He denies current chest pain or abdominal pain. No fevers.      HPI  Past Medical History:  Diagnosis Date  . Anaphylaxis   . Low back pain     Patient Active Problem List   Diagnosis Date Noted  . Dental infection   . 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 Streptococcal infection group A 12/18/2018    Past Surgical History:  Procedure Laterality Date  . FACIAL FRACTURE SURGERY    . WISDOM TOOTH EXTRACTION          Home Medications    Prior to Admission medications   Medication Sig Start Date End Date Taking? Authorizing Provider  albuterol (VENTOLIN HFA) 108 (90 Base) MCG/ACT inhaler Inhale 1-2 puffs into the lungs  every 6 (six) hours as needed for wheezing or shortness of breath. 01/18/19   Lorre NickAllen, Anthony, MD  EPINEPHrine 0.3 mg/0.3 mL IJ SOAJ injection Inject 0.3 mLs (0.3 mg total) into the muscle as needed (as needed for signs of anaphylaxys, call md prior to administratin, have this available on the floor). 12/21/18   [place], Alvira PhilipsEric J, DO  naproxen sodium (ALEVE) 220 MG tablet Take 220 mg by mouth 2 (two) times daily as needed (pain, fever).    [provider]  predniSONE (DELTASONE) 20 MG tablet Take 2 tablets (40 mg total) by mouth daily. 05/15/19   Benjiman CorePickering, Nathan, MD    Family History Family History  Problem Relation Age of Onset  . Hypertension Mother   . Heart attack Father   . Cancer Other     Social History Social History   Tobacco Use  . Smoking status: Current Every Day Smoker    Packs/day: 0.50    Types: Cigarettes  . Smokeless tobacco: Never Used  Substance Use Topics  . Alcohol use: Yes    Comment: occassionally  . Drug use: No     Allergies   Penicillins and Doxycycline   Review of Systems Review of Systems  Constitutional: Negative for fever.  HENT: Positive for sinus pressure and sore throat (with  coughing).   Respiratory: Positive for cough, shortness of breath and wheezing.   Cardiovascular: Negative for chest pain, palpitations and leg swelling.  Gastrointestinal: Negative for abdominal pain.  All other systems reviewed and are negative.    Physical Exam Updated Vital Signs BP 136/68 (BP Location: Right Arm)   Pulse 75   Temp 98.6 F (37 C) (Oral)   Resp 20   Ht 6' (1.829 m)   Wt 94.8 kg   SpO2 97%   BMI 28.35 kg/m   Physical Exam Vitals signs and nursing note reviewed.  Constitutional:      General: He is not in acute distress.    Appearance: Normal appearance. He is well-developed. He is not ill-appearing.     Comments: NAD. Frequent coughing spells  HENT:     Head: Normocephalic and atraumatic.     Nose: Mucosal edema present.      Right Sinus: Maxillary sinus tenderness present.     Left Sinus: Maxillary sinus tenderness present.     Mouth/Throat:     Lips: Pink.     Mouth: Mucous membranes are moist.     Pharynx: Oropharynx is clear.  Eyes:     General: No scleral icterus.       Right eye: No discharge.        Left eye: No discharge.     Conjunctiva/sclera: Conjunctivae normal.     Pupils: Pupils are equal, round, and reactive to light.  Neck:     Musculoskeletal: Normal range of motion.  Cardiovascular:     Rate and Rhythm: Normal rate and regular rhythm.  Pulmonary:     Effort: Pulmonary effort is normal. No respiratory distress.     Breath sounds: Normal breath sounds.  Abdominal:     General: There is no distension.     Palpations: Abdomen is soft.     Tenderness: There is no abdominal tenderness.  Skin:    General: Skin is warm and dry.  Neurological:     Mental Status: He is alert and oriented to person, place, and time.  Psychiatric:        Behavior: Behavior normal.      ED Treatments / Results  Labs (all labs ordered are listed, but only abnormal results are displayed) Labs Reviewed - No data to display  EKG None  Radiology Dg Chest 2 View  Result Date: 05/14/2019 CLINICAL DATA:  Shortness of breath. Inhalation of insulation materials. EXAM: CHEST - 2 VIEW COMPARISON:  One-view chest x-ray 01/18/2019 FINDINGS: The heart size and mediastinal contours are within normal limits. Both lungs are clear. The visualized skeletal structures are unremarkable. IMPRESSION: Negative two view chest x-ray Electronically Signed   By: Marin Robertshristopher  Mattern M.D.   On: 05/14/2019 15:57    Procedures Procedures (including critical care time)  Medications Ordered in ED Medications  predniSONE (DELTASONE) tablet 60 mg (60 mg Oral Given 05/15/19 1840)  ipratropium-albuterol (DUONEB) 0.5-2.5 (3) MG/3ML nebulizer solution 3 mL (3 mLs Nebulization Given 05/15/19 1841)     Initial Impression / Assessment  and Plan / ED Course  I have reviewed the triage vital signs and the nursing notes.  Pertinent labs & imaging results that were available during my care of the patient were reviewed by me and considered in my medical decision making (see chart for details).  43 year old male presents with ongoing cough, SOB, wheezing from inhalation injury yesterday. He was sent from UC. His vitals are normal here. He has some  nasal mucosa swelling and is frequently coughing but otherwise his exam is normal. I don't think he needs another CXR at this time with well appearance and normal vitals. He is getting relief with albuterol. Will extend his prednisone for a couple more days and he was advised to try allergy medicine. He is PERC negative. Low suspicion for infectious process such as bacterial PNA or COVID. He was encouraged to return if worsening.  Keith Swanson was evaluated in Emergency Department on 05/15/2019 for the symptoms described in the history of present illness. He was evaluated in the context of the global COVID-19 pandemic, which necessitated consideration that the patient might be at risk for infection with the SARS-CoV-2 virus that causes COVID-19. Institutional protocols and algorithms that pertain to the evaluation of patients at risk for COVID-19 are in a state of rapid change based on information released by regulatory bodies including the CDC and federal and state organizations. These policies and algorithms were followed during the patient's care in the ED.   Final Clinical Impressions(s) / ED Diagnoses   Final diagnoses:  Cough  Inhalation injury    ED Discharge Orders    None       Recardo Evangelist, PA-C 05/15/19 1851    Lacretia Leigh, MD 05/19/19 1546

## 2019-05-15 NOTE — Discharge Instructions (Signed)
Continue to use inhaler as needed for SOB and wheezing Take Prednisone for the next 4 days Please return if worsening

## 2019-07-23 ENCOUNTER — Other Ambulatory Visit: Payer: Self-pay

## 2019-07-23 ENCOUNTER — Encounter (HOSPITAL_COMMUNITY): Payer: Self-pay | Admitting: Emergency Medicine

## 2019-07-23 ENCOUNTER — Emergency Department (HOSPITAL_COMMUNITY)
Admission: EM | Admit: 2019-07-23 | Discharge: 2019-07-24 | Disposition: A | Discharge status: Home / Self Care | Payer: Self-pay | Attending: Emergency Medicine | Admitting: Emergency Medicine

## 2019-07-23 ENCOUNTER — Emergency Department (HOSPITAL_COMMUNITY): Payer: Self-pay

## 2019-07-23 DIAGNOSIS — Z20828 Contact with and (suspected) exposure to other viral communicable diseases: Secondary | ICD-10-CM | POA: Insufficient documentation

## 2019-07-23 DIAGNOSIS — F17210 Nicotine dependence, cigarettes, uncomplicated: Secondary | ICD-10-CM | POA: Insufficient documentation

## 2019-07-23 DIAGNOSIS — J209 Acute bronchitis, unspecified: Secondary | ICD-10-CM | POA: Insufficient documentation

## 2019-07-23 MED ORDER — METHYLPREDNISOLONE SODIUM SUCC 125 MG IJ SOLR
125.0000 mg | Freq: Once | INTRAMUSCULAR | Status: AC
  Administered 2019-07-24: 01:00:00 125 mg via INTRAVENOUS
  Filled 2019-07-23: qty 2

## 2019-07-23 MED ORDER — SODIUM CHLORIDE 0.9% FLUSH
3.0000 mL | Freq: Once | INTRAVENOUS | Status: AC
  Administered 2019-07-23: 3 mL via INTRAVENOUS

## 2019-07-23 MED ORDER — ALBUTEROL SULFATE HFA 108 (90 BASE) MCG/ACT IN AERS
6.0000 | INHALATION_SPRAY | Freq: Once | RESPIRATORY_TRACT | Status: AC
  Administered 2019-07-24: 01:00:00 6 via RESPIRATORY_TRACT
  Filled 2019-07-23: qty 6.7

## 2019-07-23 NOTE — ED Notes (Addendum)
edp bedside

## 2019-07-23 NOTE — ED Triage Notes (Signed)
Patient is complaining of sob x 3days. Patient states he had a bad headache yesterday and started vomiting today. Patient states he has been using his rescue inhaler all day with no relief.

## 2019-07-23 NOTE — ED Provider Notes (Signed)
[facility] DEPT Provider Note   CSN: [id number] Arrival date & time: 07/23/19  2147     History   Chief Complaint Chief Complaint  Patient presents with  . Shortness of Breath  . Emesis    HPI Keith Swanson is a 43 y.o. male.     Patient is a 43 year old male with history of poststreptococcal arthritis.  He presents today for evaluation of a 3-day history of cough that is rapidly worsening.  Cough has been nonproductive.  He reports feeling short of breath and tight in the chest.  He denies fevers or chills.  He states his kids have had URI-like symptoms, but denies any exposures to Covid.  He has tried using his inhaler with little relief.  The history is provided by the patient.  Shortness of Breath Severity:  Moderate Onset quality:  Sudden Duration:  3 days Timing:  Constant Progression:  Worsening Chronicity:  New Relieved by:  Nothing Worsened by:  Nothing Ineffective treatments:  Inhaler Associated symptoms: no sputum production     Past Medical History:  Diagnosis Date  . Anaphylaxis   . Low back pain     Patient Active Problem List   Diagnosis Date Noted  . Dental infection   . Post-streptococcal reactive arthritis ([place]) 12/19/2018  . Streptococcal infection group A 12/18/2018    Past Surgical History:  Procedure Laterality Date  . FACIAL FRACTURE SURGERY    . WISDOM TOOTH EXTRACTION          Home Medications    Prior to Admission medications   Medication Sig Start Date End Date Taking? Authorizing Provider  albuterol (VENTOLIN HFA) 108 (90 Base) MCG/ACT inhaler Inhale 1-2 puffs into the lungs every 6 (six) hours as needed for wheezing or shortness of breath. 01/18/19  Yes Lacretia Leigh, MD  PE-Doxylamine-DM-GG-APAP (TYLENOL COLD & FLU DAY/NIGHT PO) Take 2 capsules by mouth every 6 (six) hours as needed (cold symtpoms).   Yes [provider]  EPINEPHrine 0.3 mg/0.3 mL IJ SOAJ injection Inject 0.3 mLs (0.3  mg total) into the muscle as needed (as needed for signs of anaphylaxys, call md prior to administratin, have this available on the floor). Patient not taking: Reported on 07/23/2019 12/21/18   [place] Donnamarie Poag, DO  predniSONE (DELTASONE) 20 MG tablet Take 2 tablets (40 mg total) by mouth daily. Patient not taking: Reported on 07/23/2019 05/15/19   Recardo Evangelist, PA-C    Family History Family History  Problem Relation Age of Onset  . Hypertension Mother   . Heart attack Father   . Cancer Other     Social History Social History   Tobacco Use  . Smoking status: Current Every Day Smoker    Packs/day: 0.50    Types: Cigarettes  . Smokeless tobacco: Never Used  Substance Use Topics  . Alcohol use: Yes    Comment: occassionally  . Drug use: No     Allergies   Penicillins and Doxycycline   Review of Systems Review of Systems  Respiratory: Positive for shortness of breath. Negative for sputum production.   All other systems reviewed and are negative.    Physical Exam Updated Vital Signs BP 116/83 (BP Location: Right Arm)   Pulse 76   Temp 98.5 F (36.9 C) (Oral)   Resp (!) 31   Ht 6' (1.829 m)   Wt 96.2 kg   SpO2 95%   BMI 28.75 kg/m   Physical Exam Vitals signs and nursing note  reviewed.  Constitutional:      General: He is not in acute distress.    Appearance: He is well-developed. He is not diaphoretic.  HENT:     Head: Normocephalic and atraumatic.  Neck:     Musculoskeletal: Normal range of motion and neck supple.  Cardiovascular:     Rate and Rhythm: Normal rate and regular rhythm.     Heart sounds: No murmur. No friction rub.  Pulmonary:     Effort: Pulmonary effort is normal. No respiratory distress.     Breath sounds: Normal breath sounds. No wheezing or rales.  Abdominal:     General: Bowel sounds are normal. There is no distension.     Palpations: Abdomen is soft.     Tenderness: There is no abdominal tenderness.  Musculoskeletal: Normal  range of motion.     Right lower leg: No edema.     Left lower leg: No edema.  Skin:    General: Skin is warm and dry.  Neurological:     Mental Status: He is alert and oriented to person, place, and time.     Coordination: Coordination normal.      ED Treatments / Results  Labs (all labs ordered are listed, but only abnormal results are displayed) Labs Reviewed  LIPASE, BLOOD  COMPREHENSIVE METABOLIC PANEL  CBC  URINALYSIS, ROUTINE W REFLEX MICROSCOPIC    EKG None  Radiology No results found.  Procedures Procedures (including critical care time)  Medications Ordered in ED Medications  sodium chloride flush (NS) 0.9 % injection 3 mL (has no administration in time range)  albuterol (VENTOLIN HFA) 108 (90 Base) MCG/ACT inhaler 6 puff (has no administration in time range)  methylPREDNISolone sodium succinate (SOLU-MEDROL) 125 mg/2 mL injection 125 mg (has no administration in time range)     Initial Impression / Assessment and Plan / ED Course  I have reviewed the triage vital signs and the nursing notes.  Pertinent labs & imaging results that were available during my care of the patient were reviewed by me and considered in my medical decision making (see chart for details).  Patient presenting here with shortness of breath and cough for the past several days.  He has been using his home inhaler with little relief.  Patient's physical examination is unremarkable and chest x-ray and laboratory studies showed no acute abnormality.  His vitals are stable and oxygen saturations are adequate.  He is somewhat tachypneic initially, however this improved after receiving albuterol and Solu-Medrol in the ER.  Patient will be tested for Covid and treated for possible bronchitis.  He is to continue his inhaler and return as needed if he worsens.  He understands to quarantine at home until the results of his Covid test are known.  Keith Swanson was evaluated in Emergency Department on  07/24/2019 for the symptoms described in the history of present illness. He was evaluated in the context of the global COVID-19 pandemic, which necessitated consideration that the patient might be at risk for infection with the SARS-CoV-2 virus that causes COVID-19. Institutional protocols and algorithms that pertain to the evaluation of patients at risk for COVID-19 are in a state of rapid change based on information released by regulatory bodies including the CDC and federal and state organizations. These policies and algorithms were followed during the patient's care in the ED.  Final Clinical Impressions(s) / ED Diagnoses   Final diagnoses:  None    ED Discharge Orders    None  Geoffery Lyonselo, Christropher Gintz, MD 07/24/19 [phone]

## 2019-07-24 LAB — CBC
HCT: 43.6 % (ref 39.0–52.0)
Hemoglobin: 14.6 g/dL (ref 13.0–17.0)
MCH: 29.9 pg (ref 26.0–34.0)
MCHC: 33.5 g/dL (ref 30.0–36.0)
MCV: 89.2 fL (ref 80.0–100.0)
Platelets: 200 10*3/uL (ref 150–400)
RBC: 4.89 MIL/uL (ref 4.22–5.81)
RDW: 12.2 % (ref 11.5–15.5)
WBC: 8 10*3/uL (ref 4.0–10.5)
nRBC: 0 % (ref 0.0–0.2)

## 2019-07-24 LAB — COMPREHENSIVE METABOLIC PANEL
ALT: 20 U/L (ref 0–44)
AST: 23 U/L (ref 15–41)
Albumin: 3.8 g/dL (ref 3.5–5.0)
Alkaline Phosphatase: 58 U/L (ref 38–126)
Anion gap: 10 (ref 5–15)
BUN: 7 mg/dL (ref 6–20)
CO2: 24 mmol/L (ref 22–32)
Calcium: 8.7 mg/dL — ABNORMAL LOW (ref 8.9–10.3)
Chloride: 108 mmol/L (ref 98–111)
Creatinine, Ser: 1.03 mg/dL (ref 0.61–1.24)
GFR calc Af Amer: >60 mL/min (ref >60)
GFR calc non Af Amer: >60 mL/min (ref >60)
Glucose, Bld: 99 mg/dL (ref 70–99)
Potassium: 3.5 mmol/L (ref 3.5–5.1)
Sodium: 142 mmol/L (ref 135–145)
Total Bilirubin: 0.7 mg/dL (ref 0.3–1.2)
Total Protein: 6.5 g/dL (ref 6.5–8.1)

## 2019-07-24 LAB — URINALYSIS, ROUTINE W REFLEX MICROSCOPIC
Bilirubin Urine: NEGATIVE
Glucose, UA: NEGATIVE mg/dL
Hgb urine dipstick: NEGATIVE
Ketones, ur: NEGATIVE mg/dL
Leukocytes,Ua: NEGATIVE
Nitrite: NEGATIVE
Protein, ur: NEGATIVE mg/dL
Specific Gravity, Urine: 1.013 (ref 1.005–1.030)
pH: 6 (ref 5.0–8.0)

## 2019-07-24 LAB — LIPASE, BLOOD: Lipase: 43 U/L (ref 11–51)

## 2019-07-24 LAB — SARS CORONAVIRUS 2 (TAT 6-24 HRS): SARS Coronavirus 2: NEGATIVE

## 2019-07-24 MED ORDER — PREDNISONE 10 MG PO TABS: 20.0000 mg | ORAL_TABLET | Freq: Two times a day (BID) | ORAL | 0 refills | Status: DC

## 2019-07-24 MED ORDER — AZITHROMYCIN 250 MG PO TABS: 250.0000 mg | ORAL_TABLET | Freq: Every day | ORAL | 0 refills | Status: DC

## 2019-07-24 MED ORDER — IBUPROFEN 800 MG PO TABS
800.0000 mg | ORAL_TABLET | Freq: Once | ORAL | Status: AC
  Administered 2019-07-24: 02:00:00 800 mg via ORAL
  Filled 2019-07-24: qty 1

## 2019-07-24 MED ORDER — AZITHROMYCIN 250 MG PO TABS
500.0000 mg | ORAL_TABLET | Freq: Once | ORAL | Status: AC
  Administered 2019-07-24: 03:00:00 500 mg via ORAL
  Filled 2019-07-24: qty 2

## 2019-07-24 NOTE — Discharge Instructions (Addendum)
Begin taking prednisone and Zithromax as prescribed.  Continue use of your albuterol inhaler, 2 puffs every 4 hours as needed.  Quarantine at home until the results of your Covid test are known.  Return to the emergency department in the meantime if your symptoms significantly worsen or change.        Infection Prevention Recommendations for Individuals Confirmed to have, or Being Evaluated for, 2019 Novel Coronavirus (COVID-19) Infection Who Receive Care at Home  Individuals who are confirmed to have, or are being evaluated for, COVID-19 should follow the prevention steps below until a healthcare provider or local or state health department says they can return to normal activities.  Stay home except to get medical care You should restrict activities outside your home, except for getting medical care. Do not go to work, school, or public areas, and do not use public transportation or taxis.  Call ahead before visiting your doctor Before your medical appointment, call the healthcare provider and tell them that you have, or are being evaluated for, COVID-19 infection. This will help the healthcare providers office take steps to keep other people from getting infected. Ask your healthcare provider to call the local or state health department.  Monitor your symptoms Seek prompt medical attention if your illness is worsening (e.g., difficulty breathing). Before going to your medical appointment, call the healthcare provider and tell them that you have, or are being evaluated for, COVID-19 infection. Ask your healthcare provider to call the local or state health department.  Wear a facemask You should wear a facemask that covers your nose and mouth when you are in the same room with other people and when you visit a healthcare provider. People who live with or visit you should also wear a facemask while they are in the same room with you.  Separate yourself from other people in  your home As much as possible, you should stay in a different room from other people in your home. Also, you should use a separate bathroom, if available.  Avoid sharing household items You should not share dishes, drinking glasses, cups, eating utensils, towels, bedding, or other items with other people in your home. After using these items, you should wash them thoroughly with soap and water.  Cover your coughs and sneezes Cover your mouth and nose with a tissue when you cough or sneeze, or you can cough or sneeze into your sleeve. Throw used tissues in a lined trash can, and immediately wash your hands with soap and water for at least 20 seconds or use an alcohol-based hand rub.  Wash your [employer]hands Wash your hands often and thoroughly with soap and water for at least 20 seconds. You can use an alcohol-based hand sanitizer if soap and water are not available and if your hands are not visibly dirty. Avoid touching your eyes, nose, and mouth with unwashed hands.   Prevention Steps for Caregivers and Household Members of Individuals Confirmed to have, or Being Evaluated for, COVID-19 Infection Being Cared for in the Home  If you live with, or provide care at home for, a person confirmed to have, or being evaluated for, COVID-19 infection please follow these guidelines to prevent infection:  Follow healthcare providers instructions Make sure that you understand and can help the patient follow any healthcare provider instructions for all care.  Provide for the patients basic needs You should help the patient with basic needs in the home and provide support for getting groceries, prescriptions, and other personal  needs.  Monitor the patients symptoms If they are getting sicker, call his or her medical provider and tell them that the patient has, or is being evaluated for, COVID-19 infection. This will help the healthcare providers office take steps to keep other people from getting  infected. Ask the healthcare provider to call the local or state health department.  Limit the number of people who have contact with the patient If possible, have only one caregiver for the patient. Other household members should stay in another home or place of residence. If this is not possible, they should stay in another room, or be separated from the patient as much as possible. Use a separate bathroom, if available. Restrict visitors who do not have an essential need to be in the home.  Keep older adults, very young children, and other sick people away from the patient Keep older adults, very young children, and those who have compromised immune systems or chronic health conditions away from the patient. This includes people with chronic heart, lung, or kidney conditions, diabetes, and cancer.  Ensure good ventilation Make sure that shared spaces in the home have good air flow, such as from an air conditioner or an opened window, weather permitting.  Wash your hands often Wash your hands often and thoroughly with soap and water for at least 20 seconds. You can use an alcohol based hand sanitizer if soap and water are not available and if your hands are not visibly dirty. Avoid touching your eyes, nose, and mouth with unwashed hands. Use disposable paper towels to dry your hands. If not available, use dedicated cloth towels and replace them when they become wet.  Wear a facemask and gloves Wear a disposable facemask at all times in the room and gloves when you touch or have contact with the patients blood, body fluids, and/or secretions or excretions, such as sweat, saliva, sputum, nasal mucus, vomit, urine, or feces.  Ensure the mask fits over your nose and mouth tightly, and do not touch it during use. Throw out disposable facemasks and gloves after using them. Do not reuse. Wash your hands immediately after removing your facemask and gloves. If your personal clothing becomes  contaminated, carefully remove clothing and launder. Wash your hands after handling contaminated clothing. Place all used disposable facemasks, gloves, and other waste in a lined container before disposing them with other household waste. Remove gloves and wash your hands immediately after handling these items.  Do not share dishes, glasses, or other household items with the patient Avoid sharing household items. You should not share dishes, drinking glasses, cups, eating utensils, towels, bedding, or other items with a patient who is confirmed to have, or being evaluated for, COVID-19 infection. After the person uses these items, you should wash them thoroughly with soap and water.  Wash laundry thoroughly Immediately remove and wash clothes or bedding that have blood, body fluids, and/or secretions or excretions, such as sweat, saliva, sputum, nasal mucus, vomit, urine, or feces, on them. Wear gloves when handling laundry from the patient. Read and follow directions on labels of laundry or clothing items and detergent. In general, wash and dry with the warmest temperatures recommended on the label.  Clean all areas the individual has used often Clean all touchable surfaces, such as counters, tabletops, doorknobs, bathroom fixtures, toilets, phones, keyboards, tablets, and bedside tables, every day. Also, clean any surfaces that may have blood, body fluids, and/or secretions or excretions on them. Wear gloves when cleaning surfaces the  patient has come in contact with. Use a diluted bleach solution (e.g., dilute bleach with 1 part bleach and 10 parts water) or a household disinfectant with a label that says EPA-registered for coronaviruses. To make a bleach solution at home, add 1 tablespoon of bleach to 1 quart (4 cups) of water. For a larger supply, add  cup of bleach to 1 gallon (16 cups) of water. Read labels of cleaning products and follow recommendations provided on product labels. Labels  contain instructions for safe and effective use of the cleaning product including precautions you should take when applying the product, such as wearing gloves or eye protection and making sure you have good ventilation during use of the product. Remove gloves and wash hands immediately after cleaning.  Monitor yourself for signs and symptoms of illness Caregivers and household members are considered close contacts, should monitor their health, and will be asked to limit movement outside of the home to the extent possible. Follow the monitoring steps for close contacts listed on the symptom monitoring form.   ? If you have additional questions, contact your local health department or call the epidemiologist on call at [phone] (available 24/7). ? This guidance is subject to change. For the most up-to-date guidance from [facility], please refer to their website: [email or web address]

## 2021-03-09 ENCOUNTER — Other Ambulatory Visit: Payer: Self-pay

## 2021-03-09 ENCOUNTER — Encounter (HOSPITAL_COMMUNITY): Payer: Self-pay

## 2021-03-09 ENCOUNTER — Emergency Department (HOSPITAL_COMMUNITY)
Admission: EM | Admit: 2021-03-09 | Discharge: 2021-03-09 | Disposition: A | Discharge status: Home / Self Care | Payer: Self-pay | Attending: Emergency Medicine | Admitting: Emergency Medicine

## 2021-03-09 ENCOUNTER — Emergency Department (HOSPITAL_COMMUNITY): Payer: Self-pay

## 2021-03-09 DIAGNOSIS — Z20822 Contact with and (suspected) exposure to covid-19: Secondary | ICD-10-CM | POA: Insufficient documentation

## 2021-03-09 DIAGNOSIS — J40 Bronchitis, not specified as acute or chronic: Secondary | ICD-10-CM | POA: Insufficient documentation

## 2021-03-09 DIAGNOSIS — Z87891 Personal history of nicotine dependence: Secondary | ICD-10-CM | POA: Insufficient documentation

## 2021-03-09 HISTORY — DX: Bronchitis, not specified as acute or chronic: J40

## 2021-03-09 HISTORY — DX: Unspecified asthma, uncomplicated: J45.909

## 2021-03-09 LAB — RESP PANEL BY RT-PCR (FLU A&B, COVID) ARPGX2
Influenza A by PCR: NEGATIVE
Influenza B by PCR: NEGATIVE
SARS Coronavirus 2 by RT PCR: NEGATIVE

## 2021-03-09 MED ORDER — IPRATROPIUM BROMIDE HFA 17 MCG/ACT IN AERS
2.0000 | INHALATION_SPRAY | Freq: Once | RESPIRATORY_TRACT | Status: AC
  Administered 2021-03-09: 2 via RESPIRATORY_TRACT
  Filled 2021-03-09: qty 12.9

## 2021-03-09 MED ORDER — PREDNISONE 10 MG (21) PO TBPK: ORAL_TABLET | Freq: Every day | ORAL | 0 refills | Status: DC

## 2021-03-09 MED ORDER — ALBUTEROL SULFATE HFA 108 (90 BASE) MCG/ACT IN AERS
4.0000 | INHALATION_SPRAY | Freq: Once | RESPIRATORY_TRACT | Status: AC
  Administered 2021-03-09: 4 via RESPIRATORY_TRACT

## 2021-03-09 MED ORDER — ALBUTEROL SULFATE (2.5 MG/3ML) 0.083% IN NEBU: 2.5000 mg | INHALATION_SOLUTION | Freq: Four times a day (QID) | RESPIRATORY_TRACT | 0 refills | Status: DC | PRN

## 2021-03-09 MED ORDER — PREDNISONE 20 MG PO TABS
60.0000 mg | ORAL_TABLET | Freq: Once | ORAL | Status: AC
  Administered 2021-03-09: 60 mg via ORAL
  Filled 2021-03-09: qty 3

## 2021-03-09 MED ORDER — ALBUTEROL SULFATE HFA 108 (90 BASE) MCG/ACT IN AERS
2.0000 | INHALATION_SPRAY | RESPIRATORY_TRACT | Status: DC | PRN
  Administered 2021-03-09 (×2): 2 via RESPIRATORY_TRACT
  Filled 2021-03-09 (×2): qty 6.7

## 2021-03-09 NOTE — ED Provider Notes (Signed)
[facility]-EMERGENCY DEPT Provider Note   CSN: [id number] Arrival date & time: 03/09/21  1437     History Chief Complaint  Patient presents with  . Asthma    Keith Swanson is a 45 y.o. male.  HPI Patient is a 45 year old male with past medical history significant for please suspect his asthma although he states he has never been formally diagnosed with this or had any PFTs done.  He states he gets episodes of bronchitis several times per year.  He states that he has run out of his albuterol inhaler which she usually has on hand from prior bronchitis/asthma exacerbations.  He denies any fevers or chills.  He states that his symptoms been ongoing for approximately 1 week.  He states he has been wheezing, coughing, occasionally feels that he is wheezing and coughing so much that he feels short of breath.  He states his chest hurts when he coughs forcefully.  Denies any nausea vomiting or nontoxic of chest pain.  No recent surgeries, hospitalization, long travel, hemoptysis, estrogen containing OCP, cancer history.  No unilateral leg swelling.  No history of PE or VTE.      Past Medical History:  Diagnosis Date  . Anaphylaxis   . Asthma   . Bronchitis   . Low back pain     Patient Active Problem List   Diagnosis Date Noted  . Dental infection   . 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 Streptococcal infection group A 12/18/2018    Past Surgical History:  Procedure Laterality Date  . FACIAL FRACTURE SURGERY    . WISDOM TOOTH EXTRACTION         Family History  Problem Relation Age of Onset  . Hypertension Mother   . Heart attack Father   . Cancer Other     Social History   Tobacco Use  . Smoking status: Former Smoker    Packs/day: 0.50    Types: Cigarettes  . Smokeless tobacco: Never Used  Vaping Use  . Vaping Use: Never used  Substance Use Topics  . Alcohol use: Not Currently  . Drug use: No    Home Medications Prior  to Admission medications   Medication Sig Start Date End Date Taking? Authorizing Provider  predniSONE (STERAPRED UNI-PAK 21 TAB) 10 MG (21) TBPK tablet Take by mouth daily. Take 6 tabs by mouth daily  for 2 days, then 5 tabs for 2 days, then 4 tabs for 2 days, then 3 tabs for 2 days, 2 tabs for 2 days, then 1 tab by mouth daily for 2 days 03/09/21  Yes Matalynn Graff S, PA  albuterol (VENTOLIN HFA) 108 (90 Base) MCG/ACT inhaler Inhale 1-2 puffs into the lungs every 6 (six) hours as needed for wheezing or shortness of breath. 01/18/19   Lorre Nick, MD  azithromycin (ZITHROMAX) 250 MG tablet Take 1 tablet (250 mg total) by mouth daily. 07/24/19   Geoffery Lyons, MD  EPINEPHrine 0.3 mg/0.3 mL IJ SOAJ injection Inject 0.3 mLs (0.3 mg total) into the muscle as needed (as needed for signs of anaphylaxys, call md prior to administratin, have this available on the floor). Patient not taking: Reported on 07/23/2019 12/21/18   [place], Alvira Philips, DO  PE-Doxylamine-DM-GG-APAP (TYLENOL COLD & FLU DAY/NIGHT PO) Take 2 capsules by mouth every 6 (six) hours as needed (cold symtpoms).    [provider]    Allergies    Penicillins and Doxycycline  Review of Systems   Review of Systems  Constitutional: Negative for chills and fever.  HENT: Negative for congestion.   Eyes: Negative for pain.  Respiratory: Positive for cough, shortness of breath and wheezing.   Cardiovascular: Negative for chest pain and leg swelling.  Gastrointestinal: Negative for abdominal pain and vomiting.  Genitourinary: Negative for dysuria.  Musculoskeletal: Negative for myalgias.  Skin: Negative for rash.  Neurological: Negative for dizziness and headaches.    Physical Exam Updated Vital Signs BP 127/87   Pulse 76   Temp 98.2 F (36.8 C) (Oral)   Resp 18   Ht 6' (1.829 m)   Wt 98.4 kg   SpO2 95%   BMI 29.43 kg/m   Physical Exam Vitals and nursing note reviewed.  Constitutional:      General: He is not in acute  distress. HENT:     Head: Normocephalic and atraumatic.     Nose: Nose normal.  Eyes:     General: No scleral icterus. Cardiovascular:     Rate and Rhythm: Normal rate and regular rhythm.     Pulses: Normal pulses.     Heart sounds: Normal heart sounds.  Pulmonary:     Effort: Pulmonary effort is normal. No respiratory distress.     Breath sounds: Wheezing present.     Comments: Coarse lung sounds with wheezing throughout, speaking full sentences. Abdominal:     Palpations: Abdomen is soft.     Tenderness: There is no abdominal tenderness.  Musculoskeletal:     Cervical back: Normal range of motion.     Right lower leg: No edema.     Left lower leg: No edema.  Skin:    General: Skin is warm and dry.     Capillary Refill: Capillary refill takes less than 2 seconds.  Neurological:     Mental Status: He is alert. Mental status is at baseline.  Psychiatric:        Mood and Affect: Mood normal.        Behavior: Behavior normal.     ED Results / Procedures / Treatments   Labs (all labs ordered are listed, but only abnormal results are displayed) Labs Reviewed  RESP PANEL BY RT-PCR (FLU A&B, COVID) [id number]    EKG None  Radiology DG Chest 2 View  Result Date: 03/09/2021 CLINICAL DATA:  Cough, shortness of breath EXAM: CHEST - 2 VIEW COMPARISON:  09/30/2020 FINDINGS: Lungs are clear.  No pleural effusion or pneumothorax. The heart is normal in size. Visualized osseous structures are within normal limits. IMPRESSION: Normal chest radiographs. Electronically Signed   By: Charline Bills M.D.   On: 03/09/2021 19:05    Procedures Procedures   Medications Ordered in ED Medications  albuterol (VENTOLIN HFA) 108 (90 Base) MCG/ACT inhaler 2 puff (2 puffs Inhalation Given 03/09/21 1831)  predniSONE (DELTASONE) tablet 60 mg (60 mg Oral Given 03/09/21 1733)  ipratropium (ATROVENT HFA) inhaler 2 puff (2 puffs Inhalation Given 03/09/21 1837)  albuterol (VENTOLIN HFA) 108 (90 Base) MCG/ACT  inhaler 4 puff (4 puffs Inhalation Given 03/09/21 1733)    ED Course  I have reviewed the triage vital signs and the nursing notes.  Pertinent labs & imaging results that were available during my care of the patient were reviewed by me and considered in my medical decision making (see chart for details).    MDM Rules/Calculators/A&P                          Patient is 45 year old male  never formally diagnosed with asthma but seems to have symptoms consistent with this with multiple flareups per year.  Has been diagnosed with either bronchitis or asthma in the past for the symptoms.   Today is well-appearing but coarse lung sounds and wheezing throughout.  He is not tachypneic or hypoxic however.  Given 1 dose of prednisone albuterol and ipratropium with nearly complete resolution of his symptoms although he still does feel that he is coughing some.  Denies any fevers or chills.  COVID influenza negative chest x-ray unremarkable with no infiltrate or abnormality.  On reexamination patient is no longer wheezing although he does still have some coarse lung sounds.  Normal chest radiographs on my review with no infiltrate.  Will discharge home with prednisone taper and follow-up with [facility].  Final Clinical Impression(s) / ED Diagnoses Final diagnoses:  Bronchitis    Rx / DC Orders ED Discharge Orders         Ordered    predniSONE (STERAPRED UNI-PAK 21 TAB) 10 MG (21) TBPK tablet  Daily        03/09/21 1946           Gailen Shelter, [place] 03/09/21 1949    Jacalyn Lefevre, MD 03/09/21 2013

## 2021-03-09 NOTE — Discharge Instructions (Addendum)
Given that you have never been formally diagnosed with asthma I suspect this is bronchitis.  However I would like you to closely follow-up with a primary care provider.  Since you do not have 1 please follow-up with the [facility].  I have given you the information for their office.  Please take the prednisone taper as prescribed and please use the albuterol inhaler as prescribed.  You may always return to the ER for any new or concerning symptoms.

## 2021-03-09 NOTE — ED Provider Notes (Signed)
Emergency Medicine Provider Triage Evaluation Note  Keith Swanson , a 45 y.o. male  was evaluated in triage.  Pt complains of wheezing.  Review of Systems  Positive: Cough, wheezing Negative: Fever chills  Physical Exam  BP (!) 135/98 (BP Location: Right Arm)   Pulse 74   Temp 98.2 F (36.8 C) (Oral)   Resp 18   Ht 6' (1.829 m)   Wt 98.4 kg   SpO2 100%   BMI 29.43 kg/m  Gen:   Awake, no distress  Resp:  Mild tachypnea with rate of 24.  Speaking full sentences but coughing and wheezing MSK:   Moves extremities without difficulty  Other:    Medical Decision Making  Medically screening exam initiated at 5:21 PM.  Appropriate orders placed.  [name] was informed that the remainder of the evaluation will be completed by another provider, this initial triage assessment does not replace that evaluation, and the importance of remaining in the ED until their evaluation is complete.  Patient is uncomfortable appearing.  Will attempt to get moved to room with the help of RN to treat likely bronchitis/asthma exacerbation.   Solon Augusta Huson, [place] 03/09/21 1739    Jacalyn Lefevre, MD 03/09/21 Flossie Buffy

## 2021-03-09 NOTE — ED Triage Notes (Signed)
Patient c/o SOB and states a history of bronchitis and asthma. Patient states he is out of his inhaler and nebulizer bullets.

## 2021-03-27 ENCOUNTER — Emergency Department (HOSPITAL_COMMUNITY)
Admission: EM | Admit: 2021-03-27 | Discharge: 2021-03-27 | Disposition: A | Discharge status: Home / Self Care | Payer: Medicaid Other | Attending: Emergency Medicine | Admitting: Emergency Medicine

## 2021-03-27 ENCOUNTER — Encounter (HOSPITAL_COMMUNITY): Payer: Self-pay

## 2021-03-27 ENCOUNTER — Other Ambulatory Visit: Payer: Self-pay

## 2021-03-27 DIAGNOSIS — H209 Unspecified iridocyclitis: Secondary | ICD-10-CM | POA: Insufficient documentation

## 2021-03-27 DIAGNOSIS — S0502XA Injury of conjunctiva and corneal abrasion without foreign body, left eye, initial encounter: Secondary | ICD-10-CM | POA: Insufficient documentation

## 2021-03-27 DIAGNOSIS — Z87891 Personal history of nicotine dependence: Secondary | ICD-10-CM | POA: Insufficient documentation

## 2021-03-27 DIAGNOSIS — W228XXA Striking against or struck by other objects, initial encounter: Secondary | ICD-10-CM | POA: Insufficient documentation

## 2021-03-27 DIAGNOSIS — J45909 Unspecified asthma, uncomplicated: Secondary | ICD-10-CM | POA: Insufficient documentation

## 2021-03-27 DIAGNOSIS — H538 Other visual disturbances: Secondary | ICD-10-CM | POA: Insufficient documentation

## 2021-03-27 DIAGNOSIS — H5712 Ocular pain, left eye: Secondary | ICD-10-CM

## 2021-03-27 LAB — BASIC METABOLIC PANEL
Anion gap: 8 (ref 5–15)
BUN: 12 mg/dL (ref 6–20)
CO2: 22 mmol/L (ref 22–32)
Calcium: 9.1 mg/dL (ref 8.9–10.3)
Chloride: 107 mmol/L (ref 98–111)
Creatinine, Ser: 0.95 mg/dL (ref 0.61–1.24)
GFR, Estimated: >60 mL/min (ref >60)
Glucose, Bld: 131 mg/dL — ABNORMAL HIGH (ref 70–99)
Potassium: 3.8 mmol/L (ref 3.5–5.1)
Sodium: 137 mmol/L (ref 135–145)

## 2021-03-27 LAB — CBC
HCT: 46.3 % (ref 39.0–52.0)
Hemoglobin: 15.6 g/dL (ref 13.0–17.0)
MCH: 29.8 pg (ref 26.0–34.0)
MCHC: 33.7 g/dL (ref 30.0–36.0)
MCV: 88.4 fL (ref 80.0–100.0)
Platelets: 229 10*3/uL (ref 150–400)
RBC: 5.24 MIL/uL (ref 4.22–5.81)
RDW: 13.1 % (ref 11.5–15.5)
WBC: 9 10*3/uL (ref 4.0–10.5)
nRBC: 0 % (ref 0.0–0.2)

## 2021-03-27 LAB — CBG MONITORING, ED: Glucose-Capillary: 123 mg/dL — ABNORMAL HIGH (ref 70–99)

## 2021-03-27 MED ORDER — OXYCODONE-ACETAMINOPHEN 5-325 MG PO TABS: 1.0000 | ORAL_TABLET | Freq: Four times a day (QID) | ORAL | 0 refills | Status: DC | PRN

## 2021-03-27 MED ORDER — TETRACAINE HCL 0.5 % OP SOLN
2.0000 [drp] | Freq: Once | OPHTHALMIC | Status: AC
  Administered 2021-03-27: 2 [drp] via OPHTHALMIC
  Filled 2021-03-27: qty 4

## 2021-03-27 MED ORDER — FLUORESCEIN SODIUM 1 MG OP STRP
1.0000 | ORAL_STRIP | Freq: Once | OPHTHALMIC | Status: AC
  Administered 2021-03-27: 1 via OPHTHALMIC
  Filled 2021-03-27: qty 1

## 2021-03-27 MED ORDER — MAXITROL 3.5-10000-0.1 OP OINT: 1.0000 "application " | TOPICAL_OINTMENT | Freq: Four times a day (QID) | OPHTHALMIC | 1 refills | Status: AC

## 2021-03-27 NOTE — ED Notes (Signed)
PA-C notified and aware of pt's visual acuity results.  R: 20/25 L: 20/100 B: 20/25

## 2021-03-27 NOTE — ED Provider Notes (Signed)
[facility]-EMERGENCY DEPT Provider Note   CSN: [id number] Arrival date & time: 03/27/21  1439     History Chief Complaint  Patient presents with   Loss of Consciousness   Eye Pain    Keith Swanson is a 45 y.o. male.  HPI     Keith Swanson is a 45 y.o. male, with a history of asthma, presenting to the ED with left eye injury that occurred Sunday, June 19. He states he was opening a bottle with a bottle forcefully struck him in his left eye.  He experienced pain thereafter in the left eye, but this continued to worsen, especially over the last 24 hours.  Over the last 24 hours he has also developed blurry vision, photophobia, and swelling on the left.  Tearing, but no hemorrhage or other drainage. He denies contact lens use.  Earlier today, he applied ice over the left eye, became lightheaded, and "passed out briefly, maybe a few seconds."  He states he did not sustain any injuries from this.  He thinks this occurred due to his increased pain.  Denies fever, weakness, numbness, nausea/vomiting, neck/back pain, chest pain, shortness of breath, continued lightheadedness/dizziness, or any other complaints.   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   Streptococcal infection group A 12/18/2018    Past Surgical History:  Procedure Laterality Date   FACIAL FRACTURE SURGERY     WISDOM TOOTH EXTRACTION         Family History  Problem Relation Age of Onset   Hypertension Mother    Heart attack Father    Cancer Other     Social History   Tobacco Use   Smoking status: Former    Packs/day: 0.50    Pack years: 0.00    Types: Cigarettes   Smokeless tobacco: Never  Vaping Use   Vaping Use: Never used  Substance Use Topics   Alcohol use: Not Currently   Drug use: No     Home Medications Prior to Admission medications   Medication Sig Start Date End Date Taking? Authorizing Provider  neomycin-polymyxin-dexameth (MAXITROL) 0.1 % OINT Place 1 application into the left eye 4 (four) times daily for 7 days. 03/27/21 04/03/21 Yes Aneisha Skyles C, PA-C  oxyCODONE-acetaminophen (PERCOCET/ROXICET) 5-325 MG tablet Take 1 tablet by mouth every 6 (six) hours as needed for severe pain. 03/27/21  Yes Beatrice Sehgal C, PA-C  albuterol (PROVENTIL) (2.5 MG/3ML) 0.083% nebulizer solution Take 3 mLs (2.5 mg total) by nebulization every 6 (six) hours as needed for wheezing or shortness of breath. 03/09/21   Gailen ShelterFondaw, Wylder S, PA  albuterol (VENTOLIN HFA) 108 (90 Base) MCG/ACT inhaler Inhale 1-2 puffs into the lungs every 6 (six) hours as needed for wheezing or shortness of breath. 01/18/19   Lorre NickAllen, Anthony, MD  azithromycin (ZITHROMAX) 250 MG tablet Take 1 tablet (250 mg total) by mouth daily. 07/24/19   Geoffery Lyonselo, Douglas, MD  EPINEPHrine 0.3 mg/0.3 mL IJ SOAJ injection Inject 0.3 mLs (0.3 mg total) into the muscle as needed (as needed for signs of anaphylaxys, call md prior to administratin, have this available on the floor). Patient not taking: Reported on 07/23/2019 12/21/18   [place], Alvira Philipsric J, DO  PE-Doxylamine-DM-GG-APAP (TYLENOL COLD & FLU DAY/NIGHT PO) Take 2  capsules by mouth every 6 (six) hours as needed (cold symtpoms).    [provider]  predniSONE (STERAPRED UNI-PAK 21 TAB) 10 MG (21) TBPK tablet Take by mouth daily. Take 6 tabs by mouth daily  for 2 days, then 5 tabs for 2 days, then 4 tabs for 2 days, then 3 tabs for 2 days, 2 tabs for 2 days, then 1 tab by mouth daily for 2 days 03/09/21   Solon Augusta S, PA    Allergies    Penicillins and Doxycycline  Review of Systems   Review of Systems  Constitutional:  Negative for diaphoresis and fever.  HENT:  Positive for facial swelling.   Eyes:  Positive for photophobia, pain, redness and visual disturbance.  Respiratory:   Negative for shortness of breath.   Cardiovascular:  Negative for chest pain.  Gastrointestinal:  Negative for abdominal pain, diarrhea, nausea and vomiting.  Musculoskeletal:  Negative for back pain and neck pain.  Neurological:  Positive for light-headedness (resolved). Negative for dizziness and weakness.  All other systems reviewed and are negative.  Physical Exam Updated Vital Signs BP (!) 142/99 (BP Location: Right Arm)   Pulse 65   Temp 98 F (36.7 C) (Oral)   Resp 18   Ht 6' (1.829 m)   Wt 99.8 kg   SpO2 99%   BMI 29.84 kg/m   Physical Exam Vitals and nursing note reviewed.  Constitutional:      General: He is not in acute distress.    Appearance: He is well-developed. He is not diaphoretic.  HENT:     Head: Normocephalic and atraumatic.     Mouth/Throat:     Mouth: Mucous membranes are moist.     Pharynx: Oropharynx is clear.  Eyes:     Extraocular Movements: Extraocular movements intact.     Conjunctiva/sclera: Conjunctivae normal.     Pupils: Pupils are equal, round, and reactive to light.     Comments: No contact lenses in place.  EOMs are intact and in sync.  No pain with EOMs.  No noted eye or lid ptosis.  Pupils are PERRL.  No hyphema.  Iris appears to be round and without deformity. Slit lamp exam was also performed with increased fluorescein uptake in the 6 o'clock position of the cornea of the left eye as well as around the 2 o'clock position.  Additionally, there appears to be what looks like bruising at the 3 o'clock position of the border of the iris. No noted signs of hyphema, anterior chamber damage, foreign bodies, or globe damage.   Tono-Pen values: Right eye: 11  Left eye: 13  Visual acuity R: 20/25 L: 20/100 B: 20/25     Cardiovascular:     Rate and Rhythm: Normal rate and regular rhythm.     Pulses: Normal pulses.          Radial pulses are 2+ on the right side and 2+ on the left side.     Heart sounds: Normal heart sounds.  Pulmonary:      Effort: Pulmonary effort is normal. No respiratory distress.     Breath sounds: Normal breath sounds.  Abdominal:     Palpations: Abdomen is soft.     Tenderness: There is no abdominal tenderness. There is no guarding.  Musculoskeletal:     Cervical back: Normal range of motion and neck supple. No tenderness.     Right lower leg: No edema.     Left lower leg: No edema.  Comments: Normal motor function intact in all extremities. No midline spinal tenderness.   Skin:    General: Skin is warm and dry.  Neurological:     Mental Status: He is alert.     Comments: No noted acute cognitive deficit. Sensation grossly intact to light touch in the extremities.   Grip strengths equal bilaterally.   Strength 5/5 in all extremities.  No gait disturbance.  Coordination intact.  Cranial nerves III-XII grossly intact.  Handles oral secretions without noted difficulty.  No noted phonation or speech deficit. No facial droop.   Psychiatric:        Mood and Affect: Mood and affect normal.        Speech: Speech normal.        Behavior: Behavior normal.    ED Results / Procedures / Treatments   Labs (all labs ordered are listed, but only abnormal results are displayed) Labs Reviewed  BASIC METABOLIC PANEL - Abnormal; Notable for the following components:      Result Value   Glucose, Bld 131 (*)    All other components within normal limits  CBG MONITORING, ED - Abnormal; Notable for the following components:   Glucose-Capillary 123 (*)    All other components within normal limits  CBC    EKG None  ED ECG REPORT   Date: 03/27/2021  Rate: 65  Rhythm: normal sinus rhythm  QRS Axis: normal  Intervals: normal  ST/T Wave abnormalities: normal  Conduction Disutrbances:none  Narrative Interpretation:   Old EKG Reviewed: unchanged  I have personally reviewed the EKG tracing and disagree with the computerized printout as noted.  Radiology No results found.  Procedures Procedures    Medications Ordered in ED Medications  fluorescein ophthalmic strip 1 strip (1 strip Left Eye Given by Other 03/27/21 1503)  tetracaine (PONTOCAINE) 0.5 % ophthalmic solution 2 drop (2 drops Both Eyes Given by Other 03/27/21 1503)    ED Course  I have reviewed the triage vital signs and the nursing notes.  Pertinent labs & imaging results that were available during my care of the patient were reviewed by me and considered in my medical decision making (see chart for details).  Clinical Course as of 03/27/21 1823  Fri Mar 27, 2021  1621 Spoke with Dr. Jenene Slicker, ophthalmologist.  Recommends patient be prescribed Maxitrol or TobraDex ointment 4 times daily for 7 days.  She can see him in the office Monday, June 27 around 2 PM.  He should call the office Monday morning to set up the appointment for later in the afternoon.  [phone] [SJ]    Clinical Course User Index [SJ] Sylvain Hasten, Hillard Danker, PA-C   MDM Rules/Calculators/A&P                          Patient presents with injury to the left eye that occurred several days ago.  Suspect iritis. Patient will have close follow-up with ophthalmology. Patient states he "passed out" for a couple seconds.  He did not hit the ground.  He denies any injuries.  We discussed further imaging, such as CT head, but he ultimately declined. The patient was given instructions for home care as well as return precautions. Patient voices understanding of these instructions, accepts the plan, and is comfortable with discharge.  I reviewed and interpreted the patient's labs and radiological studies.   Findings and plan of care discussed with attending physician, Arby Barrette, MD.   Final  Clinical Impression(s) / ED Diagnoses Final diagnoses:  Iritis of left eye  Abrasion of left cornea, initial encounter  Pain of left eye    Rx / DC Orders ED Discharge Orders          Ordered    neomycin-polymyxin-dexameth (MAXITROL) 0.1 % OINT  4 times daily         03/27/21 1807    oxyCODONE-acetaminophen (PERCOCET/ROXICET) 5-325 MG tablet  Every 6 hours PRN        03/27/21 1808             Anselm Pancoast, PA-C 03/27/21 1827    Arby Barrette, MD 04/01/21 1131

## 2021-03-27 NOTE — ED Triage Notes (Signed)
Pt reports opening a bottle on Sunday and the lid hit his left eye. Pt reports eye pain, swelling, and photosensitivity. Pt states his right eye started to become photosensitive on Wednesday and has pain radiating from right eye to his forehead. He also reports a syncopal episode that occurred this morning from the severity of pain. Pt denies any injuries from this episode.

## 2021-03-27 NOTE — Discharge Instructions (Addendum)
  Apply the combination antibiotic/steroid eye ointment 4 times daily for the next 7 days.  Antiinflammatory medications: Take 600 mg of ibuprofen every 6 hours or 440 mg (over the counter dose) to 500 mg (prescription dose) of naproxen every 12 hours for the next 3 days. After this time, these medications may be used as needed for pain. Take these medications with food to avoid upset stomach. Choose only one of these medications, do not take them together. Acetaminophen (generic for Tylenol): Should you continue to have additional pain while taking the ibuprofen or naproxen, you may add in acetaminophen as needed. Your daily total maximum amount of acetaminophen from all sources should be limited to 4000mg /day for persons without liver problems, or 2000mg /day for those with liver problems. Percocet: May take Percocet (oxycodone-acetaminophen) as needed for severe pain.   Do not drive or perform other dangerous activities while taking this medication as it can cause drowsiness as well as changes in reaction time and judgement.  Please note that each pill of Percocet contains 325 mg of acetaminophen (generic for Tylenol) and the above dosage limits apply.  Follow-up with the ophthalmologist (eye doctor) on Monday, June 27.  Call the office Monday morning to set up an appointment to be seen Monday afternoon (probably around 2 or 3 pm).  Return to the emergency department for further loss of vision, worsening swelling, worsening pain, or any other major concerns.

## 2021-04-07 ENCOUNTER — Encounter (HOSPITAL_COMMUNITY): Payer: Self-pay

## 2021-04-07 ENCOUNTER — Emergency Department (HOSPITAL_COMMUNITY): Payer: Self-pay

## 2021-04-07 ENCOUNTER — Other Ambulatory Visit: Payer: Self-pay

## 2021-04-07 ENCOUNTER — Emergency Department (HOSPITAL_COMMUNITY)
Admission: EM | Admit: 2021-04-07 | Discharge: 2021-04-08 | Disposition: A | Discharge status: Home / Self Care | Payer: Self-pay | Attending: Emergency Medicine | Admitting: Emergency Medicine

## 2021-04-07 DIAGNOSIS — R519 Headache, unspecified: Secondary | ICD-10-CM | POA: Insufficient documentation

## 2021-04-07 DIAGNOSIS — J9801 Acute bronchospasm: Secondary | ICD-10-CM | POA: Insufficient documentation

## 2021-04-07 DIAGNOSIS — Z20822 Contact with and (suspected) exposure to covid-19: Secondary | ICD-10-CM | POA: Insufficient documentation

## 2021-04-07 DIAGNOSIS — Z87891 Personal history of nicotine dependence: Secondary | ICD-10-CM | POA: Insufficient documentation

## 2021-04-07 DIAGNOSIS — R0602 Shortness of breath: Secondary | ICD-10-CM

## 2021-04-07 LAB — CBC WITH DIFFERENTIAL/PLATELET
Abs Immature Granulocytes: 0.05 10*3/uL (ref 0.00–0.07)
Basophils Absolute: 0.1 10*3/uL (ref 0.0–0.1)
Basophils Relative: 1 %
Eosinophils Absolute: 0.3 10*3/uL (ref 0.0–0.5)
Eosinophils Relative: 4 %
HCT: 44.5 % (ref 39.0–52.0)
Hemoglobin: 15.3 g/dL (ref 13.0–17.0)
Immature Granulocytes: 1 %
Lymphocytes Relative: 34 %
Lymphs Abs: 3.2 10*3/uL (ref 0.7–4.0)
MCH: 29.8 pg (ref 26.0–34.0)
MCHC: 34.4 g/dL (ref 30.0–36.0)
MCV: 86.6 fL (ref 80.0–100.0)
Monocytes Absolute: 0.9 10*3/uL (ref 0.1–1.0)
Monocytes Relative: 10 %
Neutro Abs: 5 10*3/uL (ref 1.7–7.7)
Neutrophils Relative %: 50 %
Platelets: 251 10*3/uL (ref 150–400)
RBC: 5.14 MIL/uL (ref 4.22–5.81)
RDW: 12.9 % (ref 11.5–15.5)
WBC: 9.5 10*3/uL (ref 4.0–10.5)
nRBC: 0 % (ref 0.0–0.2)

## 2021-04-07 LAB — BASIC METABOLIC PANEL
Anion gap: 13 (ref 5–15)
BUN: 9 mg/dL (ref 6–20)
CO2: 23 mmol/L (ref 22–32)
Calcium: 9.1 mg/dL (ref 8.9–10.3)
Chloride: 105 mmol/L (ref 98–111)
Creatinine, Ser: 1.22 mg/dL (ref 0.61–1.24)
GFR, Estimated: >60 mL/min (ref >60)
Glucose, Bld: 110 mg/dL — ABNORMAL HIGH (ref 70–99)
Potassium: 3.8 mmol/L (ref 3.5–5.1)
Sodium: 141 mmol/L (ref 135–145)

## 2021-04-07 MED ORDER — KETOROLAC TROMETHAMINE 15 MG/ML IJ SOLN
15.0000 mg | Freq: Once | INTRAMUSCULAR | Status: AC
  Administered 2021-04-07: 15 mg via INTRAVENOUS
  Filled 2021-04-07: qty 1

## 2021-04-07 MED ORDER — ALBUTEROL (5 MG/ML) CONTINUOUS INHALATION SOLN
10.0000 mg/h | INHALATION_SOLUTION | RESPIRATORY_TRACT | Status: DC
  Administered 2021-04-08: 10 mg/h via RESPIRATORY_TRACT
  Filled 2021-04-07: qty 20

## 2021-04-07 MED ORDER — DEXAMETHASONE SODIUM PHOSPHATE 10 MG/ML IJ SOLN
10.0000 mg | Freq: Once | INTRAMUSCULAR | Status: AC
  Administered 2021-04-07: 10 mg via INTRAVENOUS
  Filled 2021-04-07: qty 1

## 2021-04-07 NOTE — ED Triage Notes (Signed)
Pt states that he has been having a cough for a week that has been progressing. Today, at the plasma center he started having shortness of breath. Pt also complains of a headache.

## 2021-04-07 NOTE — ED Provider Notes (Signed)
[facility]-EMERGENCY DEPT Provider Note: Lowella Dell, MD, FACEP  CSN: [id number] MRN: [id number] ARRIVAL: 04/07/21 at 2226 ROOM: WA09/WA09   CHIEF COMPLAINT  Cough and Shortness of Breath   HISTORY OF PRESENT ILLNESS  04/07/21 11:36 PM Keith Swanson is a 45 y.o. male with a history of asthma.  He is here with a cough that has been progressing all week.  Today, while preparing to plasma, he developed shortness of breath.  The facility called EMS and he was brought here.  He was given neb treatment prior to arrival with transient relief.  Shortness of breath is moderate, worse with lying supine or exertion.  He is also having a headache which he rates as a 10 out of 10.  It is worse with coughing.  His chest feels tight when he breathes.  He injured his left eye on June 19 and was seen in the ED on June 24.   Past Medical History:  Diagnosis Date   Anaphylaxis    Asthma    Bronchitis    Low back pain     Past Surgical History:  Procedure Laterality Date   FACIAL FRACTURE SURGERY     WISDOM TOOTH EXTRACTION      Family History  Problem Relation Age of Onset   Hypertension Mother    Heart attack Father    Cancer Other     Social History   Tobacco Use   Smoking status: Former    Packs/day: 0.50    Pack years: 0.00    Types: Cigarettes   Smokeless tobacco: Never  Vaping Use   Vaping Use: Never used  Substance Use Topics   Alcohol use: Not Currently   Drug use: No    Prior to Admission medications   Medication Sig Start Date End Date Taking? Authorizing Provider  albuterol (PROVENTIL) (2.5 MG/3ML) 0.083% nebulizer solution Take 3 mLs (2.5 mg total) by nebulization every 6 (six) hours as needed for wheezing or shortness of breath. 03/09/21  Yes Fondaw, Wylder S, PA  guaiFENesin (ROBITUSSIN) 100 MG/5ML liquid Take 400 mg by mouth 3 (three) times daily as needed for cough.   Yes [provider]  PE-Doxylamine-DM-GG-APAP (TYLENOL COLD & FLU DAY/NIGHT PO) Take 2  capsules by mouth every 6 (six) hours as needed (cold symtpoms).   Yes [provider]  EPINEPHrine 0.3 mg/0.3 mL IJ SOAJ injection Inject 0.3 mLs (0.3 mg total) into the muscle as needed (as needed for signs of anaphylaxys, call md prior to administratin, have this available on the floor). Patient not taking: No sig reported 12/21/18   [place], Eric J, DO    Allergies Penicillins and Doxycycline   REVIEW OF SYSTEMS  Negative except as noted here or in the History of Present Illness.   PHYSICAL EXAMINATION  Initial Vital Signs Blood pressure (!) 144/80, pulse 84, temperature 99.1 F (37.3 C), temperature source Oral, resp. rate (!) 28, height 6' (1.829 m), weight 98.4 kg, SpO2 93 %.  Examination General: Well-developed, well-nourished male in no acute distress; appearance consistent with age of record HENT: normocephalic; atraumatic Eyes: Grossly normal but patient would not tolerate formal examination due to photophobia Neck: supple Heart: regular rate and rhythm Lungs: Inspiratory and expiratory wheezing; tachypnea Abdomen: soft; nondistended; nontender; bowel sounds present Extremities: No deformity; full range of motion; pulses normal Neurologic: Awake, alert and oriented; motor function intact in all extremities and symmetric; no facial droop Skin: Warm and dry Psychiatric: Flat affect   RESULTS  Summary  of this visit's results, reviewed and interpreted by myself:   EKG Interpretation  Date/Time:    Ventricular Rate:    PR Interval:    QRS Duration:   QT Interval:    QTC Calculation:   R Axis:     Text Interpretation:          Laboratory Studies: Results for orders placed or performed during the hospital encounter of 04/07/21 (from the past 24 hour(s))  Resp Panel by RT-PCR (Flu A&B, Covid) Nasopharyngeal Swab     Status: None   Collection Time: 04/07/21 11:09 PM   Specimen: Nasopharyngeal Swab; Nasopharyngeal(NP) swabs in vial transport medium   Result Value Ref Range   SARS Coronavirus 2 by RT PCR NEGATIVE NEGATIVE   Influenza A by PCR NEGATIVE NEGATIVE   Influenza B by PCR NEGATIVE NEGATIVE  CBC with Differential/Platelet     Status: None   Collection Time: 04/07/21 11:44 PM  Result Value Ref Range   WBC 9.5 4.0 - 10.5 K/uL   RBC 5.14 4.22 - 5.81 MIL/uL   Hemoglobin 15.3 13.0 - 17.0 g/dL   HCT 68.3 41.9 - 62.2 %   MCV 86.6 80.0 - 100.0 fL   MCH 29.8 26.0 - 34.0 pg   MCHC 34.4 30.0 - 36.0 g/dL   RDW 29.7 98.9 - 21.1 %   Platelets 251 150 - 400 K/uL   nRBC 0.0 0.0 - 0.2 %   Neutrophils Relative % 50 %   Neutro Abs 5.0 1.7 - 7.7 K/uL   Lymphocytes Relative 34 %   Lymphs Abs 3.2 0.7 - 4.0 K/uL   Monocytes Relative 10 %   Monocytes Absolute 0.9 0.1 - 1.0 K/uL   Eosinophils Relative 4 %   Eosinophils Absolute 0.3 0.0 - 0.5 K/uL   Basophils Relative 1 %   Basophils Absolute 0.1 0.0 - 0.1 K/uL   Immature Granulocytes 1 %   Abs Immature Granulocytes 0.05 0.00 - 0.07 K/uL  Basic metabolic panel     Status: Abnormal   Collection Time: 04/07/21 11:44 PM  Result Value Ref Range   Sodium 141 135 - 145 mmol/L   Potassium 3.8 3.5 - 5.1 mmol/L   Chloride 105 98 - 111 mmol/L   CO2 23 22 - 32 mmol/L   Glucose, Bld 110 (H) 70 - 99 mg/dL   BUN 9 6 - 20 mg/dL   Creatinine, Ser 9.41 0.61 - 1.24 mg/dL   Calcium 9.1 8.9 - 74.0 mg/dL   GFR, Estimated >81 >44 mL/min   Anion gap 13 5 - 15   Imaging Studies: DG Chest Port 1 View  Result Date: 04/07/2021 CLINICAL DATA:  Shortness of breath, mid chest pain, and headache for 24 hours. EXAM: PORTABLE CHEST 1 VIEW COMPARISON:  03/09/2021 FINDINGS: The heart size and mediastinal contours are within normal limits. Both lungs are clear. The visualized skeletal structures are unremarkable. IMPRESSION: No active disease. Electronically Signed   By: Burman Nieves M.D.   On: 04/07/2021 23:37    ED COURSE and MDM  Nursing notes, initial and subsequent vitals signs, including pulse oximetry,  reviewed and interpreted by myself.  Vitals:   04/08/21 0200 04/08/21 0230 04/08/21 0300 04/08/21 0330  BP: 130/76 126/74 (!) 110/56 129/77  Pulse: (!) 107 (!) 107 (!) 107 (!) 104  Resp: (!) 23 (!) 22 (!) 25 20  Temp:      TempSrc:      SpO2: 93% 94% 95% 94%  Weight:  Height:       Medications  albuterol (PROVENTIL,VENTOLIN) solution continuous neb (10 mg/hr Nebulization New Bag/Given 04/08/21 0012)  dexamethasone (DECADRON) injection 10 mg (10 mg Intravenous Given 04/07/21 2351)  ketorolac (TORADOL) 15 MG/ML injection 15 mg (15 mg Intravenous Given 04/07/21 2352)  metoCLOPramide (REGLAN) injection 10 mg (10 mg Intravenous Given 04/08/21 0015)  albuterol (VENTOLIN HFA) 108 (90 Base) MCG/ACT inhaler 2 puff (2 puffs Inhalation Given 04/08/21 0206)   2:00 AM Patient's lungs clear after hour-long continuous albuterol neb.  Patient would like to be observed for a little while prior to discharge to make sure he does not rebound.  Oxygen saturation is improved as well.  4:01 AM Patient observed for 2 hours.  He has not rebounded and is sleeping comfortably without tachypnea.  His oxygen saturation while sleeping is 94% on room air.  He has only minimal wheezing at the present time.  He requests a prescription for prednisone.  He was given a new inhaler as he states his old one was empty.  PROCEDURES  Procedures   ED DIAGNOSES     ICD-10-CM   1. Acute bronchospasm  J98.01     2. SOB (shortness of breath)  R06.02 DG Chest [facility] [place] [address]         [place], [place], [place] 04/08/21 [phone]

## 2021-04-08 LAB — RESP PANEL BY RT-PCR (FLU A&B, COVID) ARPGX2
Influenza A by PCR: NEGATIVE
Influenza B by PCR: NEGATIVE
SARS Coronavirus 2 by RT PCR: NEGATIVE

## 2021-04-08 MED ORDER — METOCLOPRAMIDE HCL 5 MG/ML IJ SOLN
10.0000 mg | Freq: Once | INTRAMUSCULAR | Status: AC
  Administered 2021-04-08: 10 mg via INTRAVENOUS
  Filled 2021-04-08: qty 2

## 2021-04-08 MED ORDER — PREDNISONE 20 MG PO TABS: ORAL_TABLET | ORAL | 0 refills | Status: DC

## 2021-04-08 MED ORDER — ALBUTEROL SULFATE (2.5 MG/3ML) 0.083% IN NEBU: 2.5000 mg | INHALATION_SOLUTION | Freq: Four times a day (QID) | RESPIRATORY_TRACT | 0 refills | Status: DC | PRN

## 2021-04-08 MED ORDER — ALBUTEROL SULFATE HFA 108 (90 BASE) MCG/ACT IN AERS
2.0000 | INHALATION_SPRAY | Freq: Once | RESPIRATORY_TRACT | Status: AC
  Administered 2021-04-08: 2 via RESPIRATORY_TRACT
  Filled 2021-04-08: qty 6.7

## 2021-04-08 NOTE — ED Notes (Signed)
Respiratory called for nebulizer treatment.

## 2021-04-22 ENCOUNTER — Other Ambulatory Visit: Payer: Self-pay

## 2021-04-22 ENCOUNTER — Observation Stay (HOSPITAL_COMMUNITY)
Admission: EM | Admit: 2021-04-22 | Discharge: 2021-04-23 | Disposition: A | Discharge status: Home / Self Care | Payer: Self-pay | Attending: Internal Medicine | Admitting: Internal Medicine

## 2021-04-22 ENCOUNTER — Emergency Department (HOSPITAL_COMMUNITY): Payer: Self-pay

## 2021-04-22 ENCOUNTER — Encounter (HOSPITAL_COMMUNITY): Payer: Self-pay

## 2021-04-22 DIAGNOSIS — Z20822 Contact with and (suspected) exposure to covid-19: Secondary | ICD-10-CM | POA: Insufficient documentation

## 2021-04-22 DIAGNOSIS — Z87891 Personal history of nicotine dependence: Secondary | ICD-10-CM | POA: Insufficient documentation

## 2021-04-22 DIAGNOSIS — J9601 Acute respiratory failure with hypoxia: Secondary | ICD-10-CM | POA: Diagnosis present

## 2021-04-22 DIAGNOSIS — J4521 Mild intermittent asthma with (acute) exacerbation: Principal | ICD-10-CM | POA: Diagnosis present

## 2021-04-22 DIAGNOSIS — J4541 Moderate persistent asthma with (acute) exacerbation: Secondary | ICD-10-CM

## 2021-04-22 DIAGNOSIS — E669 Obesity, unspecified: Secondary | ICD-10-CM | POA: Diagnosis present

## 2021-04-22 DIAGNOSIS — J208 Acute bronchitis due to other specified organisms: Secondary | ICD-10-CM | POA: Insufficient documentation

## 2021-04-22 DIAGNOSIS — Z79899 Other long term (current) drug therapy: Secondary | ICD-10-CM | POA: Insufficient documentation

## 2021-04-22 DIAGNOSIS — B9689 Other specified bacterial agents as the cause of diseases classified elsewhere: Secondary | ICD-10-CM | POA: Diagnosis present

## 2021-04-22 LAB — BASIC METABOLIC PANEL
Anion gap: 9 (ref 5–15)
BUN: 14 mg/dL (ref 6–20)
CO2: 25 mmol/L (ref 22–32)
Calcium: 8.8 mg/dL — ABNORMAL LOW (ref 8.9–10.3)
Chloride: 104 mmol/L (ref 98–111)
Creatinine, Ser: 0.96 mg/dL (ref 0.61–1.24)
GFR, Estimated: >60 mL/min (ref >60)
Glucose, Bld: 106 mg/dL — ABNORMAL HIGH (ref 70–99)
Potassium: 3.6 mmol/L (ref 3.5–5.1)
Sodium: 138 mmol/L (ref 135–145)

## 2021-04-22 LAB — RESP PANEL BY RT-PCR (FLU A&B, COVID) ARPGX2
Influenza A by PCR: NEGATIVE
Influenza B by PCR: NEGATIVE
SARS Coronavirus 2 by RT PCR: NEGATIVE

## 2021-04-22 LAB — BLOOD GAS, ARTERIAL
Acid-base deficit: 1.3 mmol/L (ref 0.0–2.0)
Bicarbonate: 23.1 mmol/L (ref 20.0–28.0)
FIO2: 21
O2 Saturation: 89.4 %
Patient temperature: 98.6
pCO2 arterial: 39.9 mmHg (ref 32.0–48.0)
pH, Arterial: 7.381 (ref 7.350–7.450)
pO2, Arterial: 63 mmHg — ABNORMAL LOW (ref 83.0–108.0)

## 2021-04-22 LAB — HIV ANTIBODY (ROUTINE TESTING W REFLEX): HIV Screen 4th Generation wRfx: NONREACTIVE

## 2021-04-22 LAB — CBC
HCT: 40.5 % (ref 39.0–52.0)
Hemoglobin: 13.8 g/dL (ref 13.0–17.0)
MCH: 30 pg (ref 26.0–34.0)
MCHC: 34.1 g/dL (ref 30.0–36.0)
MCV: 88 fL (ref 80.0–100.0)
Platelets: 204 10*3/uL (ref 150–400)
RBC: 4.6 MIL/uL (ref 4.22–5.81)
RDW: 12.5 % (ref 11.5–15.5)
WBC: 11.4 10*3/uL — ABNORMAL HIGH (ref 4.0–10.5)
nRBC: 0 % (ref 0.0–0.2)

## 2021-04-22 LAB — BRAIN NATRIURETIC PEPTIDE: B Natriuretic Peptide: 33.1 pg/mL (ref 0.0–100.0)

## 2021-04-22 LAB — URINALYSIS, ROUTINE W REFLEX MICROSCOPIC
Bilirubin Urine: NEGATIVE
Glucose, UA: NEGATIVE mg/dL
Hgb urine dipstick: NEGATIVE
Ketones, ur: NEGATIVE mg/dL
Leukocytes,Ua: NEGATIVE
Nitrite: NEGATIVE
Protein, ur: NEGATIVE mg/dL
Specific Gravity, Urine: 1.006 (ref 1.005–1.030)
pH: 7 (ref 5.0–8.0)

## 2021-04-22 LAB — PROCALCITONIN: Procalcitonin: <0.1 ng/mL

## 2021-04-22 MED ORDER — ALBUTEROL SULFATE (2.5 MG/3ML) 0.083% IN NEBU
INHALATION_SOLUTION | RESPIRATORY_TRACT | Status: AC
  Administered 2021-04-22: 5 mg
  Filled 2021-04-22: qty 6

## 2021-04-22 MED ORDER — SODIUM CHLORIDE 0.9 % IV SOLN
500.0000 mg | INTRAVENOUS | Status: DC
  Administered 2021-04-22: 500 mg via INTRAVENOUS
  Filled 2021-04-22: qty 500

## 2021-04-22 MED ORDER — IPRATROPIUM-ALBUTEROL 0.5-2.5 (3) MG/3ML IN SOLN
3.0000 mL | RESPIRATORY_TRACT | Status: DC | PRN
  Administered 2021-04-23: 3 mL via RESPIRATORY_TRACT
  Filled 2021-04-22: qty 3

## 2021-04-22 MED ORDER — MAGNESIUM SULFATE 2 GM/50ML IV SOLN
2.0000 g | Freq: Once | INTRAVENOUS | Status: AC
  Administered 2021-04-22: 2 g via INTRAVENOUS
  Filled 2021-04-22: qty 50

## 2021-04-22 MED ORDER — METHYLPREDNISOLONE SODIUM SUCC 125 MG IJ SOLR
125.0000 mg | Freq: Once | INTRAMUSCULAR | Status: AC
  Administered 2021-04-22: 125 mg via INTRAVENOUS
  Filled 2021-04-22: qty 2

## 2021-04-22 MED ORDER — ONDANSETRON HCL 4 MG/2ML IJ SOLN
4.0000 mg | Freq: Once | INTRAMUSCULAR | Status: AC
  Administered 2021-04-22: 4 mg via INTRAVENOUS
  Filled 2021-04-22: qty 2

## 2021-04-22 MED ORDER — ENOXAPARIN SODIUM 60 MG/0.6ML IJ SOSY
50.0000 mg | PREFILLED_SYRINGE | INTRAMUSCULAR | Status: DC
  Administered 2021-04-22 – 2021-04-23 (×2): 50 mg via SUBCUTANEOUS
  Filled 2021-04-22: qty 0.5
  Filled 2021-04-22: qty 0.6

## 2021-04-22 MED ORDER — ACETAMINOPHEN 325 MG PO TABS: 650.0000 mg | ORAL_TABLET | Freq: Four times a day (QID) | ORAL | Status: DC | PRN

## 2021-04-22 MED ORDER — IPRATROPIUM BROMIDE 0.02 % IN SOLN
1.0000 mg | Freq: Once | RESPIRATORY_TRACT | Status: AC
  Administered 2021-04-22: 1 mg via RESPIRATORY_TRACT
  Filled 2021-04-22: qty 5

## 2021-04-22 MED ORDER — IPRATROPIUM-ALBUTEROL 0.5-2.5 (3) MG/3ML IN SOLN
3.0000 mL | Freq: Four times a day (QID) | RESPIRATORY_TRACT | Status: DC
  Administered 2021-04-22 – 2021-04-23 (×6): 3 mL via RESPIRATORY_TRACT
  Filled 2021-04-22 (×6): qty 3

## 2021-04-22 MED ORDER — ALBUTEROL SULFATE HFA 108 (90 BASE) MCG/ACT IN AERS
2.0000 | INHALATION_SPRAY | RESPIRATORY_TRACT | Status: DC | PRN
  Administered 2021-04-22: 2 via RESPIRATORY_TRACT
  Filled 2021-04-22: qty 6.7

## 2021-04-22 MED ORDER — ALBUTEROL (5 MG/ML) CONTINUOUS INHALATION SOLN
10.0000 mg/h | INHALATION_SOLUTION | RESPIRATORY_TRACT | Status: DC
  Administered 2021-04-22: 10 mg/h via RESPIRATORY_TRACT
  Filled 2021-04-22: qty 20

## 2021-04-22 MED ORDER — ONDANSETRON HCL 4 MG PO TABS: 4.0000 mg | ORAL_TABLET | Freq: Four times a day (QID) | ORAL | Status: DC | PRN

## 2021-04-22 MED ORDER — LACTATED RINGERS IV SOLN: INTRAVENOUS | Status: AC

## 2021-04-22 MED ORDER — GUAIFENESIN-CODEINE 100-10 MG/5ML PO SOLN
10.0000 mL | Freq: Four times a day (QID) | ORAL | Status: DC | PRN
  Administered 2021-04-22 – 2021-04-23 (×3): 10 mL via ORAL
  Filled 2021-04-22 (×5): qty 10

## 2021-04-22 MED ORDER — POLYETHYLENE GLYCOL 3350 17 G PO PACK: 17.0000 g | PACK | Freq: Every day | ORAL | Status: DC | PRN

## 2021-04-22 MED ORDER — METHYLPREDNISOLONE SODIUM SUCC 125 MG IJ SOLR
60.0000 mg | Freq: Two times a day (BID) | INTRAMUSCULAR | Status: DC
  Administered 2021-04-22 – 2021-04-23 (×3): 60 mg via INTRAVENOUS
  Filled 2021-04-22 (×3): qty 2

## 2021-04-22 MED ORDER — ONDANSETRON HCL 4 MG/2ML IJ SOLN: 4.0000 mg | Freq: Four times a day (QID) | INTRAMUSCULAR | Status: DC | PRN

## 2021-04-22 MED ORDER — HYDROCODONE BIT-HOMATROP MBR 5-1.5 MG/5ML PO SOLN
5.0000 mL | ORAL | Status: DC | PRN
  Administered 2021-04-22: 5 mL via ORAL

## 2021-04-22 MED ORDER — ACETAMINOPHEN 650 MG RE SUPP: 650.0000 mg | Freq: Four times a day (QID) | RECTAL | Status: DC | PRN

## 2021-04-22 NOTE — ED Notes (Signed)
Pt's oxygen dropped to 78 while he was coughing. Pt placed on 2L oxygen by nasal cannula. Pt's oxygen went back to the 90s

## 2021-04-22 NOTE — ED Triage Notes (Signed)
Pt reports SHOB and wheezing. He states that he is unable to lie down and has to sit up to breathe. Pt was seen a couple of weeks ago for the same issue.

## 2021-04-22 NOTE — Progress Notes (Signed)
   Follow Up Note  HPI: 45 year old with past medical history of mild intermittent asthma and obesity presented to the emergency room on the early morning hours of 7/20 with 1 week of cough and dyspnea on exertion.  Found to be in acute asthma exacerbation.  Noted to be hypoxic.  Started on IV steroids and supplemental oxygen plus nebulizers. Pt admitted earlier this morning.  Seen after arrived to floor.  Patient feeling better.  States he is just on albuterol inhaler but has to use it very often.  In the last few weeks, complains of significant orthopnea.  Exam: CV: Regular rate and rhythm, S1-S2 Lungs: Prolonged expiratory phase with end expiratory wheeze Abd: Soft, nontender, nondistended, positive bowel sounds Ext: No clubbing or cyanosis or edema  Principal Problem:   Mild intermittent asthma with (acute) exacerbation causing acute respiratory failure with hypoxia: Continue oxygenation plus steroids and nebulizers.  Hopefully can turn this around quickly.  Normal procalcitonin so discontinue antibiotics.  Checking BNP because of description of orthopnea.  Potential discharge tomorrow if he is no longer hypoxic, breathing comfortably without significant dyspnea on exertion.  Will need outpatient referral for pulmonary Active Problems:    Obesity (BMI 30-39.9): Meets criteria with BMI >30

## 2021-04-22 NOTE — Plan of Care (Signed)
  Problem: Health Behavior/Discharge Planning: Goal: Ability to manage health-related needs will improve Outcome: Progressing   Problem: Clinical Measurements: Goal: Will remain free from infection Outcome: Progressing Goal: Diagnostic test results will improve Outcome: Progressing Goal: Respiratory complications will improve Outcome: Progressing   Problem: Activity: Goal: Risk for activity intolerance will decrease Outcome: Progressing   Problem: Elimination: Goal: Will not experience complications related to bowel motility Outcome: Progressing Goal: Will not experience complications related to urinary retention Outcome: Progressing   Problem: Safety: Goal: Ability to remain free from injury will improve Outcome: Progressing   Problem: Skin Integrity: Goal: Risk for impaired skin integrity will decrease Outcome: Progressing

## 2021-04-22 NOTE — H&P (Signed)
History and Physical    Franklyn Cafaro STM:[id number] DOB: 1976/09/20 DOA: 04/22/2021  PCP: Patient, No Pcp Per (Inactive)  Patient coming from: Home   Chief Complaint:  Chief Complaint  Patient presents with   Shortness of Breath   Asthma     HPI:    45 year old male with past medical history of mild intermittent asthma who presents to [facility] emergency department with complaints of shortness of breath and cough.  Of note, patient presented to [facility] emergency department on 7/5 with similar but less severe symptoms and was discharged home with a burst regimen of prednisone.  Patient explains that for approximately 1 week he has been experiencing progressively worsening cough.  Cough is severe in intensity, productive with tan-colored sputum.  Cough has been progressively worsening day by day and is associated with shortness of breath.  Shortness of breath initially mild intensity but progressively has become more more severe.  Shortness of breath is now worse with exertion and improved with rest.  Patient is now complaining of associated anterior chest pain that the patient describes a sore in quality and moderate to severe in intensity.  Patient symptoms continued to worsen until he eventually presented to [facility] emergency department for evaluation.  Upon evaluation in the emergency department patient was found to be wheezing and respiratory distress concerning for asthma exacerbation.  Patient was administered 125 mg of intravenous Solu-Medrol, a 1 hour continuous nebulized treatment, intravenous magnesium.  Despite these therapies patient continues to exhibit severe cough and shortness of breath in the emergency department with bouts of hypoxia requiring initiation of supplemental oxygen.  The hospitalist group is now been called to assess the patient for admission to the hospital.  Review of Systems:   Review of Systems  Respiratory:  Positive  for cough, sputum production, shortness of breath and wheezing.   Cardiovascular:  Positive for chest pain.  All other systems reviewed and are negative.  Past Medical History:  Diagnosis Date   Anaphylaxis    Asthma    Bronchitis    Low back pain     Past Surgical History:  Procedure Laterality Date   FACIAL FRACTURE SURGERY     WISDOM TOOTH EXTRACTION       reports that he has quit smoking. His smoking use included cigarettes. He smoked an average of .5 packs per day. He has never used smokeless tobacco. He reports previous alcohol use. He reports that he does not use drugs.  Allergies  Allergen Reactions   Penicillins Swelling    Did it involve swelling of the face/tongue/throat, SOB, or low BP? Yes Did it involve sudden or severe rash/hives, skin peeling, or any reaction on the inside of your mouth or nose? No Did you need to seek medical attention at a hospital or doctor's office? No When did it last happen?     childhood  If all above answers are "NO", may proceed with cephalosporin use.    Doxycycline Hives    Family History  Problem Relation Age of Onset   Hypertension Mother    Heart attack Father    Cancer Other      Prior to Admission medications   Medication Sig Start Date End Date Taking? Authorizing Provider  albuterol (PROVENTIL) (2.5 MG/3ML) 0.083% nebulizer solution Take 3 mLs (2.5 mg total) by nebulization every 6 (six) hours as needed for wheezing or shortness of breath. 04/08/21   Molpus, John, MD  EPINEPHrine 0.3 mg/0.3 mL  IJ SOAJ injection Inject 0.3 mLs (0.3 mg total) into the muscle as needed (as needed for signs of anaphylaxys, call md prior to administratin, have this available on the floor). Patient not taking: No sig reported 12/21/18   [place], Alvira Philips, DO  predniSONE (DELTASONE) 20 MG tablet Take 2 tablets every morning for 4 days starting Friday, 04/09/2021. 04/08/21   Molpus, Jonny Ruiz, MD    Physical Exam: Vitals:   04/22/21 0300 04/22/21 0400  04/22/21 0513 04/22/21 0545  BP: 129/74 123/68  115/73  Pulse: 81 96 99 85  Resp: 18 (!) 32 20 (!) 23  Temp:      TempSrc:      SpO2: 97% 94% 96% 91%  Weight:      Height:        Constitutional: Awake alert and oriented x3, patient is currently in respiratory distress. Skin: no rashes, no lesions, good skin turgor noted. Eyes: Pupils are equally reactive to light.  No evidence of scleral icterus or conjunctival pallor.  ENMT: Moist mucous membranes noted.  Posterior pharynx clear of any exudate or lesions.   Neck: normal, supple, no masses, no thyromegaly.  No evidence of jugular venous distension.   Respiratory: Markedly diminished breath sounds in all fields with intermittent mild expiratory wheezing.  No evidence of associated rales.  Increased respiratory effort with some evidence of associated accessory muscle use..  Cardiovascular: Regular rate and rhythm, no murmurs / rubs / gallops. No extremity edema. 2+ pedal pulses. No carotid bruits.  Chest:   Nontender without crepitus or deformity.   Back:   Nontender without crepitus or deformity. Abdomen: Abdomen is soft and nontender.  No evidence of intra-abdominal masses.  Positive bowel sounds noted in all quadrants.   Musculoskeletal: No joint deformity upper and lower extremities. Good ROM, no contractures. Normal muscle tone.  Neurologic: CN 2-12 grossly intact. Sensation intact.  Patient moving all 4 extremities spontaneously.  Patient is following all commands.  Patient is responsive to verbal stimuli.   Psychiatric: Patient exhibits normal mood with appropriate affect.  Patient seems to possess insight as to their current situation.     Labs on Admission: I have personally reviewed following labs and imaging studies -   CBC: Recent Labs  Lab 04/22/21 0111  WBC 11.4*  HGB 13.8  HCT 40.5  MCV 88.0  PLT 204   Basic Metabolic Panel: Recent Labs  Lab 04/22/21 0111  NA 138  K 3.6  CL 104  CO2 25  GLUCOSE 106*  BUN 14   CREATININE 0.96  CALCIUM 8.8*   GFR: Estimated Creatinine Clearance: 120.1 mL/min (by C-G formula based on SCr of 0.96 mg/dL). Liver Function Tests: No results for input(s): AST, ALT, ALKPHOS, BILITOT, PROT, ALBUMIN in the last 168 hours. No results for input(s): LIPASE, AMYLASE in the last 168 hours. No results for input(s): AMMONIA in the last 168 hours. Coagulation Profile: No results for input(s): INR, PROTIME in the last 168 hours. Cardiac Enzymes: No results for input(s): CKTOTAL, CKMB, CKMBINDEX, TROPONINI in the last 168 hours. BNP (last 3 results) No results for input(s): PROBNP in the last 8760 hours. HbA1C: No results for input(s): HGBA1C in the last 72 hours. CBG: No results for input(s): GLUCAP in the last 168 hours. Lipid Profile: No results for input(s): CHOL, HDL, LDLCALC, TRIG, CHOLHDL, LDLDIRECT in the last 72 hours. Thyroid Function Tests: No results for input(s): TSH, T4TOTAL, FREET4, T3FREE, THYROIDAB in the last 72 hours. Anemia Panel: No results for  input(s): VITAMINB12, FOLATE, FERRITIN, TIBC, IRON, RETICCTPCT in the last 72 hours. Urine analysis:    Component Value Date/Time   COLORURINE STRAW (A) 04/22/2021 0233   APPEARANCEUR CLEAR 04/22/2021 0233   LABSPEC 1.006 04/22/2021 0233   PHURINE 7.0 04/22/2021 0233   GLUCOSEU NEGATIVE 04/22/2021 0233   HGBUR NEGATIVE 04/22/2021 0233   BILIRUBINUR NEGATIVE 04/22/2021 0233   KETONESUR NEGATIVE 04/22/2021 0233   PROTEINUR NEGATIVE 04/22/2021 0233   NITRITE NEGATIVE 04/22/2021 0233   LEUKOCYTESUR NEGATIVE 04/22/2021 0233    Radiological Exams on Admission - Personally Reviewed: DG Chest 2 View  Result Date: 04/22/2021 CLINICAL DATA:  Shortness of breath EXAM: CHEST - 2 VIEW COMPARISON:  04/07/2021 FINDINGS: Lungs are clear.  No pleural effusion or pneumothorax. The heart is normal in size. Visualized osseous structures are within normal limits. IMPRESSION: Normal chest radiographs. Electronically Signed    By: Charline Bills M.D.   On: 04/22/2021 01:35    EKG: Personally reviewed.  Rhythm is normal sinus rhythm with heart rate of 77 bpm.  Evidence of slight ST segment elevations in the inferior and lateral leads with early repolarization pattern.     Assessment/Plan Principal Problem:   Mild intermittent asthma with (acute) exacerbation complicated by acute bacterial bronchitis  Patient exhibiting severe shortness of breath and and associated wheezing with ongoing severe cough thought to be secondary to asthma exacerbation I believe the patient's asthma exacerbation is complicated by an acute bacterial bronchitis considering patient's reports of copious amounts of tan-colored sputum over the past week or so Patient patient on aggressive bronchodilator therapy Treating with intravenous systemic steroids for now which will quickly be de-escalated to oral prednisone Supplemental oxygen for bouts of hypoxia the patient has been exhibiting in the emergency department Treating concurrent acute bacterial bronchitis with intravenous azithromycin which can quickly be transitioned to oral azithromycin as patient clinically improves for a total of 5 days of treatment ABG performed in the emergency department reveals no evidence of hypercapnia  Active Problems:     Acute respiratory failure with hypoxia (HCC)  Patient has been exhibiting bouts of hypoxia after the patient's ABG was performed requiring initiation of supplemental oxygen  Thought to be secondary to asthma exacerbation Treating as above Wean supplemental oxygen as able to keep oxygen saturation is 92% and higher   Code Status:  Full code Family Communication: Deferred  Status is: Observation  The patient remains OBS appropriate and will d/c before 2 midnights.  Dispo: The patient is from: Home              Anticipated d/c is to: Home              Patient currently is not medically stable to d/c.   Difficult to place patient  No        Marinda Elk MD [facility] Pager [phone]  If 7PM-7AM, please contact night-coverage [email or web address] Use universal [place] password for that web site. If you do not have the password, please call the hospital operator.  04/22/2021, 6:14 AM

## 2021-04-22 NOTE — ED Provider Notes (Signed)
[facility]-EMERGENCY DEPT Provider Note   CSN: [id number] Arrival date & time: 04/22/21  0014     History Chief Complaint  Patient presents with   Shortness of Breath   Asthma    Keith Swanson is a 45 y.o. male presents to the Emergency Department complaining of gradual, persistent, progressively worsening shortness of breath onset approx 1 week ago and worsening tonight.  Patient has associated cough with thick sputum.  Denies hemoptysis.  Pt reports a hx of severe asthma.  States this is not managed by anyone.  He reports finishing a course of oral steroids last week and within 2 days felt short of breath and wheezing again.  Denies fever, chills or sick contacts.  Pt reports last hospitalization for asthma was 1 year ago.  Laying flat and walking make his SOB worse.   Nothing makes it better at this time.     Records reviewed.  Patient was seen on 04/07/2021 for acute bronchospasm and discharged from the emergency department with steroids.   The history is provided by the patient and medical records. No language interpreter was used.      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 Authorizing Provider  albuterol (PROVENTIL) (2.5 MG/3ML) 0.083% nebulizer solution Take 3 mLs (2.5 mg total) by nebulization every 6 (six) hours as needed for wheezing or shortness of breath. 04/08/21   Molpus, John, MD  EPINEPHrine 0.3 mg/0.3 mL IJ SOAJ injection Inject 0.3 mLs (0.3 mg total) into the muscle as needed (as needed for signs of anaphylaxys, call md prior to administratin, have this available on the floor). Patient not taking: No sig reported 12/21/18   [place], Alvira Philips, DO  predniSONE (DELTASONE) 20 MG tablet Take 2 tablets every morning for 4 days starting Friday, 04/09/2021. 04/08/21   Molpus, John, MD    Allergies    Penicillins and Doxycycline  Review of Systems   Review of Systems  Constitutional:  Positive for fatigue. Negative for appetite change, diaphoresis, fever and unexpected weight change.  HENT:  Negative for mouth sores.   Eyes:  Negative for visual disturbance.  Respiratory:  Positive for cough, chest tightness and shortness of breath. Negative for wheezing.   Cardiovascular:  Negative for chest pain.  Gastrointestinal:  Positive for abdominal pain (with coughing). Negative for constipation, diarrhea, nausea and vomiting.  Endocrine: Negative for polydipsia, polyphagia and polyuria.  Genitourinary:  Negative for dysuria,  frequency, hematuria and urgency.  Musculoskeletal:  Negative for back pain and neck stiffness.  Skin:  Negative for rash.  Allergic/Immunologic: Negative for immunocompromised state.  Neurological:  Negative for syncope, light-headedness and headaches.  Hematological:  Does not bruise/bleed easily.  Psychiatric/Behavioral:  Negative for sleep disturbance. The patient is not nervous/anxious.    Physical Exam Updated Vital Signs BP (!) 125/98   Pulse 85   Temp 99 F (37.2 C) (Oral)   Resp (!) 31   Ht 6' (1.829 m)   Wt 104.3 kg   SpO2 91%   BMI 31.19  kg/m   Physical Exam Vitals and nursing note reviewed.  Constitutional:      General: He is in acute distress.     Appearance: He is not diaphoretic.  HENT:     Head: Normocephalic.  Eyes:     General: No scleral icterus.    Conjunctiva/sclera: Conjunctivae normal.  Cardiovascular:     Rate and Rhythm: Normal rate and regular rhythm.     Pulses: Normal pulses.          Radial pulses are 2+ on the right side and 2+ on the left side.  Pulmonary:     Effort: Tachypnea, accessory muscle usage and respiratory distress present. No prolonged expiration or retractions.     Breath sounds: No stridor. Decreased breath sounds and wheezing (throughout) present.     Comments: Equal chest rise. No increased work of breathing. Abdominal:     General: There is no distension.     Palpations: Abdomen is soft.     Tenderness: There is no abdominal tenderness. There is no guarding or rebound.  Musculoskeletal:        General: Normal range of motion.     Cervical back: Normal range of motion.     Right lower leg: No tenderness. No edema.     Left lower leg: No tenderness. No edema.     Comments: Moves all extremities equally and without difficulty.  Skin:    General: Skin is warm and dry.     Capillary Refill: Capillary refill takes less than 2 seconds.  Neurological:     Mental Status: He is alert.     GCS: GCS eye subscore is 4. GCS verbal subscore is 5. GCS motor subscore is 6.     Comments: Speech is clear and goal oriented.  Psychiatric:        Mood and Affect: Mood normal.    ED Results / Procedures / Treatments   Labs (all labs ordered are listed, but only abnormal results are displayed) Labs Reviewed  CBC - Abnormal; Notable for the following components:      Result Value   WBC 11.4 (*)    All other components within normal limits  BASIC METABOLIC PANEL - Abnormal; Notable for the following components:   Glucose, Bld 106 (*)    Calcium 8.8 (*)    All other components within  normal limits  URINALYSIS, ROUTINE W REFLEX MICROSCOPIC - Abnormal; Notable for the following components:   Color, Urine STRAW (*)    All other components within normal limits  RESP PANEL BY RT-PCR (FLU A&B, COVID) ARPGX2  BLOOD GAS, ARTERIAL    EKG EKG Interpretation  Date/Time:  Wednesday April 22 2021 01:05:25 EDT Ventricular Rate:  77 PR Interval:  176 QRS Duration: 77 QT Interval:  343 QTC Calculation: 389 R Axis:   66 Text Interpretation: Sinus rhythm ST elev, probable normal early repol pattern  When compared with ECG of 04/07/2021, No significant change was found Confirmed by Dione Booze ([id number]) on 04/22/2021 1:10:48 AM  Radiology DG Chest 2 View  Result Date: 04/22/2021 CLINICAL DATA:  Shortness of breath EXAM: CHEST - 2 VIEW COMPARISON:  04/07/2021 FINDINGS: Lungs are clear.  No pleural effusion or pneumothorax. The heart is normal in size. Visualized osseous structures are within normal limits. IMPRESSION: Normal chest radiographs. Electronically Signed   By: Charline Bills M.D.   On: 04/22/2021 01:35    Procedures Procedures   Medications Ordered in ED Medications  albuterol (VENTOLIN HFA) 108 (90 Base) MCG/ACT inhaler 2 puff (has no administration in time range)  albuterol (PROVENTIL,VENTOLIN) solution continuous neb (0 mg/hr Nebulization Stopped 04/22/21 0354)  HYDROcodone bit-homatropine (HYCODAN) 5-1.5 MG/5ML syrup 5 mL (5 mLs Oral Given 04/22/21 0430)  albuterol (PROVENTIL) (2.5 MG/3ML) 0.083% nebulizer solution (5 mg  Given 04/22/21 0057)  ipratropium (ATROVENT) nebulizer solution 1 mg (1 mg Nebulization Given 04/22/21 0126)  methylPREDNISolone sodium succinate (SOLU-MEDROL) 125 mg/2 mL injection 125 mg (125 mg Intravenous Given 04/22/21 0134)  magnesium sulfate IVPB 2 g 50 mL (0 g Intravenous Stopped 04/22/21 0236)  ondansetron (ZOFRAN) injection 4 mg (4 mg Intravenous Given 04/22/21 0134)    ED Course  I have reviewed the triage vital signs and the nursing  notes.  Pertinent labs & imaging results that were available during my care of the patient were reviewed by me and considered in my medical decision making (see chart for details).    MDM Rules/Calculators/A&P                           Patient presents the emergency department with shortness of breath.  Severe respiratory distress on my initial evaluation with tachypnea, sensory muscle usage, significantly diminished breath sounds and inspiratory and expiratory wheezing.  Patient speaking in 1-2 word sentences.  Asthma protocol initiated.  3:00 AM Patient has improved significantly.  Mild expiratory wheezing noted but respiratory distress has subsided.  Patient without hypoxia at this time. Continuing to receive continuous nebulizer.   4:50 AM Patient with significantly improved breath sounds.  Continues to have significant and dry cough.  Expiratory wheezes remain audible.  Patient remains tachypneic.  Will need admission for asthma exacerbation.  Discussed with Dr. Martyn Malay who will admit.  He does request ABG to assess for pH.  This is pending.  Final Clinical Impression(s) / ED Diagnoses Final diagnoses:  Moderate persistent asthma with exacerbation    Rx / DC Orders ED Discharge Orders     None        Robynn Marcel, Boyd Kerbs 04/22/21 0451    Dione Booze, MD 04/22/21 [phone]

## 2021-04-23 DIAGNOSIS — J4541 Moderate persistent asthma with (acute) exacerbation: Secondary | ICD-10-CM

## 2021-04-23 DIAGNOSIS — E669 Obesity, unspecified: Secondary | ICD-10-CM

## 2021-04-23 DIAGNOSIS — K219 Gastro-esophageal reflux disease without esophagitis: Secondary | ICD-10-CM

## 2021-04-23 LAB — CBC WITH DIFFERENTIAL/PLATELET
Abs Immature Granulocytes: 0.13 10*3/uL — ABNORMAL HIGH (ref 0.00–0.07)
Basophils Absolute: 0 10*3/uL (ref 0.0–0.1)
Basophils Relative: 0 %
Eosinophils Absolute: 0 10*3/uL (ref 0.0–0.5)
Eosinophils Relative: 0 %
HCT: 43.7 % (ref 39.0–52.0)
Hemoglobin: 14.4 g/dL (ref 13.0–17.0)
Immature Granulocytes: 1 %
Lymphocytes Relative: 5 %
Lymphs Abs: 1.2 10*3/uL (ref 0.7–4.0)
MCH: 29.7 pg (ref 26.0–34.0)
MCHC: 33 g/dL (ref 30.0–36.0)
MCV: 90.1 fL (ref 80.0–100.0)
Monocytes Absolute: 0.7 10*3/uL (ref 0.1–1.0)
Monocytes Relative: 3 %
Neutro Abs: 19.4 10*3/uL — ABNORMAL HIGH (ref 1.7–7.7)
Neutrophils Relative %: 91 %
Platelets: 214 10*3/uL (ref 150–400)
RBC: 4.85 MIL/uL (ref 4.22–5.81)
RDW: 12.8 % (ref 11.5–15.5)
WBC: 21.4 10*3/uL — ABNORMAL HIGH (ref 4.0–10.5)
nRBC: 0 % (ref 0.0–0.2)

## 2021-04-23 LAB — HEMOGLOBIN A1C
Hgb A1c MFr Bld: 5 % (ref 4.8–5.6)
Mean Plasma Glucose: 96.8 mg/dL

## 2021-04-23 LAB — COMPREHENSIVE METABOLIC PANEL
ALT: 28 U/L (ref 0–44)
AST: 27 U/L (ref 15–41)
Albumin: 4.1 g/dL (ref 3.5–5.0)
Alkaline Phosphatase: 71 U/L (ref 38–126)
Anion gap: 11 (ref 5–15)
BUN: 14 mg/dL (ref 6–20)
CO2: 24 mmol/L (ref 22–32)
Calcium: 9.8 mg/dL (ref 8.9–10.3)
Chloride: 103 mmol/L (ref 98–111)
Creatinine, Ser: 0.95 mg/dL (ref 0.61–1.24)
GFR, Estimated: >60 mL/min (ref >60)
Glucose, Bld: 227 mg/dL — ABNORMAL HIGH (ref 70–99)
Potassium: 4.9 mmol/L (ref 3.5–5.1)
Sodium: 138 mmol/L (ref 135–145)
Total Bilirubin: 0.5 mg/dL (ref 0.3–1.2)
Total Protein: 7.4 g/dL (ref 6.5–8.1)

## 2021-04-23 LAB — MAGNESIUM: Magnesium: 2.3 mg/dL (ref 1.7–2.4)

## 2021-04-23 MED ORDER — GUAIFENESIN-DM 100-10 MG/5ML PO SYRP: 5.0000 mL | ORAL_SOLUTION | ORAL | 0 refills | Status: DC | PRN

## 2021-04-23 MED ORDER — PREDNISONE 10 MG PO TABS: ORAL_TABLET | ORAL | 0 refills | Status: AC

## 2021-04-23 MED ORDER — OMEPRAZOLE 20 MG PO CPDR: 20.0000 mg | DELAYED_RELEASE_CAPSULE | Freq: Every day | ORAL | 1 refills | Status: DC

## 2021-04-23 NOTE — Discharge Summary (Signed)
Discharge Summary  Keith Swanson YQM:[id number] DOB: [birth year]  PCP: Patient, No Pcp Per (Inactive)  Admit date: 04/22/2021 Discharge date: 04/23/2021  Time spent: 25 minutes  Recommendations for Outpatient Follow-up:  New medication: Prednisone taper New medication: Prilosec 20 mg p.o. daily Patient [facility] for establishing new PCP Patient referred to pulmonary  Discharge Diagnoses:  Active Hospital Problems   Diagnosis Date Noted   Mild intermittent asthma with (acute) exacerbation 04/22/2021   Acute bacterial bronchitis 04/22/2021   Acute respiratory failure with hypoxia (HCC) 04/22/2021   Obesity (BMI 30-39.9) 04/22/2021    Resolved Hospital Problems  No resolved problems to display.    Discharge Condition: Improved, being discharged home  Diet recommendation: Low-fat diet, given information on foods to avoid with acid reflux  Vitals:   04/23/21 0747 04/23/21 1422  BP:    Pulse:    Resp:    Temp:    SpO2: 98% 97%    History of present illness:  45 year old with past medical history of mild intermittent asthma and obesity presented to the emergency room on the early morning hours of 7/20 with 1 week of cough and dyspnea on exertion.  Found to be in acute asthma exacerbation.  Noted to be hypoxic.  Started on IV steroids and supplemental oxygen plus nebulizers.  Hospital Course:  Principal Problem:   Mild intermittent asthma with (acute) exacerbation causing acute respiratory failure with hypoxia: Patient treated with oxygen plus steroids and nebulizers.  Over the next few days, able to be weaned off of oxygen altogether.  Breathing more comfortably.  Able to ambulate and keep oxygen levels above 90% consistently.  Normal procalcitonin level so antibiotics discontinued after initial doses.  BNP normal.  I suspect some of his issues may be related to acid reflux as he tells me he often lays down soon after he eats and at night sometimes  is when he wakes up with coughing or wheezing but better if he stays upright.  He thought it was from fluid on his lungs, however a negative BNP and chest x-ray noting no fluid precluded this.  Have given him information on GERD and started him on a PPI upon discharge.  We will discharge him on prednisone taper.  Have referred him to pulmonary. Active Problems:    Obesity (BMI 30-39.9): Patient is criteria BMI greater than 30   Procedures: None  Consultations: None  Discharge Exam: BP 129/80 (BP Location: Left Arm)   Pulse 76   Temp 98.5 F (36.9 C)   Resp 15   Ht 6' (1.829 m)   Wt 104.3 kg   SpO2 97%   BMI 31.19 kg/m   General: Alert and oriented x3, no acute distress Cardiovascular: Regular rate and rhythm, S1-S2 Respiratory: Clear to auscultation bilaterally  Discharge Instructions You were cared for by a hospitalist during your hospital stay. If you have any questions about your discharge medications or the care you received while you were in the hospital after you are discharged, you can call the unit and asked to speak with the hospitalist on call if the hospitalist that took care of you is not available. Once you are discharged, your primary care physician will handle any further medical issues. Please note that NO REFILLS for any discharge medications will be authorized once you are discharged, as it is imperative that you return to your primary care physician (or establish a relationship with a primary care physician if you do not have one)  for your aftercare needs so that they can reassess your need for medications and monitor your lab values.  Discharge Instructions     Diet - low sodium heart healthy   Complete by: As directed    Increase activity slowly   Complete by: As directed       Allergies as of 04/23/2021       Reactions   Penicillins Swelling   Did it involve swelling of the face/tongue/throat, SOB, or low BP? Yes Did it involve sudden or severe  rash/hives, skin peeling, or any reaction on the inside of your mouth or nose? No Did you need to seek medical attention at a hospital or doctor's office? No When did it last happen?     childhood  If all above answers are "NO", may proceed with cephalosporin use.   Doxycycline Hives        Medication List     TAKE these medications    albuterol 108 (90 Base) MCG/ACT inhaler Commonly known as: VENTOLIN HFA Inhale 2 puffs into the lungs every 4 (four) hours as needed for wheezing or shortness of breath.   albuterol (2.5 MG/3ML) 0.083% nebulizer solution Commonly known as: PROVENTIL Take 3 mLs (2.5 mg total) by nebulization every 6 (six) hours as needed for wheezing or shortness of breath.   guaiFENesin 600 MG 12 hr tablet Commonly known as: MUCINEX Take 600 mg by mouth 2 (two) times daily as needed for cough.   guaiFENesin-dextromethorphan 100-10 MG/5ML syrup Commonly known as: ROBITUSSIN DM Take 5 mLs by mouth every 4 (four) hours as needed for cough.   multivitamin with minerals Tabs tablet Take 1 tablet by mouth daily. [employer] 50+ multivitamin   omeprazole 20 MG capsule Commonly known as: PRILOSEC Take 1 capsule (20 mg total) by mouth daily.   predniSONE 10 MG tablet Commonly known as: DELTASONE Take 5 tablets (50 mg total) by mouth daily for 1 day, THEN 4 tablets (40 mg total) daily for 1 day, THEN 3 tablets (30 mg total) daily for 1 day, THEN 2 tablets (20 mg total) daily for 1 day, THEN 1 tablet (10 mg total) daily for 1 day. Start taking on: April 23, 2021 What changed:  medication strength See the new instructions.       Allergies  Allergen Reactions   Penicillins Swelling    Did it involve swelling of the face/tongue/throat, SOB, or low BP? Yes Did it involve sudden or severe rash/hives, skin peeling, or any reaction on the inside of your mouth or nose? No Did you need to seek medical attention at a hospital or doctor's office? No When did it last  happen?     childhood  If all above answers are "NO", may proceed with cephalosporin use.    Doxycycline Hives    Follow-up Information     [facility] Follow up on 07/09/2021.   Specialty: Internal Medicine Why: Thursday at 2:00 for your hospital follow up appointment. Contact information: [address] [place] [id number] [phone]        [facility]. Schedule an appointment as soon as possible for a visit in 2 week(s).   Specialty: Pulmonology Contact information: [address] [place] [phone]                 The results of significant diagnostics from this hospitalization (including imaging, microbiology, ancillary and laboratory) are listed below for reference.  Significant Diagnostic Studies: DG Chest 2 View  Result Date: 04/22/2021 CLINICAL DATA:  Shortness of breath EXAM: CHEST - 2 VIEW COMPARISON:  04/07/2021 FINDINGS: Lungs are clear.  No pleural effusion or pneumothorax. The heart is normal in size. Visualized osseous structures are within normal limits. IMPRESSION: Normal chest radiographs. Electronically Signed   By: Charline Bills M.D.   On: 04/22/2021 01:35   DG Chest Port 1 View  Result Date: 04/07/2021 CLINICAL DATA:  Shortness of breath, mid chest pain, and headache for 24 hours. EXAM: PORTABLE CHEST 1 VIEW COMPARISON:  03/09/2021 FINDINGS: The heart size and mediastinal contours are within normal limits. Both lungs are clear. The visualized skeletal structures are unremarkable. IMPRESSION: No active disease. Electronically Signed   By: Burman Nieves M.D.   On: 04/07/2021 23:37    Microbiology: Recent Results (from the past 240 hour(s))  Resp Panel by RT-PCR (Flu A&B, Covid) Nasopharyngeal Swab     Status: None   Collection Time: 04/22/21  1:12 AM   Specimen: Nasopharyngeal Swab; Nasopharyngeal(NP) swabs in vial transport medium  Result Value Ref  Range Status   SARS Coronavirus 2 by RT PCR NEGATIVE NEGATIVE Final    Comment: (NOTE) SARS-CoV-2 target nucleic acids are NOT DETECTED.  The SARS-CoV-2 RNA is generally detectable in upper respiratory specimens during the acute phase of infection. The lowest concentration of SARS-CoV-2 viral copies this assay can detect is 138 copies/mL. A negative result does not preclude SARS-Cov-2 infection and should not be used as the sole basis for treatment or other patient management decisions. A negative result may occur with  improper specimen collection/handling, submission of specimen other than nasopharyngeal swab, presence of viral mutation(s) within the areas targeted by this assay, and inadequate number of viral copies(<138 copies/mL). A negative result must be combined with clinical observations, patient history, and epidemiological information. The expected result is Negative.  Fact Sheet for Patients:  BloggerCourse.com  Fact Sheet for Healthcare Providers:  SeriousBroker.it  This test is no t yet approved or cleared by the [place] FDA and  has been authorized for detection and/or diagnosis of SARS-CoV-2 by FDA under an Emergency Use Authorization (EUA). This EUA will remain  in effect (meaning this test can be used) for the duration of the COVID-19 declaration under Section 564(b)(1) of the Act, 21 U.S.C.section 360bbb-3(b)(1), unless the authorization is terminated  or revoked sooner.       Influenza A by PCR NEGATIVE NEGATIVE Final   Influenza B by PCR NEGATIVE NEGATIVE Final    Comment: (NOTE) The Xpert Xpress SARS-CoV-2/FLU/RSV plus assay is intended as an aid in the diagnosis of influenza from Nasopharyngeal swab specimens and should not be used as a sole basis for treatment. Nasal washings and aspirates are unacceptable for Xpert Xpress SARS-CoV-2/FLU/RSV testing.  Fact Sheet for  Patients: BloggerCourse.com  Fact Sheet for Healthcare Providers: SeriousBroker.it  This test is not yet approved or cleared by the [place] FDA and has been authorized for detection and/or diagnosis of SARS-CoV-2 by FDA under an Emergency Use Authorization (EUA). This EUA will remain in effect (meaning this test can be used) for the duration of the COVID-19 declaration under Section 564(b)(1) of the Act, 21 U.S.C. section 360bbb-3(b)(1), unless the authorization is terminated or revoked.  Performed at [facility], [address] [place], [place] [address]      Labs: Basic Metabolic Panel: Recent Labs  Lab 04/22/21 0111 04/23/21 0541  NA 138 138  K 3.6 4.9  CL 104 103  CO2 25 24  GLUCOSE 106* 227*  BUN 14 14  CREATININE 0.96 0.95  CALCIUM 8.8* 9.8  MG  --  2.3   Liver Function Tests: Recent Labs  Lab 04/23/21 0541  AST 27  ALT 28  ALKPHOS 71  BILITOT 0.5  PROT 7.4  ALBUMIN 4.1   No results for input(s): LIPASE, AMYLASE in the last 168 hours. No results for input(s): AMMONIA in the last 168 hours. CBC: Recent Labs  Lab 04/22/21 0111 04/23/21 0541  WBC 11.4* 21.4*  NEUTROABS  --  19.4*  HGB 13.8 14.4  HCT 40.5 43.7  MCV 88.0 90.1  PLT 204 214   Cardiac Enzymes: No results for input(s): CKTOTAL, CKMB, CKMBINDEX, TROPONINI in the last 168 hours. BNP: BNP (last 3 results) Recent Labs    04/22/21 1622  BNP 33.1    ProBNP (last 3 results) No results for input(s): PROBNP in the last 8760 hours.  CBG: No results for input(s): GLUCAP in the last 168 hours.     Signed:  Hollice Espy, MD [facility] 04/23/2021, 4:57 PM

## 2021-04-23 NOTE — TOC Initial Note (Signed)
Transition of Care [facility]) - Initial/Assessment Note    Patient Details  Name: Keith Swanson MRN: [id number] Date of Birth: November 10, 1975  [facility] CM/SW Contact:    Ida Rogue, LCSW Phone Number: 04/23/2021, 1:55 PM  Clinical Narrative:    Made hospital follow up appointment at [facility] for Mr Due who does not have a PCP. TOC will continue to follow during the course of hospitalization.                Expected Discharge Plan: Home/Self Care Barriers to Discharge: No Barriers Identified   Patient Goals and CMS Choice        Expected Discharge Plan and Services Expected Discharge Plan: Home/Self Care                                              Prior Living Arrangements/Services                       Activities of Daily Living Home Assistive Devices/Equipment: Nebulizer ADL Screening (condition at time of admission) Patient's cognitive ability adequate to safely complete daily activities?: No Is the patient deaf or have difficulty hearing?: No Does the patient have difficulty seeing, even when wearing glasses/contacts?: No Does the patient have difficulty concentrating, remembering, or making decisions?: No Patient able to express need for assistance with ADLs?: No Does the patient have difficulty dressing or bathing?: No Independently performs ADLs?: Yes (appropriate for developmental age) Does the patient have difficulty walking or climbing stairs?: No Weakness of Legs: None Weakness of Arms/Hands: None  Permission Sought/Granted                  Emotional Assessment              Admission diagnosis:  Moderate persistent asthma with exacerbation [J45.41] Mild intermittent asthma with (acute) exacerbation [J45.21] Patient Active Problem List   Diagnosis Date Noted   Mild intermittent asthma with (acute) exacerbation 04/22/2021   Acute bacterial bronchitis 04/22/2021   Acute respiratory failure with  hypoxia (HCC) 04/22/2021   Obesity (BMI 30-39.9) 04/22/2021   Dental infection    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 [place] [address] Phone: [phone] Fax: [phone]  [id number] - [place], [place] - [address] [facility] [address] [place] [address] Phone: [phone] Fax: [phone]  [facility] [address], [place] - [address] [place] [address] Phone: [phone] Fax: [phone]     Social Determinants of Health (SDOH) Interventions    Readmission Risk Interventions No flowsheet data found.

## 2021-06-17 ENCOUNTER — Ambulatory Visit: Payer: Self-pay | Admitting: Nurse Practitioner

## 2021-07-09 ENCOUNTER — Ambulatory Visit: Payer: Self-pay | Admitting: Nurse Practitioner

## 2023-02-14 DIAGNOSIS — F1011 Alcohol abuse, in remission: Secondary | ICD-10-CM | POA: Diagnosis present

## 2023-02-14 DIAGNOSIS — F419 Anxiety disorder, unspecified: Secondary | ICD-10-CM | POA: Insufficient documentation

## 2023-06-11 ENCOUNTER — Encounter (HOSPITAL_COMMUNITY): Payer: Self-pay | Admitting: Emergency Medicine

## 2023-06-11 ENCOUNTER — Emergency Department (HOSPITAL_COMMUNITY): Payer: Medicaid Other

## 2023-06-11 ENCOUNTER — Inpatient Hospital Stay (HOSPITAL_COMMUNITY)
Admission: EM | Admit: 2023-06-11 | Discharge: 2023-06-18 | DRG: 501 | Disposition: A | Discharge status: Home / Self Care | Payer: Medicaid Other | Attending: Internal Medicine | Admitting: Internal Medicine

## 2023-06-11 ENCOUNTER — Emergency Department (HOSPITAL_COMMUNITY)
Admit: 2023-06-11 | Discharge: 2023-06-11 | Disposition: A | Discharge status: Home / Self Care | Payer: Medicaid Other | Attending: Emergency Medicine | Admitting: Emergency Medicine

## 2023-06-11 ENCOUNTER — Other Ambulatory Visit: Payer: Self-pay

## 2023-06-11 DIAGNOSIS — E8809 Other disorders of plasma-protein metabolism, not elsewhere classified: Secondary | ICD-10-CM | POA: Diagnosis present

## 2023-06-11 DIAGNOSIS — Z880 Allergy status to penicillin: Secondary | ICD-10-CM | POA: Diagnosis not present

## 2023-06-11 DIAGNOSIS — M7022 Olecranon bursitis, left elbow: Secondary | ICD-10-CM | POA: Diagnosis not present

## 2023-06-11 DIAGNOSIS — M0080 Arthritis due to other bacteria, unspecified joint: Secondary | ICD-10-CM | POA: Diagnosis not present

## 2023-06-11 DIAGNOSIS — F1011 Alcohol abuse, in remission: Secondary | ICD-10-CM | POA: Diagnosis present

## 2023-06-11 DIAGNOSIS — Z8042 Family history of malignant neoplasm of prostate: Secondary | ICD-10-CM

## 2023-06-11 DIAGNOSIS — L03114 Cellulitis of left upper limb: Secondary | ICD-10-CM | POA: Diagnosis not present

## 2023-06-11 DIAGNOSIS — Z8619 Personal history of other infectious and parasitic diseases: Secondary | ICD-10-CM

## 2023-06-11 DIAGNOSIS — Z8249 Family history of ischemic heart disease and other diseases of the circulatory system: Secondary | ICD-10-CM | POA: Diagnosis not present

## 2023-06-11 DIAGNOSIS — K219 Gastro-esophageal reflux disease without esophagitis: Secondary | ICD-10-CM | POA: Diagnosis present

## 2023-06-11 DIAGNOSIS — F1021 Alcohol dependence, in remission: Secondary | ICD-10-CM | POA: Diagnosis present

## 2023-06-11 DIAGNOSIS — M009 Pyogenic arthritis, unspecified: Secondary | ICD-10-CM

## 2023-06-11 DIAGNOSIS — M00022 Staphylococcal arthritis, left elbow: Secondary | ICD-10-CM | POA: Diagnosis not present

## 2023-06-11 DIAGNOSIS — Z720 Tobacco use: Secondary | ICD-10-CM | POA: Diagnosis not present

## 2023-06-11 DIAGNOSIS — R7401 Elevation of levels of liver transaminase levels: Secondary | ICD-10-CM | POA: Diagnosis present

## 2023-06-11 DIAGNOSIS — Z79899 Other long term (current) drug therapy: Secondary | ICD-10-CM

## 2023-06-11 DIAGNOSIS — M79602 Pain in left arm: Secondary | ICD-10-CM | POA: Diagnosis not present

## 2023-06-11 DIAGNOSIS — Z87892 Personal history of anaphylaxis: Secondary | ICD-10-CM

## 2023-06-11 DIAGNOSIS — J4521 Mild intermittent asthma with (acute) exacerbation: Secondary | ICD-10-CM | POA: Diagnosis not present

## 2023-06-11 DIAGNOSIS — Z881 Allergy status to other antibiotic agents status: Secondary | ICD-10-CM

## 2023-06-11 DIAGNOSIS — F17210 Nicotine dependence, cigarettes, uncomplicated: Secondary | ICD-10-CM | POA: Diagnosis present

## 2023-06-11 DIAGNOSIS — M71122 Other infective bursitis, left elbow: Secondary | ICD-10-CM | POA: Diagnosis present

## 2023-06-11 LAB — COMPREHENSIVE METABOLIC PANEL
ALT: 57 U/L — ABNORMAL HIGH (ref 0–44)
AST: 42 U/L — ABNORMAL HIGH (ref 15–41)
Albumin: 3.4 g/dL — ABNORMAL LOW (ref 3.5–5.0)
Alkaline Phosphatase: 54 U/L (ref 38–126)
Anion gap: 10 (ref 5–15)
BUN: 9 mg/dL (ref 6–20)
CO2: 25 mmol/L (ref 22–32)
Calcium: 8.5 mg/dL — ABNORMAL LOW (ref 8.9–10.3)
Chloride: 103 mmol/L (ref 98–111)
Creatinine, Ser: 0.72 mg/dL (ref 0.61–1.24)
GFR, Estimated: >60 mL/min (ref >60)
Glucose, Bld: 98 mg/dL (ref 70–99)
Potassium: 3.5 mmol/L (ref 3.5–5.1)
Sodium: 138 mmol/L (ref 135–145)
Total Bilirubin: 0.8 mg/dL (ref 0.3–1.2)
Total Protein: 6.2 g/dL — ABNORMAL LOW (ref 6.5–8.1)

## 2023-06-11 LAB — CBC WITH DIFFERENTIAL/PLATELET
Abs Immature Granulocytes: 0.16 10*3/uL — ABNORMAL HIGH (ref 0.00–0.07)
Basophils Absolute: 0 10*3/uL (ref 0.0–0.1)
Basophils Relative: 0 %
Eosinophils Absolute: 0.3 10*3/uL (ref 0.0–0.5)
Eosinophils Relative: 3 %
HCT: 41.5 % (ref 39.0–52.0)
Hemoglobin: 13.9 g/dL (ref 13.0–17.0)
Immature Granulocytes: 2 %
Lymphocytes Relative: 22 %
Lymphs Abs: 2.3 10*3/uL (ref 0.7–4.0)
MCH: 29.1 pg (ref 26.0–34.0)
MCHC: 33.5 g/dL (ref 30.0–36.0)
MCV: 87 fL (ref 80.0–100.0)
Monocytes Absolute: 1 10*3/uL (ref 0.1–1.0)
Monocytes Relative: 9 %
Neutro Abs: 6.8 10*3/uL (ref 1.7–7.7)
Neutrophils Relative %: 64 %
Platelets: 194 10*3/uL (ref 150–400)
RBC: 4.77 MIL/uL (ref 4.22–5.81)
RDW: 12.9 % (ref 11.5–15.5)
WBC: 10.6 10*3/uL — ABNORMAL HIGH (ref 4.0–10.5)
nRBC: 0 % (ref 0.0–0.2)

## 2023-06-11 MED ORDER — ACETAMINOPHEN 325 MG PO TABS: 650.0000 mg | ORAL_TABLET | Freq: Four times a day (QID) | ORAL | Status: DC | PRN

## 2023-06-11 MED ORDER — ONDANSETRON HCL 4 MG/2ML IJ SOLN: 4.0000 mg | Freq: Four times a day (QID) | INTRAMUSCULAR | Status: DC | PRN

## 2023-06-11 MED ORDER — ACETAMINOPHEN 650 MG RE SUPP: 650.0000 mg | Freq: Four times a day (QID) | RECTAL | Status: DC | PRN

## 2023-06-11 MED ORDER — KETOROLAC TROMETHAMINE 30 MG/ML IJ SOLN
30.0000 mg | Freq: Four times a day (QID) | INTRAMUSCULAR | Status: AC | PRN
  Administered 2023-06-11 – 2023-06-12 (×3): 30 mg via INTRAVENOUS
  Filled 2023-06-11 (×3): qty 1

## 2023-06-11 MED ORDER — SODIUM CHLORIDE 0.9 % IV SOLN
2.0000 g | INTRAVENOUS | Status: AC
  Administered 2023-06-12 – 2023-06-18 (×7): 2 g via INTRAVENOUS
  Filled 2023-06-11 (×7): qty 20

## 2023-06-11 MED ORDER — ENOXAPARIN SODIUM 40 MG/0.4ML IJ SOSY
40.0000 mg | PREFILLED_SYRINGE | INTRAMUSCULAR | Status: DC
  Administered 2023-06-11: 40 mg via SUBCUTANEOUS
  Filled 2023-06-11: qty 0.4

## 2023-06-11 MED ORDER — SODIUM CHLORIDE 0.9 % IV SOLN
2.0000 g | Freq: Once | INTRAVENOUS | Status: AC
  Administered 2023-06-11: 2 g via INTRAVENOUS
  Filled 2023-06-11: qty 20

## 2023-06-11 MED ORDER — OXYCODONE HCL 5 MG PO TABS
5.0000 mg | ORAL_TABLET | Freq: Four times a day (QID) | ORAL | Status: DC | PRN
  Administered 2023-06-11 – 2023-06-12 (×3): 5 mg via ORAL
  Filled 2023-06-11 (×4): qty 1

## 2023-06-11 MED ORDER — ONDANSETRON HCL 4 MG PO TABS: 4.0000 mg | ORAL_TABLET | Freq: Four times a day (QID) | ORAL | Status: DC | PRN

## 2023-06-11 MED ORDER — PANTOPRAZOLE SODIUM 40 MG PO TBEC
40.0000 mg | DELAYED_RELEASE_TABLET | Freq: Once | ORAL | Status: AC
  Administered 2023-06-11: 40 mg via ORAL
  Filled 2023-06-11: qty 1

## 2023-06-11 NOTE — ED Provider Notes (Signed)
[place] EMERGENCY DEPARTMENT AT [facility] Provider Note   CSN: [id number] Arrival date & time: 06/11/23  1349     History  Chief Complaint  Patient presents with   Elbow Pain    Keith Swanson is a 47 y.o. male.  Patient reports that he donated plasma 2 days ago.  Patient reports that they started an IV on his left arm.  Patient reports that they were unable to use that arm and ended up placing an IV in his right arm.  Patient reports left arm is now swollen and painful.  Patient has a history of elbow fracture.  Patient reports pain with moving his elbow pain with moving his hand patient reports he has numbness and tingling in his hand.  Patient states he cannot put a shirt on because he cannot tolerate moving.  Patient denies any fever he denies any chills.  Patient has a past medical history of pericarditis.  He denies any IV drug use.  The history is provided by the patient. No language interpreter was used.       Home Medications Prior to Admission medications   Medication Sig Start Date End Date Taking? Authorizing Provider  albuterol (PROVENTIL) (2.5 MG/3ML) 0.083% nebulizer solution Take 3 mLs (2.5 mg total) by nebulization every 6 (six) hours as needed for wheezing or shortness of breath. 04/08/21   Molpus, John, MD  albuterol (VENTOLIN HFA) 108 (90 Base) MCG/ACT inhaler Inhale 2 puffs into the lungs every 4 (four) hours as needed for wheezing or shortness of breath. 09/30/20 09/30/21  [provider]  guaiFENesin (MUCINEX) 600 MG 12 hr tablet Take 600 mg by mouth 2 (two) times daily as needed for cough.    [provider]  guaiFENesin-dextromethorphan (ROBITUSSIN DM) 100-10 MG/5ML syrup Take 5 mLs by mouth every 4 (four) hours as needed for cough. 04/23/21   Hollice Espy, MD  Multiple Vitamin (MULTIVITAMIN WITH MINERALS) TABS tablet Take 1 tablet by mouth daily. [employer] 50+ multivitamin    [provider]  omeprazole (PRILOSEC)  20 MG capsule Take 1 capsule (20 mg total) by mouth daily. 04/23/21   Hollice Espy, MD      Allergies    Penicillins and Doxycycline    Review of Systems   Review of Systems  All other systems reviewed and are negative.   Physical Exam Updated Vital Signs BP (!) 135/92 (BP Location: Left Arm)   Pulse 85   Temp 97.9 F (36.6 C) (Oral)   Resp 17   SpO2 99%  Physical Exam Vitals and nursing note reviewed.  Constitutional:      Appearance: He is well-developed.  HENT:     Head: Normocephalic.     Mouth/Throat:     Mouth: Mucous membranes are moist.  Cardiovascular:     Rate and Rhythm: Normal rate and regular rhythm.  Pulmonary:     Effort: Pulmonary effort is normal.     Breath sounds: Normal breath sounds.  Abdominal:     General: There is no distension.  Musculoskeletal:        General: Swelling and tenderness present.     Cervical back: Normal range of motion.     Comments: Enlarged bursa left elbow arm hot to touch from mid forearm to mid upper arm, pain with range of motion, neurovascular intact  Skin:    Findings: Erythema present.  Neurological:     General: No focal deficit present.     Mental  Status: He is alert and oriented to person, place, and time.  Psychiatric:        Mood and Affect: Mood normal.     ED Results / Procedures / Treatments   Labs (all labs ordered are listed, but only abnormal results are displayed) Labs Reviewed  CULTURE, BLOOD (ROUTINE X 2)  CULTURE, BLOOD (ROUTINE X 2)  CBC WITH DIFFERENTIAL/PLATELET  COMPREHENSIVE METABOLIC PANEL    EKG None  Radiology No results found.  Procedures Procedures    Medications Ordered in ED Medications - No data to display  ED Course/ Medical Decision Making/ A&P                                 Medical Decision Making Patient complains of swelling to his left arm after giving plasma.  Pt reports he has pain from wrist to upper arm.   Amount and/or Complexity of Data  Reviewed Labs: ordered. Decision-making details documented in ED Course.    Details: Abs ordered reviewed and interpreted.  Patient is white blood cell count of 10.6 Radiology: ordered.    Details: Elbow shows 7 mm foreign body dorsal aspect Ultrasound shows no evidence of DVT Discussion of management or test interpretation with external provider(s): Hospitalist consulted for admission            Final Clinical Impression(s) / ED Diagnoses Final diagnoses:  Cellulitis of left arm    Rx / DC Orders ED Discharge Orders     None         Osie Cheeks 06/11/23 1701    Lorre Nick, MD 06/12/23 1706

## 2023-06-11 NOTE — H&P (Signed)
History and Physical    Patient: Keith Swanson YQM:[id number] DOB: [birth year] DOA: 06/11/2023 DOS: the patient was seen and examined on 06/11/2023 PCP: Pcp, No  Patient coming from: Home  Chief Complaint:  Chief Complaint  Patient presents with   Elbow Pain   HPI: Keith Swanson is a 47 y.o. male with medical history significant of anaphylaxis, asthma, bronchitis, mild tobacco use, alcohol abuse in remission but occasionally has 1 or 2 drinks during the weekend, history of lower back pain who is presenting to the emergency department complaints of left elbow pain, erythema, calor and edema.  The pain and the decrease in ROM mating come to the emergency department.  He stated he has been having some tingling in his left hand.  He has felt mildly febrile and had a night sweat last night.  The patient stated that he went to donate plasma 2 days ago and they have problems with the needle and had to switch sides. He denied rhinorrhea, sore throat, wheezing or hemoptysis.  No chest pain, palpitations, diaphoresis, PND, orthopnea or pitting edema of the lower extremities.  No abdominal pain, nausea, emesis, diarrhea, constipation, melena or hematochezia.  No flank pain, dysuria, frequency or hematuria.  No polyuria, polydipsia, polyphagia or blurred vision.   Lab work: His CBC showed a white count of 10.6, hemoglobin 13.9 g/dL platelets 528.  CMP with normal electrolytes after calcium correction, normal glucose and renal function.  Total protein 6.2 and albumin 3.4 g/dL.  AST 42 and ALT 57 units/L.  Normal alk phos and total bilirubin.  Imaging: Left elbow x-ray with no acute fracture or dislocation.  There is a metallic 7 mm density overlying the dorsal soft tissues of the proximal ulna.  Recommend correlation with any history of remote trauma.  Soft tissue edema overlying the olecranon is no nonspecific but can be seen in the setting of olecranon bursitis.   ED course: Initial vital signs were temperature 97.9  F, pulse 85, respirations 17, BP 135/92 mmHg O2 sat 99% on room air.  The patient received ceftriaxone 2 g IVPB.  Review of Systems: As mentioned in the history of present illness. All other systems reviewed and are negative. Past Medical History:  Diagnosis Date   Anaphylaxis    Asthma    Bronchitis    Low back pain    Past Surgical History:  Procedure Laterality Date   FACIAL FRACTURE SURGERY     WISDOM TOOTH EXTRACTION     Social History:  reports that he has quit smoking. His smoking use included cigarettes. He has never used smokeless tobacco. He reports that he does not currently use alcohol. He reports that he does not use drugs.  Allergies  Allergen Reactions   Penicillins Swelling    Did it involve swelling of the face/tongue/throat, SOB, or low BP? Yes Did it involve sudden or severe rash/hives, skin peeling, or any reaction on the inside of your mouth or nose? No Did you need to seek medical attention at a hospital or doctor's office? No When did it last happen?     childhood  If all above answers are "NO", may proceed with cephalosporin use.    Doxycycline Hives    Family History  Problem Relation Age of Onset   Hypertension Mother    Heart attack Father    Cancer Other     Prior to Admission medications   Medication Sig Start Date End Date Taking? Authorizing Provider  albuterol (PROVENTIL) (2.5 MG/3ML) 0.083%  nebulizer solution Take 3 mLs (2.5 mg total) by nebulization every 6 (six) hours as needed for wheezing or shortness of breath. 04/08/21   Molpus, John, MD  albuterol (VENTOLIN HFA) 108 (90 Base) MCG/ACT inhaler Inhale 2 puffs into the lungs every 4 (four) hours as needed for wheezing or shortness of breath. 09/30/20 09/30/21  [provider]  guaiFENesin (MUCINEX) 600 MG 12 hr tablet Take 600 mg by mouth 2 (two) times daily as needed for cough.    [provider]  guaiFENesin-dextromethorphan (ROBITUSSIN DM) 100-10 MG/5ML syrup Take 5 mLs  by mouth every 4 (four) hours as needed for cough. 04/23/21   Hollice Espy, MD  Multiple Vitamin (MULTIVITAMIN WITH MINERALS) TABS tablet Take 1 tablet by mouth daily. [employer] 50+ multivitamin    [provider]  omeprazole (PRILOSEC) 20 MG capsule Take 1 capsule (20 mg total) by mouth daily. 04/23/21   Hollice Espy, MD    Physical Exam: Vitals:   06/11/23 1354  BP: (!) 135/92  Pulse: 85  Resp: 17  Temp: 97.9 F (36.6 C)  TempSrc: Oral  SpO2: 99%   Physical Exam Vitals and nursing note reviewed.  Constitutional:      Appearance: Normal appearance.  HENT:     Head: Normocephalic.     Nose: No rhinorrhea.     Mouth/Throat:     Mouth: Mucous membranes are moist.  Eyes:     General: No scleral icterus.    Pupils: Pupils are equal, round, and reactive to light.  Cardiovascular:     Rate and Rhythm: Normal rate and regular rhythm.  Pulmonary:     Effort: Pulmonary effort is normal.     Breath sounds: Normal breath sounds.  Abdominal:     General: Bowel sounds are normal. There is no distension.     Palpations: Abdomen is soft.     Tenderness: There is no abdominal tenderness.  Musculoskeletal:     Cervical back: Neck supple.  Skin:    General: Skin is warm and dry.     Findings: Erythema present.  Neurological:     General: No focal deficit present.     Mental Status: He is alert and oriented to person, place, and time.  Psychiatric:        Mood and Affect: Mood normal.        Behavior: Behavior normal.           Data Reviewed:  Results are pending, will review when available.  Assessment and Plan: Principal Problem:   Cellulitis of left elbow Inpatient/MedSurg. Analgesics as needed. Antiemetics as needed. Continue ceftriaxone 2 g IVPB daily. Follow blood culture and sensitivity. Follow CBC, CMP in AM.  Active Problems:   Mild intermittent asthma with (acute) exacerbation Bronchodilators as needed.    Transaminitis Check RUQ  ultrasound. Follow transaminases in AM. May benefit from alcohol cessation.    Hypoalbuminemia In the setting of acute infection.    Tobacco use Tobacco cessation advised. Nicotine replacement therapy as needed.    Advance Care Planning:   Code Status: Full Code   Consults:   Family Communication:   Severity of Illness: The appropriate patient status for this patient is INPATIENT. Inpatient status is judged to be reasonable and necessary in order to provide the required intensity of service to ensure the patient's safety. The patient's presenting symptoms, physical exam findings, and initial radiographic and laboratory data in the context of their chronic comorbidities is felt to place them  at high risk for further clinical deterioration. Furthermore, it is not anticipated that the patient will be medically stable for discharge from the hospital within 2 midnights of admission.   * I certify that at the point of admission it is my clinical judgment that the patient will require inpatient hospital care spanning beyond 2 midnights from the point of admission due to high intensity of service, high risk for further deterioration and high frequency of surveillance required.*  Author: Bobette Mo, MD 06/11/2023 4:56 PM  For on call review [email or web address].   This document was prepared using Dragon voice recognition software and may contain some unintended transcription errors.

## 2023-06-11 NOTE — Progress Notes (Signed)
Left upper extremity venous duplex has been completed. Preliminary results can be found in CV Proc through chart review.  Results were given to Dr. Freida Busman.  06/11/23 4:02 PM Olen Cordial RVT

## 2023-06-11 NOTE — ED Notes (Signed)
ED TO INPATIENT HANDOFF REPORT  Name/Age/Gender Keith Swanson 47 y.o. male  Code Status    Code Status Orders  (From admission, onward)           Start     Ordered   06/11/23 1658  Full code  Continuous       Question:  By:  Answer:  Consent: discussion documented in EHR   06/11/23 1659           Code Status History     Date Active Date Inactive Code Status Order ID Comments User Context   04/22/2021 0613 04/23/2021 2322 Full Code [id number]  Marinda Elk, MD ED   12/19/2018 0150 12/21/2018 1416 Full Code [id number]  Therisa Doyne, MD Inpatient       Home/SNF/Other Home  Chief Complaint Cellulitis of left elbow [L03.114]  Level of Care/Admitting Diagnosis ED Disposition     ED Disposition  Admit   Condition  --   Comment  Hospital [facility]: [facility] [100102]  Level of Care: Med-Surg [16]  May admit patient to [facility] or [facility] if equivalent level of care is available:: No  Covid Evaluation: Asymptomatic - no recent exposure (last 10 days) testing not required  Diagnosis: Cellulitis of left elbow [7322025]  Admitting Physician: Bobette Mo [[id number]]  Attending Physician: Bobette Mo [[id number]]  Certification:: I certify this patient will need inpatient services for at least 2 midnights  Expected Medical Readiness: 06/13/2023          Medical History Past Medical History:  Diagnosis Date   Anaphylaxis    Asthma    Bronchitis    Low back pain     Allergies Allergies  Allergen Reactions   Penicillins Swelling    Did it involve swelling of the face/tongue/throat, SOB, or low BP? Yes Did it involve sudden or severe rash/hives, skin peeling, or any reaction on the inside of your mouth or nose? No Did you need to seek medical attention at a hospital or doctor's office? No When did it last happen?     childhood  If all above answers are "NO", may proceed with cephalosporin use.    Doxycycline Hives     IV Location/Drains/Wounds Patient Lines/Drains/Airways Status     Active Line/Drains/Airways     Name Placement date Placement time Site Days   Peripheral IV 06/11/23 20 G Anterior;Right Wrist 06/11/23  1442  Wrist  less than 1            Labs/Imaging Results for orders placed or performed during the hospital encounter of 06/11/23 (from the past 48 hour(s))  CBC with Differential     Status: Abnormal   Collection Time: 06/11/23  2:34 PM  Result Value Ref Range   WBC 10.6 (H) 4.0 - 10.5 K/uL   RBC 4.77 4.22 - 5.81 MIL/uL   Hemoglobin 13.9 13.0 - 17.0 g/dL   HCT 17.6 16.0 - 73.7 %   MCV 87.0 80.0 - 100.0 fL   MCH 29.1 26.0 - 34.0 pg   MCHC 33.5 30.0 - 36.0 g/dL   RDW 10.6 26.9 - 48.5 %   Platelets 194 150 - 400 K/uL   nRBC 0.0 0.0 - 0.2 %   Neutrophils Relative % 64 %   Neutro Abs 6.8 1.7 - 7.7 K/uL   Lymphocytes Relative 22 %   Lymphs Abs 2.3 0.7 - 4.0 K/uL   Monocytes Relative 9 %   Monocytes Absolute 1.0 0.1 - 1.0  K/uL   Eosinophils Relative 3 %   Eosinophils Absolute 0.3 0.0 - 0.5 K/uL   Basophils Relative 0 %   Basophils Absolute 0.0 0.0 - 0.1 K/uL   Immature Granulocytes 2 %   Abs Immature Granulocytes 0.16 (H) 0.00 - 0.07 K/uL    Comment: Performed at [facility], [address], [place], [place] [address]  Comprehensive metabolic panel     Status: Abnormal   Collection Time: 06/11/23  2:34 PM  Result Value Ref Range   Sodium 138 135 - 145 mmol/L   Potassium 3.5 3.5 - 5.1 mmol/L   Chloride 103 98 - 111 mmol/L   CO2 25 22 - 32 mmol/L   Glucose, Bld 98 70 - 99 mg/dL    Comment: Glucose reference range applies only to samples taken after fasting for at least 8 hours.   BUN 9 6 - 20 mg/dL   Creatinine, Ser 9.81 0.61 - 1.24 mg/dL   Calcium 8.5 (L) 8.9 - 10.3 mg/dL   Total Protein 6.2 (L) 6.5 - 8.1 g/dL   Albumin 3.4 (L) 3.5 - 5.0 g/dL   AST 42 (H) 15 - 41 U/L   ALT 57 (H) 0 - 44 U/L   Alkaline Phosphatase 54 38 - 126 U/L   Total  Bilirubin 0.8 0.3 - 1.2 mg/dL   GFR, Estimated >19 >14 mL/min    Comment: (NOTE) Calculated using the CKD-EPI Creatinine Equation (2021)    Anion gap 10 5 - 15    Comment: Performed at [facility], [address], [place], [place] [address]   UE Venous Duplex [facility] and [facility] ONLY)  Result Date: 06/11/2023 UPPER VENOUS STUDY  Patient Name:  Keith Swanson  Date of Exam:   06/11/2023 Medical Rec #: [id number]    Accession #:    [id number] Date of Birth: 26-Oct-1975    Patient Gender: M Patient Age:   62 years Exam Location:  [facility] Procedure:      VAS [place] UPPER EXTREMITY VENOUS DUPLEX Referring Phys: Cheron Schaumann --------------------------------------------------------------------------------  Indications: Pain Performing Technologist: Chanda Busing RVT  Examination Guidelines: A complete evaluation includes B-mode imaging, spectral Doppler, color Doppler, and power Doppler as needed of all accessible portions of each vessel. Bilateral testing is considered an integral part of a complete examination. Limited examinations for reoccurring indications may be performed as noted.  Right Findings: +----------+------------+---------+-----------+----------+-------+ RIGHT     CompressiblePhasicitySpontaneousPropertiesSummary +----------+------------+---------+-----------+----------+-------+ Subclavian    Full       Yes       Yes                      +----------+------------+---------+-----------+----------+-------+  Left Findings: +----------+------------+---------+-----------+----------+-------+ LEFT      CompressiblePhasicitySpontaneousPropertiesSummary +----------+------------+---------+-----------+----------+-------+ IJV           Full       Yes       Yes                      +----------+------------+---------+-----------+----------+-------+ Subclavian    Full       Yes       Yes                       +----------+------------+---------+-----------+----------+-------+ Axillary      Full       Yes       Yes                      +----------+------------+---------+-----------+----------+-------+  Brachial      Full                                          +----------+------------+---------+-----------+----------+-------+ Radial        Full                                          +----------+------------+---------+-----------+----------+-------+ Ulnar         Full                                          +----------+------------+---------+-----------+----------+-------+ Cephalic      Full                                          +----------+------------+---------+-----------+----------+-------+ Basilic       Full                                          +----------+------------+---------+-----------+----------+-------+  Summary:  Right: No evidence of thrombosis in the subclavian.  Left: No evidence of deep vein thrombosis in the upper extremity. No evidence of superficial vein thrombosis in the upper extremity.  *See table(s) above for measurements and observations.    Preliminary    DG Elbow Complete Left  Result Date: 06/11/2023 CLINICAL DATA:  pain EXAM: LEFT ELBOW - COMPLETE 3+ VIEW COMPARISON:  None Available. FINDINGS: No acute fracture or dislocation. Joint spaces and alignment are maintained. Enthesopathic changes of the of the olecranon. No area of erosion or osseous destruction. Metallic 7 by 2 x 2 mm rectangular density overlying the dorsal soft tissues of the proximal ulna. Soft tissue edema overlying the olecranon. IMPRESSION: 1. No acute fracture or dislocation. 2. Metallic 7 mm density overlying the dorsal soft tissues of the proximal ulna. Recommend correlation with any history of remote trauma. 3. Soft tissue edema overlying the olecranon is nonspecific but can be seen in the setting of olecranon bursitis. Electronically Signed   By: Meda Klinefelter  M.D.   On: 06/11/2023 15:38    Pending Labs Unresulted Labs (From admission, onward)     Start     Ordered   06/12/23 0500  HIV Antibody (routine testing w rflx)  (HIV Antibody (Routine testing w reflex) panel)  Tomorrow morning,   R        06/11/23 1659   06/12/23 0500  CBC  Tomorrow morning,   R        06/11/23 1659   06/12/23 0500  Comprehensive metabolic panel  Tomorrow morning,   R        06/11/23 1659   06/11/23 1435  Blood culture (routine x 2)  BLOOD CULTURE X 2,   R     Question Answer Comment  Patient immune status Normal   Release to patient Immediate      06/11/23 1435            Vitals/Pain Today's Vitals   06/11/23 1354 06/11/23 1401 06/11/23 1449  BP: Marland Kitchen)  135/92    Pulse: 85    Resp: 17    Temp: 97.9 F (36.6 C)    TempSrc: Oral    SpO2: 99%    PainSc:  10-Worst pain ever 4     Isolation Precautions No active isolations  Medications Medications  enoxaparin (LOVENOX) injection 40 mg (has no administration in time range)  acetaminophen (TYLENOL) tablet 650 mg (has no administration in time range)    Or  acetaminophen (TYLENOL) suppository 650 mg (has no administration in time range)  ondansetron (ZOFRAN) tablet 4 mg (has no administration in time range)    Or  ondansetron (ZOFRAN) injection 4 mg (has no administration in time range)  cefTRIAXone (ROCEPHIN) 2 g in sodium chloride 0.9 % 100 mL IVPB (has no administration in time range)  cefTRIAXone (ROCEPHIN) 2 g in sodium chloride 0.9 % 100 mL IVPB (2 g Intravenous New Bag/Given 06/11/23 1631)    Mobility walks

## 2023-06-11 NOTE — ED Triage Notes (Signed)
Pt reports left elbow pain and swelling. Area is warm to the touch and swollen. Denies fevers.

## 2023-06-11 NOTE — Plan of Care (Signed)

## 2023-06-12 ENCOUNTER — Encounter (HOSPITAL_COMMUNITY): Payer: Self-pay

## 2023-06-12 ENCOUNTER — Inpatient Hospital Stay (HOSPITAL_COMMUNITY): Payer: Medicaid Other

## 2023-06-12 DIAGNOSIS — L03114 Cellulitis of left upper limb: Secondary | ICD-10-CM

## 2023-06-12 LAB — CBC
HCT: 42.2 % (ref 39.0–52.0)
Hemoglobin: 14.2 g/dL (ref 13.0–17.0)
MCH: 29.7 pg (ref 26.0–34.0)
MCHC: 33.6 g/dL (ref 30.0–36.0)
MCV: 88.3 fL (ref 80.0–100.0)
Platelets: 199 10*3/uL (ref 150–400)
RBC: 4.78 MIL/uL (ref 4.22–5.81)
RDW: 13.1 % (ref 11.5–15.5)
WBC: 12.2 10*3/uL — ABNORMAL HIGH (ref 4.0–10.5)
nRBC: 0 % (ref 0.0–0.2)

## 2023-06-12 LAB — COMPREHENSIVE METABOLIC PANEL
ALT: 48 U/L — ABNORMAL HIGH (ref 0–44)
AST: 34 U/L (ref 15–41)
Albumin: 3.1 g/dL — ABNORMAL LOW (ref 3.5–5.0)
Alkaline Phosphatase: 59 U/L (ref 38–126)
Anion gap: 8 (ref 5–15)
BUN: 13 mg/dL (ref 6–20)
CO2: 26 mmol/L (ref 22–32)
Calcium: 8.3 mg/dL — ABNORMAL LOW (ref 8.9–10.3)
Chloride: 102 mmol/L (ref 98–111)
Creatinine, Ser: 0.83 mg/dL (ref 0.61–1.24)
GFR, Estimated: >60 mL/min (ref >60)
Glucose, Bld: 132 mg/dL — ABNORMAL HIGH (ref 70–99)
Potassium: 3.7 mmol/L (ref 3.5–5.1)
Sodium: 136 mmol/L (ref 135–145)
Total Bilirubin: 0.8 mg/dL (ref 0.3–1.2)
Total Protein: 5.9 g/dL — ABNORMAL LOW (ref 6.5–8.1)

## 2023-06-12 LAB — HIV ANTIBODY (ROUTINE TESTING W REFLEX): HIV Screen 4th Generation wRfx: NONREACTIVE

## 2023-06-12 MED ORDER — HYDROXYZINE HCL 10 MG PO TABS
10.0000 mg | ORAL_TABLET | Freq: Once | ORAL | Status: AC
  Administered 2023-06-12: 10 mg via ORAL
  Filled 2023-06-12: qty 1

## 2023-06-12 NOTE — Progress Notes (Signed)
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  We originally planned for irrigation and debridement this evening.  I did discuss n.p.o. status with the patient himself and with RN earlier today.  Against advice patient did consume salad and pizza at 3 PM.  He understands that this will delay his surgery and affect his ultimate outcome per our earlier discussion today.  Will plan to proceed with surgery tomorrow either rounds ~10 AM after first case or in the evening after clinic.  Keep n.p.o. after midnight tonight.  Discontinued Lovenox.  Transfer order was discontinued after discussion with RN.Marland Kitchen
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
## 2023-06-12 NOTE — Plan of Care (Signed)
  Problem: Clinical Measurements: Goal: Ability to maintain clinical measurements within normal limits will improve Outcome: Progressing   Problem: Coping: Goal: Level of anxiety will decrease Outcome: Progressing   Problem: Pain Managment: Goal: General experience of comfort will improve Outcome: Progressing   Problem: Safety: Goal: Ability to remain free from injury will improve Outcome: Progressing   

## 2023-06-12 NOTE — Progress Notes (Signed)
PROGRESS NOTE  Keith Swanson  DOB: [birth year]  PCP: Aviva Kluver QMV:[id number]  DOA: 06/11/2023  LOS: 1 day  Hospital Day: 2  Brief narrative: Keith Swanson is a 47 y.o. male with PMH significant for anaphylaxis, asthma, bronchitis, tobacco use, alcohol use in remission and currently drinks occasionally only on weekends. 9/7, patient presented to the ED with complaint of left elbow pain, swelling and redness and decreased range of movement. Patient reports she went to donate plasma 2 days ago and had problems with needle and had to switch sides.  He developed symptoms since then.  In the ED, patient was hemodynamically stable Labs with WBC count of 10.6, AST/ALT mildly elevated to 42/57 Left elbow x-ray did not show any no acute fracture or dislocation.  It showed a metallic 7 mm density overlying the dorsal soft tissues of the proximal ulna.   Recommend correlation with any history of remote trauma.  Soft tissue edema overlying the olecranon is no nonspecific but can be seen in the setting of olecranon bursitis. Blood culture was sent Patient was given IV Rocephin Admitted to [facility]  Subjective: Patient was seen and examined this morning. Noted to have significant swelling of the left elbow that extends both proximally and distally.  No draining wound.  Limited range of movement. Remains hemodynamically stable Lab this morning with WBC count further up to 12.2  Assessment and plan: Left elbow cellulitis No clinical and radiological evidence of abscess or collection  However swelling seems to be significant and left elbow has significant limitation in the range of motion as well. Currently on IV Rocephin Orthopedic consult appreciated.  Recommended MRI of the left elbow to rule out septic arthritis and abscess Pain management with as needed Tylenol, as needed Toradol, as needed oxycodone Recent Labs  Lab 06/11/23 1434 06/12/23 0258  WBC 10.6* 12.2*   Mild intermittent  asthma Bronchodilators as needed.  Transaminitis Alcoholism RUQ ultrasound with no acute findings of gallstones or cholecystitis. Continue monitor LFTs Recommend alcohol cessation Recent Labs  Lab 06/11/23 1434 06/12/23 0258  AST 42* 34  ALT 57* 48*  ALKPHOS 54 59  BILITOT 0.8 0.8  PROT 6.2* 5.9*  ALBUMIN 3.4* 3.1*  PLT 194 199   Hypoalbuminemia In the setting of acute infection.   Tobacco use Tobacco cessation advised. Nicotine replacement therapy as needed.    Mobility: Encourage ambulation  Goals of care   Code Status: Full Code     DVT prophylaxis:  enoxaparin (LOVENOX) injection 40 mg Start: 06/11/23 2200   Antimicrobials: IV Rocephin Fluid: None Consultants: Orthopedics Family Communication: None at bedside  Status: Inpatient Level of care:  Med-Surg   Patient is from: Home Needs to continue in-hospital care: Pending workup and treatment Anticipated d/c to: Likely home ultimately    Diet:  Diet Order             Diet NPO time specified  Diet effective now                   Scheduled Meds:  enoxaparin (LOVENOX) injection  40 mg Subcutaneous Q24H    PRN meds: acetaminophen **OR** acetaminophen, ketorolac, ondansetron **OR** ondansetron (ZOFRAN) IV, oxyCODONE   Infusions:   cefTRIAXone (ROCEPHIN)  IV 2 g (06/12/23 0948)    Antimicrobials: Anti-infectives (From admission, onward)    Start     Dose/Rate Route Frequency Ordered Stop   06/12/23 1000  cefTRIAXone (ROCEPHIN) 2 g in sodium chloride 0.9 % 100 mL IVPB  2 g 200 mL/hr over 30 Minutes Intravenous Every 24 hours 06/11/23 1700 06/19/23 0959   06/11/23 1615  cefTRIAXone (ROCEPHIN) 2 g in sodium chloride 0.9 % 100 mL IVPB        2 g 200 mL/hr over 30 Minutes Intravenous  Once 06/11/23 1608 06/11/23 1725       Nutritional status:  Body mass index is 28.35 kg/m.          Objective: Vitals:   06/12/23 0609 06/12/23 1315  BP: (!) 127/96 (!) 137/98  Pulse: 71  72  Resp: 18 17  Temp: 98.6 F (37 C) 97.9 F (36.6 C)  SpO2: 99% 99%    Intake/Output Summary (Last 24 hours) at 06/12/2023 1418 Last data filed at 06/12/2023 0600 Gross per 24 hour  Intake 580 ml  Output --  Net 580 ml   Filed Weights   06/11/23 1800  Weight: 94.8 kg   Weight change:  Body mass index is 28.35 kg/m.   Physical Exam: General exam: Pleasant, Skin: No rashes, lesions or ulcers. HEENT: Atraumatic, normocephalic, no obvious bleeding Lungs: Clear to auscultation bilaterally CVS: Regular rate and rhythm, no murmur GI/Abd soft, nontender, nondistended, bowel sound present CNS: Alert, awake, oriented x 3 Psychiatry: Mood appropriate Extremities: No pedal edema, no calf tenderness.  Noted to have left elbow swelling extending proximally and distally  Data Review: I have personally reviewed the laboratory data and studies available.  F/u labs ordered Unresulted Labs (From admission, onward)     Start     Ordered   06/12/23 1200  Rapid urine drug screen (hospital performed)  Once,   R        06/12/23 1200   Unscheduled  CBC with Differential/Platelet  Tomorrow morning,   R        06/12/23 1418   Unscheduled  Basic metabolic panel  Tomorrow morning,   R        06/12/23 1418            Total time spent in review of labs and imaging, patient evaluation, formulation of plan, documentation and communication with family: 45 minutes  Signed, Lorin Glass, MD [facility] 06/12/2023

## 2023-06-13 ENCOUNTER — Encounter (HOSPITAL_COMMUNITY)
Admission: EM | Disposition: A | Discharge status: Home / Self Care | Payer: Self-pay | Source: Home / Self Care | Attending: Internal Medicine

## 2023-06-13 ENCOUNTER — Other Ambulatory Visit: Payer: Self-pay

## 2023-06-13 ENCOUNTER — Inpatient Hospital Stay (HOSPITAL_COMMUNITY): Payer: Medicaid Other | Admitting: Anesthesiology

## 2023-06-13 ENCOUNTER — Encounter (HOSPITAL_COMMUNITY): Payer: Self-pay

## 2023-06-13 ENCOUNTER — Inpatient Hospital Stay (HOSPITAL_COMMUNITY): Payer: Medicaid Other

## 2023-06-13 DIAGNOSIS — L03114 Cellulitis of left upper limb: Secondary | ICD-10-CM | POA: Diagnosis not present

## 2023-06-13 DIAGNOSIS — M00022 Staphylococcal arthritis, left elbow: Secondary | ICD-10-CM

## 2023-06-13 DIAGNOSIS — M0080 Arthritis due to other bacteria, unspecified joint: Secondary | ICD-10-CM

## 2023-06-13 HISTORY — PX: I & D EXTREMITY: SHX5045

## 2023-06-13 LAB — BASIC METABOLIC PANEL
Anion gap: 8 (ref 5–15)
BUN: 16 mg/dL (ref 6–20)
CO2: 25 mmol/L (ref 22–32)
Calcium: 8.3 mg/dL — ABNORMAL LOW (ref 8.9–10.3)
Chloride: 102 mmol/L (ref 98–111)
Creatinine, Ser: 0.91 mg/dL (ref 0.61–1.24)
GFR, Estimated: >60 mL/min (ref >60)
Glucose, Bld: 117 mg/dL — ABNORMAL HIGH (ref 70–99)
Potassium: 3.8 mmol/L (ref 3.5–5.1)
Sodium: 135 mmol/L (ref 135–145)

## 2023-06-13 LAB — RAPID URINE DRUG SCREEN, HOSP PERFORMED
Amphetamines: NOT DETECTED
Barbiturates: NOT DETECTED
Benzodiazepines: NOT DETECTED
Cocaine: NOT DETECTED
Opiates: NOT DETECTED
Tetrahydrocannabinol: NOT DETECTED

## 2023-06-13 LAB — CBC WITH DIFFERENTIAL/PLATELET
Abs Immature Granulocytes: 0.16 10*3/uL — ABNORMAL HIGH (ref 0.00–0.07)
Basophils Absolute: 0.1 10*3/uL (ref 0.0–0.1)
Basophils Relative: 0 %
Eosinophils Absolute: 0.4 10*3/uL (ref 0.0–0.5)
Eosinophils Relative: 3 %
HCT: 39.4 % (ref 39.0–52.0)
Hemoglobin: 13.2 g/dL (ref 13.0–17.0)
Immature Granulocytes: 1 %
Lymphocytes Relative: 27 %
Lymphs Abs: 3 10*3/uL (ref 0.7–4.0)
MCH: 29.3 pg (ref 26.0–34.0)
MCHC: 33.5 g/dL (ref 30.0–36.0)
MCV: 87.6 fL (ref 80.0–100.0)
Monocytes Absolute: 1.2 10*3/uL — ABNORMAL HIGH (ref 0.1–1.0)
Monocytes Relative: 11 %
Neutro Abs: 6.6 10*3/uL (ref 1.7–7.7)
Neutrophils Relative %: 58 %
Platelets: 196 10*3/uL (ref 150–400)
RBC: 4.5 MIL/uL (ref 4.22–5.81)
RDW: 12.9 % (ref 11.5–15.5)
WBC: 11.4 10*3/uL — ABNORMAL HIGH (ref 4.0–10.5)
nRBC: 0 % (ref 0.0–0.2)

## 2023-06-13 SURGERY — IRRIGATION AND DEBRIDEMENT EXTREMITY
Anesthesia: General | Laterality: Left

## 2023-06-13 MED ORDER — HYDRALAZINE HCL 20 MG/ML IJ SOLN
10.0000 mg | Freq: Four times a day (QID) | INTRAMUSCULAR | Status: DC | PRN
  Administered 2023-06-13: 10 mg via INTRAVENOUS
  Filled 2023-06-13: qty 1

## 2023-06-13 MED ORDER — CEFAZOLIN SODIUM-DEXTROSE 2-4 GM/100ML-% IV SOLN
2.0000 g | INTRAVENOUS | Status: AC
  Administered 2023-06-13: 2 g via INTRAVENOUS
  Filled 2023-06-13: qty 100

## 2023-06-13 MED ORDER — HYDROXYZINE HCL 10 MG PO TABS
10.0000 mg | ORAL_TABLET | Freq: Two times a day (BID) | ORAL | Status: DC | PRN
  Administered 2023-06-13 (×2): 10 mg via ORAL
  Filled 2023-06-13 (×3): qty 1

## 2023-06-13 MED ORDER — LIDOCAINE 2% (20 MG/ML) 5 ML SYRINGE
INTRAMUSCULAR | Status: DC | PRN
  Administered 2023-06-13: 80 mg via INTRAVENOUS

## 2023-06-13 MED ORDER — DEXAMETHASONE SODIUM PHOSPHATE 10 MG/ML IJ SOLN
INTRAMUSCULAR | Status: AC
  Filled 2023-06-13: qty 1

## 2023-06-13 MED ORDER — METHOCARBAMOL 500 MG PO TABS
500.0000 mg | ORAL_TABLET | Freq: Four times a day (QID) | ORAL | Status: DC | PRN
  Administered 2023-06-13: 500 mg via ORAL
  Filled 2023-06-13: qty 1

## 2023-06-13 MED ORDER — METHOCARBAMOL 500 MG IVPB - SIMPLE MED: 500.0000 mg | Freq: Four times a day (QID) | INTRAVENOUS | Status: DC | PRN

## 2023-06-13 MED ORDER — POVIDONE-IODINE 7.5 % EX SOLN: Freq: Once | CUTANEOUS | Status: DC

## 2023-06-13 MED ORDER — FENTANYL CITRATE (PF) 100 MCG/2ML IJ SOLN
INTRAMUSCULAR | Status: DC | PRN
  Administered 2023-06-13: 50 ug via INTRAVENOUS
  Administered 2023-06-13: 100 ug via INTRAVENOUS
  Administered 2023-06-13: 50 ug via INTRAVENOUS

## 2023-06-13 MED ORDER — OXYCODONE HCL 5 MG PO TABS
5.0000 mg | ORAL_TABLET | ORAL | Status: DC | PRN
  Administered 2023-06-13 – 2023-06-18 (×14): 10 mg via ORAL
  Filled 2023-06-13 (×14): qty 2
  Filled 2023-06-13: qty 1

## 2023-06-13 MED ORDER — ONDANSETRON HCL 4 MG/2ML IJ SOLN: 4.0000 mg | Freq: Four times a day (QID) | INTRAMUSCULAR | Status: DC | PRN

## 2023-06-13 MED ORDER — OXYCODONE HCL 5 MG PO TABS: 5.0000 mg | ORAL_TABLET | Freq: Once | ORAL | Status: DC | PRN

## 2023-06-13 MED ORDER — IOHEXOL 300 MG/ML  SOLN
80.0000 mL | Freq: Once | INTRAMUSCULAR | Status: AC | PRN
  Administered 2023-06-13: 80 mL via INTRAVENOUS

## 2023-06-13 MED ORDER — HYDROMORPHONE HCL 1 MG/ML IJ SOLN
0.5000 mg | INTRAMUSCULAR | Status: DC | PRN
  Administered 2023-06-13 – 2023-06-14 (×2): 0.5 mg via INTRAVENOUS
  Filled 2023-06-13 (×2): qty 0.5

## 2023-06-13 MED ORDER — SODIUM CHLORIDE 0.9 % IV SOLN: INTRAVENOUS | Status: AC

## 2023-06-13 MED ORDER — BUPIVACAINE HCL (PF) 0.5 % IJ SOLN
INTRAMUSCULAR | Status: DC | PRN
  Administered 2023-06-13: 30 mL

## 2023-06-13 MED ORDER — ONDANSETRON HCL 4 MG/2ML IJ SOLN
INTRAMUSCULAR | Status: DC | PRN
  Administered 2023-06-13: 4 mg via INTRAVENOUS

## 2023-06-13 MED ORDER — PROPOFOL 10 MG/ML IV BOLUS
INTRAVENOUS | Status: AC
  Filled 2023-06-13: qty 20

## 2023-06-13 MED ORDER — LACTATED RINGERS IV SOLN
INTRAVENOUS | Status: DC | PRN
Start: 2023-06-13 — End: 2023-06-13

## 2023-06-13 MED ORDER — ROCURONIUM BROMIDE 10 MG/ML (PF) SYRINGE
PREFILLED_SYRINGE | INTRAVENOUS | Status: DC | PRN
  Administered 2023-06-13: 70 mg via INTRAVENOUS

## 2023-06-13 MED ORDER — TRANEXAMIC ACID-NACL 1000-0.7 MG/100ML-% IV SOLN
1000.0000 mg | INTRAVENOUS | Status: AC
  Administered 2023-06-13: 1000 mg via INTRAVENOUS
  Filled 2023-06-13: qty 100

## 2023-06-13 MED ORDER — FENTANYL CITRATE (PF) 100 MCG/2ML IJ SOLN
INTRAMUSCULAR | Status: AC
  Filled 2023-06-13: qty 2

## 2023-06-13 MED ORDER — VANCOMYCIN HCL 1 G IV SOLR
INTRAVENOUS | Status: DC | PRN
  Administered 2023-06-13: 1000 mg

## 2023-06-13 MED ORDER — METOCLOPRAMIDE HCL 5 MG PO TABS: 5.0000 mg | ORAL_TABLET | Freq: Three times a day (TID) | ORAL | Status: DC | PRN

## 2023-06-13 MED ORDER — METOCLOPRAMIDE HCL 5 MG/ML IJ SOLN: 5.0000 mg | Freq: Three times a day (TID) | INTRAMUSCULAR | Status: DC | PRN

## 2023-06-13 MED ORDER — DOCUSATE SODIUM 100 MG PO CAPS
100.0000 mg | ORAL_CAPSULE | Freq: Two times a day (BID) | ORAL | Status: DC
  Administered 2023-06-13 – 2023-06-17 (×9): 100 mg via ORAL
  Filled 2023-06-13 (×10): qty 1

## 2023-06-13 MED ORDER — SUGAMMADEX SODIUM 200 MG/2ML IV SOLN
INTRAVENOUS | Status: DC | PRN
  Administered 2023-06-13: 200 mg via INTRAVENOUS

## 2023-06-13 MED ORDER — HYDROMORPHONE HCL 1 MG/ML IJ SOLN
INTRAMUSCULAR | Status: AC
  Administered 2023-06-13: 0.5 mg via INTRAVENOUS
  Filled 2023-06-13: qty 1

## 2023-06-13 MED ORDER — VANCOMYCIN HCL 1000 MG IV SOLR
INTRAVENOUS | Status: AC
  Filled 2023-06-13: qty 20

## 2023-06-13 MED ORDER — OXYCODONE HCL 5 MG/5ML PO SOLN: 5.0000 mg | Freq: Once | ORAL | Status: DC | PRN

## 2023-06-13 MED ORDER — ACETAMINOPHEN 500 MG PO TABS
1000.0000 mg | ORAL_TABLET | Freq: Four times a day (QID) | ORAL | Status: AC
  Administered 2023-06-13 – 2023-06-14 (×4): 1000 mg via ORAL
  Filled 2023-06-13 (×4): qty 2

## 2023-06-13 MED ORDER — BUPIVACAINE HCL 0.25 % IJ SOLN
INTRAMUSCULAR | Status: AC
  Filled 2023-06-13: qty 1

## 2023-06-13 MED ORDER — ACETAMINOPHEN 10 MG/ML IV SOLN: 1000.0000 mg | Freq: Once | INTRAVENOUS | Status: DC | PRN

## 2023-06-13 MED ORDER — HYDROMORPHONE HCL 1 MG/ML IJ SOLN
0.2500 mg | INTRAMUSCULAR | Status: DC | PRN
  Administered 2023-06-13: 0.5 mg via INTRAVENOUS

## 2023-06-13 MED ORDER — CELECOXIB 100 MG PO CAPS
100.0000 mg | ORAL_CAPSULE | Freq: Two times a day (BID) | ORAL | Status: DC
  Administered 2023-06-14 – 2023-06-18 (×9): 100 mg via ORAL
  Filled 2023-06-13 (×10): qty 1

## 2023-06-13 MED ORDER — POVIDONE-IODINE 10 % EX SWAB: 2.0000 | Freq: Once | CUTANEOUS | Status: DC

## 2023-06-13 MED ORDER — MIDAZOLAM HCL 2 MG/2ML IJ SOLN
INTRAMUSCULAR | Status: AC
  Filled 2023-06-13: qty 2

## 2023-06-13 MED ORDER — ONDANSETRON HCL 4 MG/2ML IJ SOLN
INTRAMUSCULAR | Status: AC
  Filled 2023-06-13: qty 2

## 2023-06-13 MED ORDER — ACETAMINOPHEN 325 MG PO TABS
325.0000 mg | ORAL_TABLET | Freq: Four times a day (QID) | ORAL | Status: DC | PRN
  Administered 2023-06-14 – 2023-06-16 (×3): 650 mg via ORAL
  Filled 2023-06-13 (×3): qty 2

## 2023-06-13 MED ORDER — ONDANSETRON HCL 4 MG PO TABS: 4.0000 mg | ORAL_TABLET | Freq: Four times a day (QID) | ORAL | Status: DC | PRN

## 2023-06-13 MED ORDER — LIDOCAINE HCL (PF) 2 % IJ SOLN
INTRAMUSCULAR | Status: AC
  Filled 2023-06-13: qty 5

## 2023-06-13 MED ORDER — PROPOFOL 10 MG/ML IV BOLUS
INTRAVENOUS | Status: DC | PRN
  Administered 2023-06-13: 200 mg via INTRAVENOUS

## 2023-06-13 MED ORDER — AMISULPRIDE (ANTIEMETIC) 5 MG/2ML IV SOLN: 10.0000 mg | Freq: Once | INTRAVENOUS | Status: DC | PRN

## 2023-06-13 MED ORDER — DEXAMETHASONE SODIUM PHOSPHATE 10 MG/ML IJ SOLN
INTRAMUSCULAR | Status: DC | PRN
  Administered 2023-06-13: 5 mg via INTRAVENOUS

## 2023-06-13 MED ORDER — MIDAZOLAM HCL 5 MG/5ML IJ SOLN
INTRAMUSCULAR | Status: DC | PRN
  Administered 2023-06-13: 2 mg via INTRAVENOUS

## 2023-06-13 MED ORDER — ONDANSETRON HCL 4 MG/2ML IJ SOLN: 4.0000 mg | Freq: Once | INTRAMUSCULAR | Status: DC | PRN

## 2023-06-13 MED ORDER — ROCURONIUM BROMIDE 10 MG/ML (PF) SYRINGE
PREFILLED_SYRINGE | INTRAVENOUS | Status: AC
  Filled 2023-06-13: qty 10

## 2023-06-13 MED ORDER — VANCOMYCIN HCL IN DEXTROSE 1-5 GM/200ML-% IV SOLN
1000.0000 mg | Freq: Two times a day (BID) | INTRAVENOUS | Status: AC
  Administered 2023-06-13: 1000 mg via INTRAVENOUS
  Filled 2023-06-13: qty 200

## 2023-06-13 MED ORDER — ACETAMINOPHEN 500 MG PO TABS
1000.0000 mg | ORAL_TABLET | Freq: Once | ORAL | Status: AC
  Administered 2023-06-13: 1000 mg via ORAL
  Filled 2023-06-13: qty 2

## 2023-06-13 SURGICAL SUPPLY — 41 items
BAG COUNTER SPONGE SURGICOUNT (BAG) IMPLANT
BAG SPEC THK2 15X12 ZIP CLS (MISCELLANEOUS) ×1
BAG SPNG CNTER NS LX DISP (BAG)
BAG ZIPLOCK 12X15 (MISCELLANEOUS) ×1 IMPLANT
BANDAGE ESMARK 6X9 LF (GAUZE/BANDAGES/DRESSINGS) ×1 IMPLANT
BNDG CMPR 6 X 5 YARDS HK CLSR (GAUZE/BANDAGES/DRESSINGS) ×2
BNDG CMPR 9X6 STRL LF SNTH (GAUZE/BANDAGES/DRESSINGS) ×1
BNDG ELASTIC 6INX 5YD STR LF (GAUZE/BANDAGES/DRESSINGS) IMPLANT
BNDG ESMARK 6X9 LF (GAUZE/BANDAGES/DRESSINGS) ×1
BNDG GAUZE DERMACEA FLUFF 4 (GAUZE/BANDAGES/DRESSINGS) ×1 IMPLANT
BNDG GZE DERMACEA 4 6PLY (GAUZE/BANDAGES/DRESSINGS) ×1
COVER SURGICAL LIGHT HANDLE (MISCELLANEOUS) ×1 IMPLANT
CUFF TOURN SGL QUICK 18X4 (TOURNIQUET CUFF) IMPLANT
CUFF TOURN SGL QUICK 24 (TOURNIQUET CUFF)
CUFF TOURN SGL QUICK 34 (TOURNIQUET CUFF)
CUFF TRNQT CYL 24X4X16.5-23 (TOURNIQUET CUFF) IMPLANT
CUFF TRNQT CYL 34X4.125X (TOURNIQUET CUFF) IMPLANT
DRAIN PENROSE 0.25X18 (DRAIN) IMPLANT
DRAIN PENROSE 0.5X18 (DRAIN) ×1 IMPLANT
DURAPREP 26ML APPLICATOR (WOUND CARE) ×1 IMPLANT
ELECT REM PT RETURN 15FT ADLT (MISCELLANEOUS) ×1 IMPLANT
EVACUATOR DRAINAGE 10X20 100CC (DRAIN) IMPLANT
EVACUATOR SILICONE 100CC (DRAIN) ×1
GAUZE PAD ABD 8X10 STRL (GAUZE/BANDAGES/DRESSINGS) ×2 IMPLANT
GAUZE SPONGE 4X4 12PLY STRL (GAUZE/BANDAGES/DRESSINGS) ×1 IMPLANT
GAUZE XEROFORM 5X9 LF (GAUZE/BANDAGES/DRESSINGS) IMPLANT
GLOVE SURG ORTHO 8.0 STRL STRW (GLOVE) ×1 IMPLANT
GOWN STRL REUS W/ TWL LRG LVL3 (GOWN DISPOSABLE) ×1 IMPLANT
GOWN STRL REUS W/TWL LRG LVL3 (GOWN DISPOSABLE) ×1
HANDPIECE INTERPULSE COAX TIP (DISPOSABLE) ×1
KIT BASIN OR (CUSTOM PROCEDURE TRAY) ×1 IMPLANT
KIT TURNOVER KIT A (KITS) IMPLANT
PACK TOTAL JOINT (CUSTOM PROCEDURE TRAY) ×1 IMPLANT
PAD CAST 4YDX4 CTTN HI CHSV (CAST SUPPLIES) ×1 IMPLANT
PADDING CAST COTTON 4X4 STRL (CAST SUPPLIES) ×1
PADDING CAST COTTON 6X4 STRL (CAST SUPPLIES) IMPLANT
PROTECTOR NERVE ULNAR (MISCELLANEOUS) ×1 IMPLANT
SET HNDPC FAN SPRY TIP SCT (DISPOSABLE) ×1 IMPLANT
SUT ETHILON 2 0 PS N (SUTURE) IMPLANT
SYR CONTROL 10ML LL (SYRINGE) ×1 IMPLANT
TOWEL OR 17X26 10 PK STRL BLUE (TOWEL DISPOSABLE) ×2 IMPLANT

## 2023-06-13 NOTE — Progress Notes (Signed)
PROGRESS NOTE  Keith Swanson  DOB: [birth year]  PCP: Aviva Kluver NFA:[id number]  DOA: 06/11/2023  LOS: 2 days  Hospital Day: 3  Brief narrative: Keith Swanson is a 47 y.o. male with PMH significant for anaphylaxis, asthma, bronchitis, tobacco use, alcohol use in remission and currently drinks occasionally only on weekends. 9/7, patient presented to the ED with complaint of left elbow pain, swelling and redness and decreased range of movement. Patient reports she went to donate plasma 2 days ago and had problems with needle and had to switch sides.  He developed symptoms since then.  In the ED, patient was hemodynamically stable Labs with WBC count of 10.6, AST/ALT mildly elevated to 42/57 Left elbow x-ray did not show any no acute fracture or dislocation.  It showed a metallic 7 mm density overlying the dorsal soft tissues of the proximal ulna.   Recommend correlation with any history of remote trauma.  Soft tissue edema overlying the olecranon is no nonspecific but can be seen in the setting of olecranon bursitis. Blood culture was sent Patient was given IV Rocephin Admitted to [facility]  Subjective: Patient was seen and examined this morning. Lying on the bed not in distress.  Family at bedside.  Was waiting for orthopedics for left elbow x-ray  Assessment and plan: Left elbow cellulitis Possible septic arthritis/olecranon bursitis Presented with left elbow cellulitis.  Initial CT scan did not show any evidence of abscess. However on examination next day, I noticed significant swelling of the left elbow area and limitation in the range of motion as well. Orthopedic consultation was obtained.  MRI left there was open which showed possibility of left septic arthritis and electroretinogram bursitis. Orthopedic plan for surgical exploration today. Currently on IV Rocephin Pain management with as needed Tylenol, as needed Toradol, as needed oxycodone Recent Labs  Lab 06/11/23 1434 06/12/23 0258  06/13/23 0259  WBC 10.6* 12.2* 11.4*   Mild intermittent asthma Bronchodilators as needed.  Transaminitis Alcoholism RUQ ultrasound with no acute findings of gallstones or cholecystitis. Continue monitor LFTs Recommend alcohol cessation Recent Labs  Lab 06/11/23 1434 06/12/23 0258 06/13/23 0259  AST 42* 34  --   ALT 57* 48*  --   ALKPHOS 54 59  --   BILITOT 0.8 0.8  --   PROT 6.2* 5.9*  --   ALBUMIN 3.4* 3.1*  --   PLT 194 199 196   Hypoalbuminemia In the setting of acute infection.   Tobacco use Tobacco cessation advised. Nicotine replacement therapy as needed.    Mobility: Encourage ambulation  Goals of care   Code Status: Full Code     DVT prophylaxis:     Antimicrobials: IV Rocephin Fluid: None Consultants: Orthopedics Family Communication: None at bedside  Status: Inpatient Level of care:  Med-Surg   Patient is from: Home Needs to continue in-hospital care: Pending workup and treatment Anticipated d/c to: Likely home ultimately    Diet:  Diet Order             Diet NPO time specified  Diet effective midnight                   Scheduled Meds:  povidone-iodine   Topical Once   povidone-iodine  2 Application Topical Once    PRN meds: [MAR Hold] acetaminophen **OR** [MAR Hold] acetaminophen, [MAR Hold] hydrOXYzine, [MAR Hold] ketorolac, [MAR Hold] ondansetron **OR** [MAR Hold] ondansetron (ZOFRAN) IV, [MAR Hold] oxyCODONE   Infusions:    ceFAZolin (ANCEF) IV     [  MAR Hold] cefTRIAXone (ROCEPHIN)  IV 2 g (06/13/23 1058)   tranexamic acid      Antimicrobials: Anti-infectives (From admission, onward)    Start     Dose/Rate Route Frequency Ordered Stop   06/13/23 1400  ceFAZolin (ANCEF) IVPB 2g/100 mL premix        2 g 200 mL/hr over 30 Minutes Intravenous On call to O.R. 06/13/23 1347 06/14/23 0559   06/12/23 1000  [MAR Hold]  cefTRIAXone (ROCEPHIN) 2 g in sodium chloride 0.9 % 100 mL IVPB        (MAR Hold since Mon 06/13/2023 at  1328.Hold Reason: Transfer to a Procedural area)   2 g 200 mL/hr over 30 Minutes Intravenous Every 24 hours 06/11/23 1700 06/19/23 0959   06/11/23 1615  cefTRIAXone (ROCEPHIN) 2 g in sodium chloride 0.9 % 100 mL IVPB        2 g 200 mL/hr over 30 Minutes Intravenous  Once 06/11/23 1608 06/11/23 1725       Nutritional status:  Body mass index is 28.35 kg/m.          Objective: Vitals:   06/13/23 1347 06/13/23 1350  BP: (!) 136/105 (!) 140/104  Pulse: 64   Resp: 17   Temp: 97.9 F (36.6 C)   SpO2: 97%     Intake/Output Summary (Last 24 hours) at 06/13/2023 1353 Last data filed at 06/13/2023 0600 Gross per 24 hour  Intake 220 ml  Output --  Net 220 ml   Filed Weights   06/11/23 1800  Weight: 94.8 kg   Weight change:  Body mass index is 28.35 kg/m.   Physical Exam: General exam: Pleasant, Skin: No rashes, lesions or ulcers. HEENT: Atraumatic, normocephalic, no obvious bleeding Lungs: Clear to auscultation bilaterally CVS: Regular rate and rhythm, no murmur GI/Abd soft, nontender, nondistended, bowel sound present CNS: Alert, awake, oriented x 3 Psychiatry: Mood appropriate Extremities: No pedal edema, no calf tenderness.  Continues to have left elbow swelling extending proximally and distally  Data Review: I have personally reviewed the laboratory data and studies available.  F/u labs ordered Unresulted Labs (From admission, onward)     Start     Ordered   06/14/23 0500  CBC with Differential/Platelet  Daily,   R      06/13/23 1353   06/14/23 0500  Basic metabolic panel  Daily,   R      06/13/23 1353            Total time spent in review of labs and imaging, patient evaluation, formulation of plan, documentation and communication with family: 45 minutes  Signed, Lorin Glass, MD [facility] 06/13/2023

## 2023-06-13 NOTE — Progress Notes (Signed)
       Overnight   NAME: Keith Swanson MRN: [id number] DOB : 1976/06/20    Date of Service   06/13/2023   HPI/Events of Note    Notified by Surgery for follow up considerations for CT in progress.  ========= 47 year old male with past medical history of anaphylaxis, asthma, bronchitis, tobacco use, EtOH use in remission-stating currently drinks occasionally on weekends.  Originally admitted through the ER with complaint of left elbow pain, swelling or redness and decreased range of movement.  Requested follow-up for imaging of swelling-facial area/jaw.  No compromise to airway currently. Patient is currently on antibiotics.  =====================================================  Findings in part   " FINDINGS: Osseous: No fracture or mandibular dislocation. No destructive process.   Orbits: Negative. No traumatic or inflammatory finding.   Sinuses: Mild maxillary and ethmoid sinus mucosal thickening. Retained secretions in the sphenoid sinus.   Soft tissues: No sialolithiasis. No focal abnormality of the left facial soft tissues. No abscess or fluid collection.   Limited intracranial: No significant or unexpected finding.   IMPRESSION: 1. No focal abnormality of the left facial soft tissues. No abscess or fluid collection. 2. Mild paranasal sinus disease.     Electronically Signed   By: Deatra Robinson M.D.   On: 06/13/2023 22:47  "    Interventions/ Plan   Continue all previous orders. Continue monitoring of airway, voice. Notify for any increasing swelling, change of voice, itching, redness/hives.      Chinita Greenland BSN MSNA MSN ACNPC-AG Acute Care Nurse Practitioner [facility]

## 2023-06-13 NOTE — Anesthesia Procedure Notes (Signed)
Procedure Name: Intubation Date/Time: 06/13/2023 5:57 PM  Performed by: Deri Fuelling, CRNAPre-anesthesia Checklist: Patient identified, Emergency Drugs available, Suction available and Patient being monitored Patient Re-evaluated:Patient Re-evaluated prior to induction Oxygen Delivery Method: Circle system utilized Preoxygenation: Pre-oxygenation with 100% oxygen Induction Type: IV induction Ventilation: Mask ventilation without difficulty Laryngoscope Size: Mac and 4 Grade View: Grade I Tube type: Oral Number of attempts: 1 Airway Equipment and Method: Stylet and Oral airway Placement Confirmation: ETT inserted through vocal cords under direct vision, positive ETCO2 and breath sounds checked- equal and bilateral Secured at: 22 cm Tube secured with: Tape Dental Injury: Teeth and Oropharynx as per pre-operative assessment

## 2023-06-13 NOTE — Brief Op Note (Signed)
   06/13/2023  7:11 PM  PATIENT:  Keith Swanson  47 y.o. 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  PROCEDURE:  Procedure(s): Arthrotomy, IRRIGATION AND DEBRIDEMENT ELBOW, Bursa and Joint  SURGEON:  Surgeon(s): August Saucer, Corrie Mckusick, MD  ASSISTANT: Aundra Millet PA  ANESTHESIA:   General  EBL: 75 ml    Total I/O In: 1000 [I.V.:1000] Out: -   BLOOD ADMINISTERED: none  DRAINS: Penrose drain in the left elbow joint, flat Jackson drain in the bursa    LOCAL MEDICATIONS USED: 30 cc plain Marcaine  SPECIMEN: Cultures x 2 from bursa culture x 1 from elbow joint   COUNTS:  YES  TOURNIQUET:  * No tourniquets in log *  DICTATION: .Other Dictation: Dictation Number [id number]  PLAN OF CARE: Admit to inpatient   PATIENT DISPOSITION:  PACU - hemodynamically stable

## 2023-06-13 NOTE — Op Note (Unsigned)
NAMELANKFORD, PARI MEDICAL RECORD NO: [id number] ACCOUNT NO: [id number] DATE OF BIRTH: 25-Jan-1976 FACILITY: [facility] LOCATION: [facility] PHYSICIAN: Graylin Shiver. August Saucer, MD  Operative Report   DATE OF PROCEDURE: 06/13/2023  PREOPERATIVE DIAGNOSIS:  Left elbow infected olecranon bursitis and infected joint.  POSTOPERATIVE DIAGNOSIS:  Left elbow infected olecranon bursitis and infected joint.  PROCEDURE:  Left elbow olecranon bursa excision with excisional debridement as well as arthrotomy of the elbow with irrigation and washout.  SURGEON:  Graylin Shiver. August Saucer, MD  ASSISTANT:  Karenann Cai.  INDICATIONS:  This is a 47 year old patient with left elbow pain of several days' duration.  He presents for operative management after explanation of risks and benefits.  DESCRIPTION OF PROCEDURE:  The patient was brought to the operating room where general anesthetic was induced.  Perioperative IV antibiotics were continued.  The left elbow was prescrubbed with alcohol and Betadine, allowed to air dry.  Prepped with  DuraPrep solution and draped in sterile manner.  Timeout was called.  Incision made initially about 4 cm proximal and distal to the olecranon tip.  There was some fluid present, which was somewhat cloudy, but not overtly purulent.  Cultures x2 were  obtained from this fluid.  The incision was extended proximally and distally.  There was some edema, superficial to the fascial planes.  No discrete abscesses were identified as we extended the incision about 10 cm from the olecranon tip distally and  about 6 cm proximally.  Olecranon bursa was excised with care being taken to avoid injury to the ulnar nerve, bleeding points encountered controlled using electrocautery.  Scraping and curettage was performed.  All in all, the bursal cavity was about the  size of a golf ball.  This was removed.  Further soft tissue dissection was performed.  Muscle contracted nicely both above and below the elbow joint.  At this  time, an arthrotomy was made just above the equator of the lateral epicondyle.  Skin and  subcutaneous tissue were sharply divided.  Fluid was encountered in the joint as well and this was under some degree of pressure.  Similar fluid was present in the bursal cavity.  This was irrigated with about 500 mL of irrigating solution on Asepto.  We  next irrigated the bursa with 3 liters of pulsatile irrigation.  Next, a flat Jackson drain was placed in the bursa and a Penrose drain was placed into the joint.  These were brought out through the skin.  Next, the incision was closed over vancomycin,  which was placed on the fascia using 2-0 nylon suture.  These were spaced about a 1 cm apart to allow for drainage.  Well padded posterior splint applied.  The patient tolerated the procedure well without immediate complication and transferred to the  recovery room in stable condition.  Luke's assistance was required for opening, closing, mobilization of tissue.  His assistance was a medical necessity.   PUS D: 06/13/2023 7:23:53 pm T: 06/13/2023 8:43:00 pm  JOB: [id number]/ [id number]

## 2023-06-13 NOTE — Plan of Care (Signed)
Problem: Education: Goal: Knowledge of General Education information will improve Description: Including pain rating scale, medication(s)/side effects and non-pharmacologic comfort measures Outcome: Progressing   Problem: Pain Managment: Goal: General experience of comfort will improve Outcome: Progressing   Problem: Safety: Goal: Ability to remain free from injury will improve Outcome: Progressing   Haydee Salter, RN 06/13/23 1:58 PM

## 2023-06-13 NOTE — Transfer of Care (Signed)
Immediate Anesthesia Transfer of Care Note  Patient: Keith Swanson  Procedure(s) Performed: IRRIGATION AND DEBRIDEMENT ELBOW, Bursa and Joint (Left)  Patient Location: PACU  Anesthesia Type:General  Level of Consciousness: drowsy and patient cooperative  Airway & Oxygen Therapy: Patient Spontanous Breathing and Patient connected to face mask oxygen  Post-op Assessment: Report given to RN and Post -op Vital signs reviewed and stable  Post vital signs: Reviewed and stable  Last Vitals:  Vitals Value Taken Time  BP 146/102 06/13/23 1918  Temp    Pulse 86 06/13/23 1923  Resp 20 06/13/23 1923  SpO2 100 % 06/13/23 1923  Vitals shown include unfiled device data.  Last Pain:  Vitals:   06/13/23 1347  TempSrc: Oral  PainSc:       Patients Stated Pain Goal: 0 (06/13/23 1058)  Complications: No notable events documented.

## 2023-06-13 NOTE — Anesthesia Preprocedure Evaluation (Addendum)
Anesthesia Evaluation  Patient identified by MRN, date of birth, ID band Patient awake    Reviewed: Allergy & Precautions, NPO status , Patient's Chart, lab work & pertinent test results  Airway Mallampati: II  TM Distance: >3 FB Neck ROM: Full    Dental no notable dental hx. (+) Teeth Intact, Dental Advisory Given   Pulmonary asthma , Current SmokerPatient did not abstain from smoking., former smoker   Pulmonary exam normal breath sounds clear to auscultation       Cardiovascular negative cardio ROS Normal cardiovascular exam Rhythm:Regular Rate:Normal     Neuro/Psych  PSYCHIATRIC DISORDERS Anxiety     negative neurological ROS     GI/Hepatic negative GI ROS, Neg liver ROS,,,  Endo/Other  negative endocrine ROS    Renal/GU negative Renal ROSLab Results      Component                Value               Date                       K                        3.8                 06/13/2023                negative genitourinary   Musculoskeletal  (+) Arthritis , Osteoarthritis,    Abdominal   Peds  Hematology negative hematology ROS (+)   Hb 13.2, plt 196   Anesthesia Other Findings All: PCN, Doxy  Reproductive/Obstetrics negative OB ROS                              Anesthesia Physical Anesthesia Plan  ASA: 2  Anesthesia Plan: General   Post-op Pain Management: Tylenol PO (pre-op)*, Toradol IV (intra-op)* and Precedex   Induction: Intravenous  PONV Risk Score and Plan: 2 and Ondansetron, Dexamethasone, Midazolam and Treatment may vary due to age or medical condition  Airway Management Planned: Oral ETT  Additional Equipment: None  Intra-op Plan:   Post-operative Plan: Extubation in OR  Informed Consent: I have reviewed the patients History and Physical, chart, labs and discussed the procedure including the risks, benefits and alternatives for the proposed anesthesia with the  patient or authorized representative who has indicated his/her understanding and acceptance.     Dental advisory given  Plan Discussed with: CRNA  Anesthesia Plan Comments:         Anesthesia Quick Evaluation

## 2023-06-14 ENCOUNTER — Encounter (HOSPITAL_COMMUNITY): Payer: Self-pay | Admitting: Orthopedic Surgery

## 2023-06-14 DIAGNOSIS — L03114 Cellulitis of left upper limb: Secondary | ICD-10-CM | POA: Diagnosis not present

## 2023-06-14 LAB — CBC WITH DIFFERENTIAL/PLATELET
Abs Immature Granulocytes: 0.09 10*3/uL — ABNORMAL HIGH (ref 0.00–0.07)
Basophils Absolute: 0 10*3/uL (ref 0.0–0.1)
Basophils Relative: 0 %
Eosinophils Absolute: 0 10*3/uL (ref 0.0–0.5)
Eosinophils Relative: 0 %
HCT: 40.6 % (ref 39.0–52.0)
Hemoglobin: 13.7 g/dL (ref 13.0–17.0)
Immature Granulocytes: 1 %
Lymphocytes Relative: 7 %
Lymphs Abs: 0.9 10*3/uL (ref 0.7–4.0)
MCH: 30 pg (ref 26.0–34.0)
MCHC: 33.7 g/dL (ref 30.0–36.0)
MCV: 88.8 fL (ref 80.0–100.0)
Monocytes Absolute: 0.3 10*3/uL (ref 0.1–1.0)
Monocytes Relative: 2 %
Neutro Abs: 12.7 10*3/uL — ABNORMAL HIGH (ref 1.7–7.7)
Neutrophils Relative %: 90 %
Platelets: 206 10*3/uL (ref 150–400)
RBC: 4.57 MIL/uL (ref 4.22–5.81)
RDW: 12.8 % (ref 11.5–15.5)
WBC: 14.1 10*3/uL — ABNORMAL HIGH (ref 4.0–10.5)
nRBC: 0 % (ref 0.0–0.2)

## 2023-06-14 LAB — BASIC METABOLIC PANEL
Anion gap: 10 (ref 5–15)
BUN: 15 mg/dL (ref 6–20)
CO2: 24 mmol/L (ref 22–32)
Calcium: 8.7 mg/dL — ABNORMAL LOW (ref 8.9–10.3)
Chloride: 102 mmol/L (ref 98–111)
Creatinine, Ser: 0.94 mg/dL (ref 0.61–1.24)
GFR, Estimated: >60 mL/min (ref >60)
Glucose, Bld: 221 mg/dL — ABNORMAL HIGH (ref 70–99)
Potassium: 4.4 mmol/L (ref 3.5–5.1)
Sodium: 136 mmol/L (ref 135–145)

## 2023-06-14 MED ORDER — METHOCARBAMOL 1000 MG/10ML IJ SOLN: 500.0000 mg | Freq: Three times a day (TID) | INTRAVENOUS | Status: DC

## 2023-06-14 MED ORDER — METHOCARBAMOL 500 MG PO TABS
500.0000 mg | ORAL_TABLET | Freq: Three times a day (TID) | ORAL | Status: DC
  Administered 2023-06-14 – 2023-06-18 (×14): 500 mg via ORAL
  Filled 2023-06-14 (×14): qty 1

## 2023-06-14 MED ORDER — ORAL CARE MOUTH RINSE: 15.0000 mL | OROMUCOSAL | Status: DC | PRN

## 2023-06-14 MED ORDER — MELATONIN 3 MG PO TABS
3.0000 mg | ORAL_TABLET | Freq: Every day | ORAL | Status: DC
  Administered 2023-06-14 – 2023-06-17 (×4): 3 mg via ORAL
  Filled 2023-06-14 (×4): qty 1

## 2023-06-14 MED ORDER — PANTOPRAZOLE SODIUM 40 MG PO TBEC
40.0000 mg | DELAYED_RELEASE_TABLET | Freq: Every day | ORAL | Status: DC
  Administered 2023-06-14 – 2023-06-17 (×4): 40 mg via ORAL
  Filled 2023-06-14 (×4): qty 1

## 2023-06-14 MED ORDER — HYDROXYZINE HCL 25 MG PO TABS
50.0000 mg | ORAL_TABLET | Freq: Two times a day (BID) | ORAL | Status: DC | PRN
  Administered 2023-06-14 – 2023-06-15 (×2): 50 mg via ORAL
  Filled 2023-06-14 (×2): qty 2

## 2023-06-14 MED ORDER — METHOCARBAMOL 1000 MG/10ML IJ SOLN: 500.0000 mg | Freq: Three times a day (TID) | INTRAVENOUS | Status: DC | PRN

## 2023-06-14 MED ORDER — METHOCARBAMOL 500 MG PO TABS: 500.0000 mg | ORAL_TABLET | Freq: Three times a day (TID) | ORAL | Status: DC

## 2023-06-14 MED ORDER — AMLODIPINE BESYLATE 5 MG PO TABS
5.0000 mg | ORAL_TABLET | Freq: Every day | ORAL | Status: DC
  Administered 2023-06-14 – 2023-06-15 (×2): 5 mg via ORAL
  Filled 2023-06-14 (×2): qty 1

## 2023-06-14 NOTE — Anesthesia Postprocedure Evaluation (Signed)
Anesthesia Post Note  Patient: Bronx Mcindoe  Procedure(s) Performed: IRRIGATION AND DEBRIDEMENT ELBOW, Bursa and Joint (Left)     Patient location during evaluation: PACU Anesthesia Type: General Level of consciousness: awake and alert Pain management: pain level controlled Vital Signs Assessment: post-procedure vital signs reviewed and stable Respiratory status: spontaneous breathing, nonlabored ventilation, respiratory function stable and patient connected to nasal cannula oxygen Cardiovascular status: blood pressure returned to baseline and stable Postop Assessment: no apparent nausea or vomiting Anesthetic complications: no   No notable events documented.  Last Vitals:  Vitals:   06/14/23 0143 06/14/23 0532  BP: (!) 160/85 (!) 147/83  Pulse: 94 82  Resp: 18 16  Temp: 36.6 C 36.9 C  SpO2: 95% 96%    Last Pain:  Vitals:   06/14/23 0343  TempSrc:   PainSc: Asleep                 Mariann Barter

## 2023-06-14 NOTE — Progress Notes (Signed)
  Subjective: Patient is a 47 year old male who presents POD 1 s/p left elbow irrigation and debridement with olecranon bursectomy for septic arthritis and septic bursitis.  Doing well with pain mostly controlled.  No fevers or chills.  Has had some nausea which he feels is reflux related.   Objective: Vital signs in last 24 hours: Temp:  [97.7 F (36.5 C)-98.7 F (37.1 C)] 98.5 F (36.9 C) (09/10 1245) Pulse Rate:  [76-94] 87 (09/10 1245) Resp:  [14-20] 20 (09/10 1245) BP: (143-175)/(83-119) 152/96 (09/10 1245) SpO2:  [95 %-100 %] 100 % (09/10 1245)  Intake/Output from previous day: 09/09 0701 - 09/10 0700 In: 2986.8 [P.O.:1440; I.V.:1346.8; IV Piggyback:100] Out: 715 [Urine:600; Drains:65; Blood:50] Intake/Output this shift: Total I/O In: 100 [IV Piggyback:100] Out: -   Exam:  Ortho exam demonstrates intact EPL, FPL, finger abduction.  JP drain and Penrose drains in place.  These were removed today at bedside.  There is serosanguineous drainage of about 5 cc in the JP drain bulb.  Labs: Recent Labs    06/12/23 0258 06/13/23 0259 06/14/23 0333  HGB 14.2 13.2 13.7   Recent Labs    06/13/23 0259 06/14/23 0333  WBC 11.4* 14.1*  RBC 4.50 4.57  HCT 39.4 40.6  PLT 196 206   Recent Labs    06/13/23 0259 06/14/23 0333  NA 135 136  K 3.8 4.4  CL 102 102  CO2 25 24  BUN 16 15  CREATININE 0.91 0.94  GLUCOSE 117* 221*  CALCIUM 8.3* 8.7*   No results for input(s): "LABPT", "INR" in the last 72 hours.  Assessment/Plan: Patient is POD 1 s/p left elbow I&D.  - Cultures not returned yet.  On Rocephin.  Appreciate hospitalist management. -Facial swelling improved and CT negative from yesterday. - Nonweightbearing to operative extremity.  Remain in sling. - Follow-up with Dr. August Saucer in clinic 10 days postoperatively   [facility] 06/14/2023, 3:29 PM

## 2023-06-14 NOTE — Plan of Care (Signed)
  Problem: Activity: Goal: Risk for activity intolerance will decrease Outcome: Progressing   Problem: Nutrition: Goal: Adequate nutrition will be maintained Outcome: Progressing   Problem: Pain Managment: Goal: General experience of comfort will improve Outcome: Progressing   Problem: Safety: Goal: Ability to remain free from injury will improve Outcome: Progressing   

## 2023-06-14 NOTE — Progress Notes (Addendum)
PROGRESS NOTE  Keith Swanson  DOB: 1975/12/06  PCP: Aviva Kluver WUJ:[id number]  DOA: 06/11/2023  LOS: 3 days  Hospital Day: 4  Brief narrative: Keith Swanson is a 47 y.o. male with PMH significant for anaphylaxis, asthma, bronchitis, tobacco use, alcohol use in remission and currently drinks occasionally only on weekends. 9/7, patient presented to the ED with complaint of left elbow pain, swelling and redness and decreased range of movement. Patient reports she went to donate plasma 2 days ago and had problems with needle and had to switch sides.  He developed symptoms since then.  In the ED, patient was hemodynamically stable Labs with WBC count of 10.6, AST/ALT mildly elevated to 42/57 Left elbow x-ray did not show any no acute fracture or dislocation.  It showed a metallic 7 mm density overlying the dorsal soft tissues of the proximal ulna.   Recommend correlation with any history of remote trauma.  Soft tissue edema overlying the olecranon is no nonspecific but can be seen in the setting of olecranon bursitis. Blood culture was sent Patient was given IV Rocephin Admitted to [facility] Orthopedics was consulted 9/9, patient underwent left elbow olecranon bursa excisional debridement as well as atherectomy of the elbow with irrigation and washout.  Subjective: Patient was seen and examined this morning. Lying on bed.  Complains of a rough night because of pain Family at bedside.  Assessment and plan: Left elbow cellulitis Possible septic arthritis/olecranon bursitis Presented with left elbow cellulitis.  Initial CT scan did not show any evidence of abscess. Because of significant swelling and limitation in range of mobility, orthopedics was consulted 9/8 MRI left elbow showed possibility of left septic arthritis and electroretinogram bursitis. 9/9, patient underwent left elbow olecranon bursa excisional debridement as well as atherectomy of the elbow with irrigation and washout. Was given  prophylactic IV vancomycin. Currently on IV Rocephin Follow-up blood culture and OR wound culture report. Pain management with scheduled Tylenol, scheduled Celebrex, as needed Dilaudid and as needed oxycodone.   Recent Labs  Lab 06/11/23 1434 06/12/23 0258 06/13/23 0259 06/14/23 0333  WBC 10.6* 12.2* 11.4* 14.1*   Elevated blood pressure No history of hypertension.  Blood pressure persistently elevated now.  May be related to pain.  Or may have undiagnosed hypertension in the past Started on amlodipine 5 mg daily today.  Continue to monitor.  IV hydralazine as needed.  Mild intermittent asthma Bronchodilators as needed.  Transaminitis Alcoholism RUQ ultrasound with no acute findings of gallstones or cholecystitis. Continue monitor LFTs Recommend alcohol cessation Recent Labs  Lab 06/11/23 1434 06/12/23 0258 06/13/23 0259 06/14/23 0333  AST 42* 34  --   --   ALT 57* 48*  --   --   ALKPHOS 54 59  --   --   BILITOT 0.8 0.8  --   --   PROT 6.2* 5.9*  --   --   ALBUMIN 3.4* 3.1*  --   --   PLT 194 199 196 206   Hypoalbuminemia In the setting of acute infection.   Tobacco use Tobacco cessation advised. Nicotine replacement therapy as needed.    Mobility: Encourage ambulation  Goals of care   Code Status: Full Code     DVT prophylaxis:  SCDs Start: 06/13/23 2029   Antimicrobials: IV Rocephin Fluid: None Consultants: Orthopedics Family Communication: None at bedside  Status: Inpatient Level of care:  Med-Surg   Patient is from: Home Needs to continue in-hospital care: POD 1,  Anticipated d/c to: Likely  home ultimately    Diet:  Diet Order             Diet regular Room service appropriate? Yes; Fluid consistency: Thin  Diet effective now                   Scheduled Meds:  acetaminophen  1,000 mg Oral Q6H   amLODipine  5 mg Oral Daily   celecoxib  100 mg Oral BID   docusate sodium  100 mg Oral BID   melatonin  3 mg Oral QHS    methocarbamol  500 mg Oral TID   pantoprazole  40 mg Oral Daily    PRN meds: acetaminophen, hydrALAZINE, HYDROmorphone (DILAUDID) injection, hydrOXYzine, methocarbamol (ROBAXIN) IV, metoCLOPramide **OR** metoCLOPramide (REGLAN) injection, ondansetron **OR** ondansetron (ZOFRAN) IV, ondansetron **OR** ondansetron (ZOFRAN) IV, oxyCODONE, oxyCODONE   Infusions:   cefTRIAXone (ROCEPHIN)  IV 2 g (06/14/23 0857)   methocarbamol (ROBAXIN) IV      Antimicrobials: Anti-infectives (From admission, onward)    Start     Dose/Rate Route Frequency Ordered Stop   06/13/23 2030  vancomycin (VANCOCIN) IVPB 1000 mg/200 mL premix        1,000 mg 200 mL/hr over 60 Minutes Intravenous Every 12 hours 06/13/23 2028 06/13/23 2323   06/13/23 1852  vancomycin (VANCOCIN) powder  Status:  Discontinued          As needed 06/13/23 1853 06/13/23 2045   06/13/23 1400  ceFAZolin (ANCEF) IVPB 2g/100 mL premix        2 g 200 mL/hr over 30 Minutes Intravenous On call to O.R. 06/13/23 1347 06/13/23 1824   06/12/23 1000  cefTRIAXone (ROCEPHIN) 2 g in sodium chloride 0.9 % 100 mL IVPB        2 g 200 mL/hr over 30 Minutes Intravenous Every 24 hours 06/11/23 1700 06/19/23 0959   06/11/23 1615  cefTRIAXone (ROCEPHIN) 2 g in sodium chloride 0.9 % 100 mL IVPB        2 g 200 mL/hr over 30 Minutes Intravenous  Once 06/11/23 1608 06/11/23 1725       Nutritional status:  Body mass index is 28.35 kg/m.          Objective: Vitals:   06/14/23 0958 06/14/23 1245  BP: (!) 164/94 (!) 152/96  Pulse: 93 87  Resp: 18 20  Temp: 98.4 F (36.9 C) 98.5 F (36.9 C)  SpO2: 99% 100%    Intake/Output Summary (Last 24 hours) at 06/14/2023 1405 Last data filed at 06/14/2023 0600 Gross per 24 hour  Intake 2986.82 ml  Output 715 ml  Net 2271.82 ml   Filed Weights   06/11/23 1800 06/13/23 1359  Weight: 94.8 kg 94.8 kg   Weight change:  Body mass index is 28.35 kg/m.   Physical Exam: General exam: Pleasant, Skin: No  rashes, lesions or ulcers. HEENT: Atraumatic, normocephalic, no obvious bleeding Lungs: Clear to auscultation bilaterally CVS: Regular rate and rhythm, no murmur GI/Abd soft, nontender, nondistended, bowel sound present CNS: Alert, awake, oriented x 3 Psychiatry: Mood appropriate Extremities: No pedal edema, no calf tenderness.  Postsurgical status of left elbow.  Data Review: I have personally reviewed the laboratory data and studies available.  F/u labs ordered Unresulted Labs (From admission, onward)     Start     Ordered   06/14/23 0500  CBC with Differential/Platelet  Daily,   R      06/13/23 1353   06/14/23 0500  Basic metabolic panel  Daily,  R      06/13/23 1353            Total time spent in review of labs and imaging, patient evaluation, formulation of plan, documentation and communication with family: 45 minutes  Signed, Lorin Glass, MD [facility] 06/14/2023

## 2023-06-15 DIAGNOSIS — J4521 Mild intermittent asthma with (acute) exacerbation: Secondary | ICD-10-CM | POA: Diagnosis not present

## 2023-06-15 DIAGNOSIS — M009 Pyogenic arthritis, unspecified: Secondary | ICD-10-CM | POA: Diagnosis not present

## 2023-06-15 DIAGNOSIS — L03114 Cellulitis of left upper limb: Secondary | ICD-10-CM | POA: Diagnosis not present

## 2023-06-15 LAB — CBC WITH DIFFERENTIAL/PLATELET
Abs Immature Granulocytes: 0.16 10*3/uL — ABNORMAL HIGH (ref 0.00–0.07)
Basophils Absolute: 0.1 10*3/uL (ref 0.0–0.1)
Basophils Relative: 0 %
Eosinophils Absolute: 0.1 10*3/uL (ref 0.0–0.5)
Eosinophils Relative: 1 %
HCT: 36.9 % — ABNORMAL LOW (ref 39.0–52.0)
Hemoglobin: 12.2 g/dL — ABNORMAL LOW (ref 13.0–17.0)
Immature Granulocytes: 1 %
Lymphocytes Relative: 16 %
Lymphs Abs: 2.5 10*3/uL (ref 0.7–4.0)
MCH: 29.5 pg (ref 26.0–34.0)
MCHC: 33.1 g/dL (ref 30.0–36.0)
MCV: 89.3 fL (ref 80.0–100.0)
Monocytes Absolute: 1.5 10*3/uL — ABNORMAL HIGH (ref 0.1–1.0)
Monocytes Relative: 9 %
Neutro Abs: 11.9 10*3/uL — ABNORMAL HIGH (ref 1.7–7.7)
Neutrophils Relative %: 73 %
Platelets: 196 10*3/uL (ref 150–400)
RBC: 4.13 MIL/uL — ABNORMAL LOW (ref 4.22–5.81)
RDW: 12.9 % (ref 11.5–15.5)
WBC: 16.2 10*3/uL — ABNORMAL HIGH (ref 4.0–10.5)
nRBC: 0 % (ref 0.0–0.2)

## 2023-06-15 LAB — BASIC METABOLIC PANEL
Anion gap: 9 (ref 5–15)
BUN: 13 mg/dL (ref 6–20)
CO2: 23 mmol/L (ref 22–32)
Calcium: 8.3 mg/dL — ABNORMAL LOW (ref 8.9–10.3)
Chloride: 104 mmol/L (ref 98–111)
Creatinine, Ser: 0.81 mg/dL (ref 0.61–1.24)
GFR, Estimated: >60 mL/min (ref >60)
Glucose, Bld: 109 mg/dL — ABNORMAL HIGH (ref 70–99)
Potassium: 3.8 mmol/L (ref 3.5–5.1)
Sodium: 136 mmol/L (ref 135–145)

## 2023-06-15 NOTE — Progress Notes (Signed)
   06/15/23 1336  TOC Brief Assessment  Insurance and Status Reviewed  Patient has primary care physician Yes  Home environment has been reviewed home  Prior level of function: independent  Prior/Current Home Services No current home services  Social Determinants of Health Reivew SDOH reviewed no interventions necessary  Readmission risk has been reviewed Yes  Transition of care needs no transition of care needs at this time    Have updated PCP information per pt request.  No further needs at this time.

## 2023-06-15 NOTE — Progress Notes (Signed)
  Progress Note   Patient: Keith Swanson ZOX:[id number] DOB: [birth year] DOA: 06/11/2023     4 DOS: the patient was seen and examined on 06/15/2023   Brief hospital course: 47 year old man presented with left elbow pain.  Admitted for cellulitis of the left elbow.  Seen by orthopedics and underwent surgery as below.  Consultants Orthopedics   Procedures 9/9 Left elbow olecranon bursa excision with excisional debridement as well as arthrotomy of the elbow with irrigation and washout.   Assessment and Plan: Left elbow cellulitis Septic arthritis of the olecranon joint Presented with left elbow cellulitis.  Initial CT scan did not show any evidence of abscess. Because of significant swelling and limitation in range of mobility, orthopedics was consulted 9/8 MRI left elbow showed possibility of left septic arthritis and olecranon bursitis. 9/9, patient underwent left elbow olecranon bursa excisional debridement as well as atherectomy of the elbow with irrigation and washout. Clinically improving.  On empiric antibiotics.  Follow culture data. Nonweightbearing to operative extremity.  Remain in sling. Follow-up with Dr. August Saucer in clinic 10 days postoperatively  Elevated blood pressure Appears to be resolved.  Probably related to acute infection and pain.   Mild intermittent asthma Stable.  Bronchodilators as needed.   Transaminitis Alcoholism RUQ ultrasound with no acute findings of gallstones or cholecystitis. Continue monitor LFTs Recommend alcohol cessation  Hypoalbuminemia In the setting of acute infection.   Tobacco use Tobacco cessation advised. Nicotine replacement therapy as needed.    Subjective:  Feels ok Some pain left arm  Physical Exam: Vitals:   06/14/23 0958 06/14/23 1245 06/15/23 0537 06/15/23 1345  BP: (!) 164/94 (!) 152/96 125/88 (!) 133/91  Pulse: 93 87 69 79  Resp: 18 20 16 17   Temp: 98.4 F (36.9 C) 98.5 F (36.9 C) 98.1 F (36.7 C) 97.8 F (36.6 C)   TempSrc:  Oral Oral   SpO2: 99% 100% 99% 99%  Weight:      Height:       Physical Exam Vitals reviewed.  Constitutional:      General: He is not in acute distress.    Appearance: He is not ill-appearing or toxic-appearing.  Cardiovascular:     Rate and Rhythm: Normal rate and regular rhythm.     Heart sounds: No murmur heard. Pulmonary:     Effort: Pulmonary effort is normal. No respiratory distress.     Breath sounds: No wheezing, rhonchi or rales.  Musculoskeletal:     Comments: Left arm in splint, wrapped Moves fingers  Neurological:     Mental Status: He is alert.  Psychiatric:        Mood and Affect: Mood normal.        Behavior: Behavior normal.     Data Reviewed: BMP noted WBC upt to 16.2 Wound cultures pending  Family Communication: caregiver at bedside  Disposition: Status is: Inpatient Remains inpatient appropriate because: septic arthritis     Time spent: 20 minutes  Author: Brendia Sacks, MD 06/15/2023 6:35 PM  For on call review [email or web address].

## 2023-06-15 NOTE — Hospital Course (Addendum)
HPI: Keith Swanson is a 47 y.o. male with medical history significant of anaphylaxis, asthma, bronchitis, mild tobacco use, alcohol abuse in remission but occasionally has 1 or 2 drinks during the weekend, history of lower back pain who is presenting to the emergency department complaints of left elbow pain, erythema, calor and edema.  The pain and the decrease in ROM mating come to the emergency department.  He stated he has been having some tingling in his left hand.  He has felt mildly febrile and had a night sweat last night.  The patient stated that he went to donate plasma 2 days ago and they have problems with the needle and had to switch sides. He denied rhinorrhea, sore throat, wheezing or hemoptysis.  No chest pain, palpitations, diaphoresis, PND, orthopnea or pitting edema of the lower extremities.  No abdominal pain, nausea, emesis, diarrhea, constipation, melena or hematochezia.  No flank pain, dysuria, frequency or hematuria.  No polyuria, polydipsia, polyphagia or blurred vision.    Lab work: His CBC showed a white count of 10.6, hemoglobin 13.9 g/dL platelets 253.  CMP with normal electrolytes after calcium correction, normal glucose and renal function.  Total protein 6.2 and albumin 3.4 g/dL.  AST 42 and ALT 57 units/L.  Normal alk phos and total bilirubin.   Imaging: Left elbow x-ray with no acute fracture or dislocation.  There is a metallic 7 mm density overlying the dorsal soft tissues of the proximal ulna.  Recommend correlation with any history of remote trauma.  Soft tissue edema overlying the olecranon is no nonspecific but can be seen in the setting of olecranon bursitis.  Significant Events: Admitted 06/11/2023   Significant Labs: Blood and intra-operative cultures are negative  Significant Imaging Studies: MRI left elbow shows: 1. Prominent soft tissue swelling and fluid at the posterior aspect of the elbow and visualized left arm. There is a more well-defined fluid collection  posterior to the olecranon process measuring approximately 4.2 x 1.2 x 3.4 cm. appearance favors cellulitis with olecranon bursitis, which may be septic. 2. Small elbow joint effusion. This is nonspecific and could be reactive. Septic arthritis is not entirely excluded. 3. No evidence of osteomyelitis.  Antibiotic Therapy: Anti-infectives (From admission, onward)    Start     Dose/Rate Route Frequency Ordered Stop   06/17/23 1530  DAPTOmycin (CUBICIN) 750 mg in sodium chloride 0.9 % IVPB        8 mg/kg  94.8 kg 130 mL/hr over 30 Minutes Intravenous Daily 06/17/23 1444     06/17/23 0000  daptomycin (CUBICIN) IVPB        750 mg Intravenous Every 24 hours 06/17/23 1452 07/25/23 2359   06/17/23 0000  cefTRIAXone (ROCEPHIN) IVPB        2 g Intravenous Every 24 hours 06/17/23 1452 07/25/23 2359   06/13/23 2030  vancomycin (VANCOCIN) IVPB 1000 mg/200 mL premix        1,000 mg 200 mL/hr over 60 Minutes Intravenous Every 12 hours 06/13/23 2028 06/13/23 2323   06/13/23 1852  vancomycin (VANCOCIN) powder  Status:  Discontinued          As needed 06/13/23 1853 06/13/23 2045   06/13/23 1400  ceFAZolin (ANCEF) IVPB 2g/100 mL premix        2 g 200 mL/hr over 30 Minutes Intravenous On call to O.R. 06/13/23 1347 06/13/23 1824   06/12/23 1000  cefTRIAXone (ROCEPHIN) 2 g in sodium chloride 0.9 % 100 mL IVPB  2 g 200 mL/hr over 30 Minutes Intravenous Every 24 hours 06/11/23 1700 06/18/23 0958   06/11/23 1615  cefTRIAXone (ROCEPHIN) 2 g in sodium chloride 0.9 % 100 mL IVPB        2 g 200 mL/hr over 30 Minutes Intravenous  Once 06/11/23 1608 06/11/23 1725       Procedures: 9/9 Left elbow olecranon bursa excision with excisional debridement as well as arthrotomy of the elbow with irrigation and washout.   Consultants: Orthopedics ID

## 2023-06-15 NOTE — Progress Notes (Signed)
Orthopedic Tech Progress Note Patient Details:  Keith Swanson [birth year] [id number]  Ortho Devices Type of Ortho Device: Arm sling Ortho Device/Splint Location: left Ortho Device/Splint Interventions: Ordered, Application, Adjustment   Post Interventions Patient Tolerated: Well Instructions Provided: Adjustment of device, Care of device  Kizzie Fantasia 06/15/2023, 11:14 AM

## 2023-06-15 NOTE — Evaluation (Addendum)
Occupational Therapy Evaluation Patient Details Name: Keith Swanson MRN: [id number] DOB: 09-20-1976 Today's Date: 06/15/2023   History of Present Illness Mr. Amirian is a 47 yr old male admitted to the hospital with L elbow infected olecranon bursitis with infected joint and he is s/p an excision, irrigation, debridement & washout on 06-13-23.   Clinical Impression   The pt currently presents with L UE non-weight-bearing status, restricted L elbow ROM, and orders for wearing of L UE sling. As such, his overall ADL performance and functional independence is compromised. He denied having pain during the OT session, indicating he was recently given pain medication. OT educated him on L UE precautions, including NWB, how to donn and adjust sling, performing wrist and shoulder ROM, and orders to not perform elbow ROM. Pt will likely only require 1 follow-up OT session during his hospital stay, to reinforce the aforementioned and to provided education on compensatory strategies for performing ADLs.       If plan is discharge home, recommend the following: Assist for transportation;Assistance with cooking/housework    Functional Status Assessment  Patient has had a recent decline in their functional status and demonstrates the ability to make significant improvements in function in a reasonable and predictable amount of time.  Equipment Recommendations  None recommended by OT    Recommendations for Other Services       Precautions / Restrictions Precautions Precautions: Shoulder Shoulder Interventions: Shoulder sling/immobilizer Precaution Comments: LUE sling Required Braces or Orthoses: Sling Restrictions Weight Bearing Restrictions: Yes LUE Weight Bearing: Non weight bearing Other Position/Activity Restrictions: no L elbow ROM, okay to perform L UE wrist and shoulder ROM (per secure chat with ortho PA, Harriette Bouillon)      Mobility Bed Mobility Overal bed mobility: Needs Assistance Bed  Mobility: Supine to Sit     Supine to sit: Supervision, HOB elevated          Transfers Overall transfer level: Needs assistance Equipment used: None Transfers: Sit to/from Stand Sit to Stand: Supervision                  Balance Overall balance assessment: No apparent balance deficits (not formally assessed)       ADL either performed or assessed with clinical judgement   ADL Overall ADL's : Needs assistance/impaired Eating/Feeding: Set up;Sitting   Grooming: Set up;Sitting Grooming Details (indicate cue type and reason): simulated         Upper Body Dressing : Minimal assistance;Sitting   Lower Body Dressing: Contact guard assist;Sit to/from stand;Set up Lower Body Dressing Details (indicate cue type and reason): set-up seated or CGA standing Toilet Transfer: Ambulation;Supervision/safety Toilet Transfer Details (indicate cue type and reason): at bathroom level, based on clinical judgement                  Pertinent Vitals/Pain Pain Assessment Pain Assessment: No/denies pain     Extremity/Trunk Assessment Upper Extremity Assessment Upper Extremity Assessment: LUE deficits/detail;RUE deficits/detail (Primary Right Upper Extremity Dominant, however pt reports being largely ambidextrous) RUE Deficits / Details: AROM and strength WFL LUE Deficits / Details: elbow ROM not performed, per ortho order. Shoulder and wrist AROM WFL           Communication Communication Communication: No apparent difficulties   Cognition Arousal: Alert Behavior During Therapy: WFL for tasks assessed/performed Overall Cognitive Status: Within Functional Limits for tasks assessed          General Comments: Oriented x4, able to follow commands without difficulty  Home Living Family/patient expects to be discharged to:: Private residence Living Arrangements: Parent (mother)   Type of Home: House Home Access: Level entry     Home Layout: Two  level Alternate Level Stairs-Number of Steps: his bedroom is upstairs             Home Equipment: None          Prior Functioning/Environment Prior Level of Function : Independent/Modified Independent             Mobility Comments: He was independent with ambulation. ADLs Comments: He was independent with ADLs.        OT Problem List: Impaired UE functional use;Decreased range of motion;Decreased knowledge of precautions      OT Treatment/Interventions: Self-care/ADL training;Therapeutic exercise;DME and/or AE instruction;Therapeutic activities;Patient/family education    OT Goals(Current goals can be found in the care plan section) Acute Rehab OT Goals OT Goal Formulation: With patient Time For Goal Achievement: 06/29/23 Potential to Achieve Goals: Good ADL Goals Pt Will Perform Upper Body Dressing: with modified independence;sitting Pt Will Perform Lower Body Dressing: with modified independence;sit to/from stand Additional ADL Goal #1: The pt will independently perfor L UE shoulder and wrist ROM, in order to decrease the risk for joint stiffness and restricted functional use of UE.  OT Frequency: Min 1X/week       AM-PAC OT "6 Clicks" Daily Activity     Outcome Measure Help from another person eating meals?: A Little Help from another person taking care of personal grooming?: A Little Help from another person toileting, which includes using toliet, bedpan, or urinal?: A Little Help from another person bathing (including washing, rinsing, drying)?: A Little Help from another person to put on and taking off regular upper body clothing?: A Little Help from another person to put on and taking off regular lower body clothing?: A Little 6 Click Score: 18   End of Session Equipment Utilized During Treatment: Gait belt Nurse Communication: Mobility status  Activity Tolerance: Patient tolerated treatment well Patient left: in chair;with call bell/phone within  reach  OT Visit Diagnosis: Pain                Time: 1105-1131 OT Time Calculation (min): 26 min Charges:  OT General Charges $OT Visit: 1 Visit OT Evaluation $OT Eval Low Complexity: 1 Low OT Treatments $Therapeutic Activity: 8-22 mins    Dera Vanaken J, OTR/L 06/15/2023, 12:37 PM

## 2023-06-16 DIAGNOSIS — M009 Pyogenic arthritis, unspecified: Secondary | ICD-10-CM | POA: Diagnosis not present

## 2023-06-16 DIAGNOSIS — R7401 Elevation of levels of liver transaminase levels: Secondary | ICD-10-CM | POA: Diagnosis not present

## 2023-06-16 LAB — CULTURE, BLOOD (ROUTINE X 2)
Culture: NO GROWTH
Culture: NO GROWTH
Special Requests: ADEQUATE
Special Requests: ADEQUATE

## 2023-06-16 LAB — CBC WITH DIFFERENTIAL/PLATELET
Abs Immature Granulocytes: 0.32 10*3/uL — ABNORMAL HIGH (ref 0.00–0.07)
Basophils Absolute: 0.1 10*3/uL (ref 0.0–0.1)
Basophils Relative: 1 %
Eosinophils Absolute: 0.3 10*3/uL (ref 0.0–0.5)
Eosinophils Relative: 2 %
HCT: 36.9 % — ABNORMAL LOW (ref 39.0–52.0)
Hemoglobin: 12 g/dL — ABNORMAL LOW (ref 13.0–17.0)
Immature Granulocytes: 3 %
Lymphocytes Relative: 31 %
Lymphs Abs: 3.5 10*3/uL (ref 0.7–4.0)
MCH: 29.4 pg (ref 26.0–34.0)
MCHC: 32.5 g/dL (ref 30.0–36.0)
MCV: 90.4 fL (ref 80.0–100.0)
Monocytes Absolute: 1.1 10*3/uL — ABNORMAL HIGH (ref 0.1–1.0)
Monocytes Relative: 10 %
Neutro Abs: 6.1 10*3/uL (ref 1.7–7.7)
Neutrophils Relative %: 53 %
Platelets: 204 10*3/uL (ref 150–400)
RBC: 4.08 MIL/uL — ABNORMAL LOW (ref 4.22–5.81)
RDW: 13 % (ref 11.5–15.5)
WBC: 11.3 10*3/uL — ABNORMAL HIGH (ref 4.0–10.5)
nRBC: 0 % (ref 0.0–0.2)

## 2023-06-16 LAB — COMPREHENSIVE METABOLIC PANEL
ALT: 38 U/L (ref 0–44)
AST: 27 U/L (ref 15–41)
Albumin: 3.1 g/dL — ABNORMAL LOW (ref 3.5–5.0)
Alkaline Phosphatase: 56 U/L (ref 38–126)
Anion gap: 10 (ref 5–15)
BUN: 13 mg/dL (ref 6–20)
CO2: 24 mmol/L (ref 22–32)
Calcium: 8.7 mg/dL — ABNORMAL LOW (ref 8.9–10.3)
Chloride: 103 mmol/L (ref 98–111)
Creatinine, Ser: 0.79 mg/dL (ref 0.61–1.24)
GFR, Estimated: >60 mL/min (ref >60)
Glucose, Bld: 103 mg/dL — ABNORMAL HIGH (ref 70–99)
Potassium: 3.9 mmol/L (ref 3.5–5.1)
Sodium: 137 mmol/L (ref 135–145)
Total Bilirubin: 0.5 mg/dL (ref 0.3–1.2)
Total Protein: 6.1 g/dL — ABNORMAL LOW (ref 6.5–8.1)

## 2023-06-16 NOTE — Progress Notes (Addendum)
  Progress Note   Patient: Keith Swanson WNU:[id number] DOB: [birth year] DOA: 06/11/2023     5 DOS: the patient was seen and examined on 06/16/2023   Brief hospital course: 47 year old man presented with left elbow pain.  Admitted for cellulitis of the left elbow.  Seen by orthopedics and underwent surgery as below.  Consultants Orthopedics   Procedures 9/9 Left elbow olecranon bursa excision with excisional debridement as well as arthrotomy of the elbow with irrigation and washout.   Assessment and Plan: Left elbow cellulitis Septic arthritis of the olecranon joint Initial CT scan did not show any evidence of abscess. Because of significant swelling and limitation in range of mobility, orthopedics was consulted 9/8 MRI left elbow showed possibility of left septic arthritis and olecranon bursitis. 9/9, patient underwent left elbow olecranon bursa excisional debridement as well as atherectomy of the elbow with irrigation and washout. Clinically improving.  On empiric antibiotics.  Continue to culture data. Nonweightbearing to operative extremity.  Remain in sling. Follow-up with Dr. August Saucer in clinic 10 days postoperatively  Left facial swelling Nontender.  LAD left posterior chain noted. CT maxillofacial was nonacute   Elevated blood pressure Appears to be resolved.  Probably related to acute infection and pain.   Mild intermittent asthma Stable.  Bronchodilators as needed.   Transaminitis -- resolved Alcoholism RUQ ultrasound with no acute findings of gallstones or cholecystitis. Recommend alcohol cessation   Hypoalbuminemia In the setting of acute infection.   Tobacco use Nicotine replacement therapy as needed.  Improving.  Follow culture data.  Hopefully home soon.    Subjective:  Feels ok Still has a little bit of left jaw swelling  Physical Exam: Vitals:   06/15/23 0537 06/15/23 1345 06/15/23 2221 06/16/23 0551  BP: 125/88 (!) 133/91 (!) 145/111 (!) 128/96  Pulse:  69 79 77 67  Resp: 16 17 16 15   Temp: 98.1 F (36.7 C) 97.8 F (36.6 C) 98.3 F (36.8 C) 98.1 F (36.7 C)  TempSrc: Oral  Oral Oral  SpO2: 99% 99% 100% 100%  Weight:      Height:       Physical Exam Vitals reviewed.  Constitutional:      General: He is not in acute distress.    Appearance: He is not ill-appearing or toxic-appearing.  Cardiovascular:     Rate and Rhythm: Normal rate and regular rhythm.     Heart sounds: No murmur heard. Pulmonary:     Effort: Pulmonary effort is normal. No respiratory distress.     Breath sounds: No wheezing, rhonchi or rales.  Neurological:     Mental Status: He is alert.  Psychiatric:        Mood and Affect: Mood normal.        Behavior: Behavior normal.     Data Reviewed: CMP noted WBC 16.3 > 11.3 Hgb stable 12.0  Family Communication: caretaker at bedside  Disposition: Status is: Inpatient Remains inpatient appropriate because: septic arthritis     Time spent: 20 minutes  Author: Brendia Sacks, MD 06/16/2023 8:56 AM  For on call review [email or web address].

## 2023-06-16 NOTE — Progress Notes (Signed)
  Subjective: Patient is a 47 year old male who presents POD 3 s/p left elbow irrigation and debridement with olecranon bursectomy for septic arthritis and septic bursitis.  Pain better controlled, just worse at night. No consistent fevers/chills.  Objective: Vital signs in last 24 hours: Temp:  [97.8 F (36.6 C)-98.3 F (36.8 C)] 98.1 F (36.7 C) (09/12 0551) Pulse Rate:  [67-79] 67 (09/12 0551) Resp:  [15-17] 15 (09/12 0551) BP: (128-145)/(91-111) 128/96 (09/12 0551) SpO2:  [99 %-100 %] 100 % (09/12 0551)  Intake/Output from previous day: 09/11 0701 - 09/12 0700 In: 1114.4 [P.O.:1080; IV Piggyback:34.4] Out: -  Intake/Output this shift: No intake/output data recorded.  Exam:  Ortho exam demonstrates intact EPL, FPL, finger abduction, grip strength of left hand. Elbow in splint.    Labs: Recent Labs    06/14/23 0333 06/15/23 0336 06/16/23 0326  HGB 13.7 12.2* 12.0*   Recent Labs    06/15/23 0336 06/16/23 0326  WBC 16.2* 11.3*  RBC 4.13* 4.08*  HCT 36.9* 36.9*  PLT 196 204   Recent Labs    06/15/23 0336 06/16/23 0326  NA 136 137  K 3.8 3.9  CL 104 103  CO2 23 24  BUN 13 13  CREATININE 0.81 0.79  GLUCOSE 109* 103*  CALCIUM 8.3* 8.7*   No results for input(s): "LABPT", "INR" in the last 72 hours.  Assessment/Plan: Patient is POD 3 s/p left elbow I&D.  - Cultures still with no growth - Nonweightbearing to operative extremity.  Remain in sling. - Follow-up with Dr. August Saucer in clinic 10 days postoperatively   [facility] 06/16/2023, 12:14 PM

## 2023-06-16 NOTE — Plan of Care (Signed)
  Problem: Health Behavior/Discharge Planning: Goal: Ability to manage health-related needs will improve Outcome: Progressing   Problem: Clinical Measurements: Goal: Will remain free from infection Outcome: Progressing   Problem: Activity: Goal: Risk for activity intolerance will decrease Outcome: Progressing   

## 2023-06-17 ENCOUNTER — Other Ambulatory Visit: Payer: Self-pay

## 2023-06-17 DIAGNOSIS — M009 Pyogenic arthritis, unspecified: Secondary | ICD-10-CM

## 2023-06-17 DIAGNOSIS — Z880 Allergy status to penicillin: Secondary | ICD-10-CM

## 2023-06-17 DIAGNOSIS — M7022 Olecranon bursitis, left elbow: Secondary | ICD-10-CM | POA: Diagnosis not present

## 2023-06-17 MED ORDER — SODIUM CHLORIDE 0.9 % IV SOLN
8.0000 mg/kg | Freq: Every day | INTRAVENOUS | Status: DC
  Administered 2023-06-17 – 2023-06-18 (×2): 750 mg via INTRAVENOUS
  Filled 2023-06-17 (×2): qty 15

## 2023-06-17 MED ORDER — SODIUM CHLORIDE 0.9 % IV SOLN: INTRAVENOUS | Status: DC | PRN

## 2023-06-17 MED ORDER — DAPTOMYCIN IV (FOR PTA / DISCHARGE USE ONLY): 750.0000 mg | INTRAVENOUS | 0 refills | Status: AC

## 2023-06-17 MED ORDER — CEFTRIAXONE IV (FOR PTA / DISCHARGE USE ONLY): 2.0000 g | INTRAVENOUS | 0 refills | Status: AC

## 2023-06-17 NOTE — Progress Notes (Signed)
PHARMACY CONSULT NOTE FOR:  OUTPATIENT  PARENTERAL ANTIBIOTIC THERAPY (OPAT)  Indication: L-elbow septic arthritis Regimen: Daptomycin 750 mg IV every 24 hours and Rocephin 2g IV every 24 hours End date: 07/25/23  IV antibiotic discharge orders are pended. To discharging provider:  please sign these orders via discharge navigator,  Select New Orders & click on the button choice - Manage This Unsigned Work.     Thank you for allowing pharmacy to be a part of this patient's care.  Georgina Pillion, PharmD, BCPS, BCIDP Infectious Diseases Clinical Pharmacist 06/17/2023 2:53 PM   **Pharmacist phone directory can now be found on [email or web address] ([email or web address]).  Listed under [facility].

## 2023-06-17 NOTE — Progress Notes (Signed)
Patient missing from room/unit for prolonged time. When patient returned, RN discussed w/ patient importance of not leaving unit for safety and for availability of staff to locate him for procedures or consults. Patient verbalized understanding.

## 2023-06-17 NOTE — Progress Notes (Signed)
Occupational Therapy Treatment Patient Details Name: Keith Swanson MRN: [id number] DOB: 09/12/1976 Today's Date: 06/17/2023   History of present illness Keith Swanson is a 47 yr old male admitted to the hospital with L elbow infected olecranon bursitis with infected joint and he is s/p an excision, irrigation, debridement & washout on 06-13-23.   OT comments  OT further reinforced  L UE precautions, including NWB status, how to donn and adjust sling, sling wear schedule, performing wrist and shoulder ROM, and no elbow ROM. Pt presented with good understanding, recall, and teach back abilities. He has subsequently met his therapy goals and does not require further OT services. OT will sign off.       If plan is discharge home, recommend the following:  Assistance with cooking/housework   Equipment Recommendations  None recommended by OT    Recommendations for Other Services      Precautions / Restrictions Precautions Shoulder Interventions: Shoulder sling/immobilizer Precaution Comments: LUE sling Required Braces or Orthoses: Sling Restrictions Weight Bearing Restrictions: Yes LUE Weight Bearing: Non weight bearing Other Position/Activity Restrictions: no L elbow ROM, okay to perform L UE wrist and shoulder ROM (per secure chat with ortho PA, Keith Swanson)       Mobility Bed Mobility Overal bed mobility: Needs Assistance Bed Mobility: Supine to Sit     Supine to sit: Independent          Transfers Overall transfer level: Independent Equipment used: None Transfers: Sit to/from Stand Sit to Stand: Independent                 Balance Overall balance assessment: Independent          ADL either performed or assessed with clinical judgement   ADL Overall ADL's : Independent;Modified independent             Cognition Arousal: Alert Behavior During Therapy: WFL for tasks assessed/performed Overall Cognitive Status: Within Functional Limits for tasks  assessed      General Comments: Oriented x4, able to follow commands without difficulty                   Pertinent Vitals/ Pain       Pain Assessment Pain Assessment:  (No pain reported during session.)         Frequency   (N/A)        Progress Toward Goals   Progress towards OT goals: Goals met/education completed, patient discharged from OT            AM-PAC OT "6 Clicks" Daily Activity     Outcome Measure   Help from another person eating meals?: None Help from another person taking care of personal grooming?: None Help from another person toileting, which includes using toliet, bedpan, or urinal?: None Help from another person bathing (including washing, rinsing, drying)?: None Help from another person to put on and taking off regular upper body clothing?: None Help from another person to put on and taking off regular lower body clothing?: None 6 Click Score: 24    End of Session Equipment Utilized During Treatment: Other (comment) (none)  OT Visit Diagnosis: Pain   Activity Tolerance Patient tolerated treatment well   Patient Left Other (comment) (ambulating to bathroom in his room)   Nurse Communication Other (comment)        Time: 4696-2952 OT Time Calculation (min): 13 min  Charges: OT General Charges $OT Visit: 1 Visit OT Treatments $Therapeutic Activity: 8-22 mins  Dutch Gray, OTR/l 06/17/2023, 5:21 PM

## 2023-06-17 NOTE — Progress Notes (Signed)
Progress Note   Patient: Keith Swanson ZOX:[id number] DOB: [birth year] DOA: 06/11/2023     6 DOS: the patient was seen and examined on 06/17/2023   Brief hospital course: Patient is a 47 year old male with past medical history significant for anaphylaxis, asthma, tobacco and alcohol use.  Patient was admitted with left elbow pain.  Workup revealed infected olecranon bursitis and infected/septic left elbow joint.  Patient underwent excision of the left elbow olecranon bursa and arthrotomy of the left elbow with irrigation and washout on 06/13/2023.  Patient has been on IV Rocephin.  06/17/2023: Patient seen.  Patient feels a lot better.  Infectious disease team was consulted to assist with antibiotics management.  As per infectious disease team recommendation, patient will continue on IV Rocephin 2 g daily till 07/25/2023.  IV daptomycin 750 Mg once daily was started today and patient will continue antibiotics till 07/25/2023.  Infectious disease input is highly appreciated.  Assessment and Plan: Left elbow cellulitis/Septic arthritis of left elbow and infected olecranon bursitis:  Initial CT scan did not show any evidence of abscess. Because of significant swelling and limitation in range of mobility, orthopedics was consulted 9/8 MRI left elbow showed possibility of left septic arthritis and olecranon bursitis. 9/9, patient underwent left elbow olecranon bursa excisional debridement as well as atherectomy of the elbow with irrigation and washout. Clinically improving.   Cultures have not grown any organisms. Infectious disease team consulted today, 06/17/2023 to assist with antibiotics management.  Patient will continue IV Rocephin 2 g daily till 07/25/2023.  IV daptomycin 750 Mg once daily has been started today.  Patient will continue IV daptomycin till 07/25/2023.  Patient will need PICC line placement prior to discharge.  Earliest discharge will be Monday, 06/20/2023.  O Nonweightbearing to operative  extremity.  Remain in sling. Follow-up with Dr. August Saucer in clinic 10 days postoperatively  Left facial swelling Resolved.  Nontender.  CT maxillofacial was nonacute   Elevated blood pressure Likely previously undiagnosed hypertension. Consider amlodipine if blood pressure remains elevated. Patient is currently on hydralazine as needed.   Mild intermittent asthma Stable.  Bronchodilators as needed.   Transaminitis -- resolved Alcoholism RUQ ultrasound with no acute findings of gallstones or cholecystitis. Recommend alcohol cessation   Hypoalbuminemia In the setting of acute infection.   Tobacco use Nicotine replacement therapy as needed.  Improving.  Follow culture data.  Hopefully home soon.    Subjective:  -No new complaints. -No fever or chills.   Physical Exam: Vitals:   06/16/23 1307 06/16/23 2103 06/17/23 0700 06/17/23 1323  BP: (!) 133/98 (!) 142/103 (!) 144/115 (!) 153/109  Pulse: 66 74 64 77  Resp: 18 18 18 18   Temp: 97.9 F (36.6 C) 97.9 F (36.6 C) 98 F (36.7 C) 98.5 F (36.9 C)  TempSrc:  Oral Oral   SpO2: 99% 98% 100% 100%  Weight:      Height:       Physical Exam Vitals reviewed.  Constitutional:      General: He is not in acute distress.    Appearance: He is not ill-appearing, toxic-appearing or diaphoretic.  HENT:     Right Ear: External ear normal.     Left Ear: External ear normal.     Nose: Nose normal.     Mouth/Throat:     Mouth: Mucous membranes are moist.     Pharynx: Oropharynx is clear.  Eyes:     Extraocular Movements: Extraocular movements intact.     Pupils: Pupils are  equal, round, and reactive to light.  Cardiovascular:     Rate and Rhythm: Normal rate and regular rhythm.     Pulses: Normal pulses.     Heart sounds: Normal heart sounds. No murmur heard. Pulmonary:     Effort: Pulmonary effort is normal. No respiratory distress.     Breath sounds: No wheezing, rhonchi or rales.  Abdominal:     General: Bowel sounds  are normal.     Palpations: Abdomen is soft.  Musculoskeletal:     Cervical back: Neck supple.     Right lower leg: No edema.     Left lower leg: No edema.  Neurological:     General: No focal deficit present.     Mental Status: He is alert and oriented to person, place, and time. Mental status is at baseline.  Psychiatric:        Mood and Affect: Mood normal.        Behavior: Behavior normal.        Thought Content: Thought content normal.        Judgment: Judgment normal.     Data Reviewed: -CMP revealed sodium of 137, potassium of 3.9, CO2 24, BUN of 13, serum creatinine of 0.79, blood sugar of 103, albumin at 3.1, AST of 27, ALT of 38, total protein of 6.1 and total bilirubin of 0.5.  Estimated GFR is greater than 60 mL/min per 1.73 m. -Last CBC done on 06/16/2023 revealed WBC of 11.3, hemoglobin of 12, hematocrit of 36.9 and platelet count of 204.   Family Communication:   Disposition: Status is: Inpatient Remains inpatient appropriate because: septic arthritis     Time spent: 35 minutes.    Author: Barnetta Chapel, MD 06/17/2023 5:21 PM  For on call review [email or web address].

## 2023-06-17 NOTE — Consult Note (Signed)
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 Plan on giving him 6 weeks of therapy OPAT orders as follows  Diagnosis: L-elbow septic arthritis   Culture Result: no growth  Allergies  Allergen Reactions   Penicillins Swelling    Did it involve swelling of the face/tongue/throat, SOB, or low BP? Yes Did it involve sudden or severe rash/hives, skin peeling, or any reaction on the inside of your mouth or nose? No Did you need to seek medical attention at a hospital or doctor's office? No When did it last happen?     childhood  If all above answers are "NO", may proceed with cephalosporin use.    Doxycycline Hives    OPAT Orders Discharge antibiotics to be given via PICC line Discharge antibiotics: Daptomycin 750 mg IV every 24 hours and Rocephin 2g IV every 24 hours   Duration: 6 weeks End Date:  07/25/23   [facility] Care Per Protocol:  Home health RN for IV administration and teaching; PICC line care and labs.    Labs weekly while on IV antibiotics: _x_ CBC with differential _x_ BMP __ CMP _x_ CRP _x_ ESR __ Vancomycin trough _x_ CK  _x_ Please pull PIC at completion of IV antibiotics __ Please leave PIC in place until doctor has seen patient or been notified  Fax weekly labs to [phone]   Keith Swanson has an appointment on 07/19/2023 at 9AM with Dr. Elinor Parkinson at  [facility], which  is located in the [facility] at  [address] in [place].  [address], which is located to the left of the elevators.  Phone: [phone]  Fax: [phone]  [email or web address]  The patient should arrive 30 minutes prior to their appoitment.  Dr. Luciana Axe is elbow for questions this weekend and we will follow-up on his operative cultures.  Principal Problem:   Septic arthritis of elbow, left (HCC) Active Problems:   Mild intermittent asthma with (acute) exacerbation   Cellulitis of left elbow   Transaminitis   Hypoalbuminemia   Tobacco use   Scheduled Meds:  celecoxib  100 mg Oral BID   docusate sodium  100 mg Oral BID   melatonin  3 mg Oral QHS   methocarbamol  500 mg Oral TID   pantoprazole  40 mg Oral Daily   Continuous Infusions:  sodium chloride Stopped (06/17/23 1252)   cefTRIAXone (ROCEPHIN)  IV Stopped (06/17/23 1115)   DAPTOmycin (CUBICIN) 750 mg in sodium chloride 0.9 % IVPB     methocarbamol (ROBAXIN) IV     PRN Meds:.sodium chloride, acetaminophen, hydrALAZINE, HYDROmorphone (DILAUDID) injection, hydrOXYzine, methocarbamol (ROBAXIN) IV, ondansetron **OR** ondansetron (ZOFRAN) IV, mouth rinse, oxyCODONE, oxyCODONE  HPI: Keith Swanson is a 47 y.o. male with prior history of [place] spotted fever, who was donating plasma when the phlebotomist who was trying to access his vein Goldie's and a "popping noise was heard.  They had  to use the vein on his opposite arm.  In the interval though however he developed worsening pain and tenderness at the site of attempted phlebotomy.  This progressed in the interim with swelling of the elbow that progressed and made it difficult for him to do things such as placed the microwave oven and his mother's house.  This became severe and he ultimately came to the ER.  He had blood cultures taken and was placed on ceftriaxone and vancomycin for possible "cellulitis".  Films of the elbow had shown a metallic  foreign body which I suspect was due to the needle that was placed  Orthopedic surgery saw the patient as well.  In the interim his swelling worsened MRI of the joint showed:  Prominent soft tissue swelling and fluid in the posterior aspect of the left elbow and visualized arm with a well-defined fluid collection in the posterior olecranon measuring 4.2 x 1.2 x 3.4 cm with a elbow effusion as well.  The patient was taken to the operating room by Dr. August Saucer performed excision of the left elbow olecranon bursa as well as arthrotomy of the left elbow with irrigation and debridement.  Cultures taken from the operative room so far have not yielded an organism I suspect due to the antecedent antibiotics he received prior to surgery.  Not received any vancomycin I would vote for just giving him Rocephin and his presentation sounds more like a streptococcal 1.  That being said he did receive vancomycin preoperatively and I think that we need to also cover for MRSA.  Will therefore plan on placing him on a 6-week course of ceftriaxone and daptomycin with plans for him to see [place] in clinic in roughly 4 weeks time.  I have personally spent 84 minutes involved in face-to-face and non-face-to-face activities for this patient on the day of the visit. Professional time spent includes the following activities: Preparing to see the patient (review of tests), Obtaining and/or reviewing separately obtained history (admission/discharge record), Performing a medically appropriate examination and/or evaluation , Ordering medications/tests/procedures, referring and communicating with other health care professionals, Documenting clinical information in the EMR, Independently interpreting results (not separately reported), Communicating results to the patient/family/caregiver, Counseling and educating the patient/family/caregiver and Care coordination (not separately reported).    Review of Systems: Review of Systems   Constitutional:  Negative for chills, fever, malaise/fatigue and weight loss.  HENT:  Negative for congestion and sore throat.   Eyes:  Negative for blurred vision and photophobia.  Respiratory:  Negative for cough, shortness of breath and wheezing.   Cardiovascular:  Negative for chest pain, palpitations and leg swelling.  Gastrointestinal:  Negative for abdominal pain, blood in stool, constipation, diarrhea, heartburn, melena, nausea and vomiting.  Genitourinary:  Negative for dysuria, flank pain and hematuria.  Musculoskeletal:  Positive for joint pain and myalgias. Negative for back pain and falls.  Skin:  Negative for itching and rash.  Neurological:  Negative for dizziness, focal weakness, loss of consciousness, weakness and headaches.  Endo/Heme/Allergies:  Does not bruise/bleed easily.  Psychiatric/Behavioral:  Negative for depression and suicidal ideas. The patient does not have insomnia.     Past Medical History:  Diagnosis Date   Anaphylaxis    Asthma    Bronchitis    Low back pain     Social History   Tobacco Use   Smoking status: Former    Current packs/day: 0.50    Types: Cigarettes   Smokeless tobacco: Never  Vaping Use  Vaping status: Never Used  Substance Use Topics   Alcohol use: Not Currently   Drug use: No    Family History  Problem Relation Age of Onset   Hypertension Mother    Heart attack Father    Prostate cancer Father    Cancer Other    Cancer Maternal Grandmother        Soft tissue sarcoma.   Allergies  Allergen Reactions   Penicillins Swelling    Did it involve swelling of the face/tongue/throat, SOB, or low BP? Yes Did it involve sudden or severe rash/hives, skin peeling, or any reaction on the inside of your mouth or nose? No Did you need to seek medical attention at a hospital or doctor's office? No When did it last happen?     childhood  If all above answers are "NO", may proceed with cephalosporin use.    Doxycycline Hives     OBJECTIVE: Blood pressure (!) 153/109, pulse 77, temperature 98.5 F (36.9 C), resp. rate 18, height 6' (1.829 m), weight 94.8 kg, SpO2 100%.  Physical Exam Constitutional:      Appearance: He is well-developed.  HENT:     Head: Normocephalic and atraumatic.  Eyes:     Conjunctiva/sclera: Conjunctivae normal.  Cardiovascular:     Rate and Rhythm: Normal rate and regular rhythm.  Pulmonary:     Effort: Pulmonary effort is normal. No respiratory distress.     Breath sounds: No wheezing.  Abdominal:     General: There is no distension.     Palpations: Abdomen is soft.  Musculoskeletal:     Cervical back: Normal range of motion and neck supple.  Skin:    General: Skin is warm and dry.     Coloration: Skin is not pale.     Findings: No erythema or rash.  Neurological:     General: No focal deficit present.     Mental Status: He is alert and oriented to person, place, and time.  Psychiatric:        Mood and Affect: Mood normal.        Behavior: Behavior normal.        Thought Content: Thought content normal.        Judgment: Judgment normal.    Left elbow bandaged and with sling  Lab Results Lab Results  Component Value Date   WBC 11.3 (H) 06/16/2023   HGB 12.0 (L) 06/16/2023   HCT 36.9 (L) 06/16/2023   MCV 90.4 06/16/2023   PLT 204 06/16/2023    Lab Results  Component Value Date   CREATININE 0.79 06/16/2023   BUN 13 06/16/2023   NA 137 06/16/2023   K 3.9 06/16/2023   CL 103 06/16/2023   CO2 24 06/16/2023    Lab Results  Component Value Date   ALT 38 06/16/2023   AST 27 06/16/2023   ALKPHOS 56 06/16/2023   BILITOT 0.5 06/16/2023     Microbiology: Recent Results (from the past 240 hour(s))  Blood culture (routine x 2)     Status: None   Collection Time: 06/11/23  2:42 PM   Specimen: BLOOD RIGHT FOREARM  Result Value Ref Range Status   Specimen Description   Final    BLOOD RIGHT FOREARM Performed at [facility], [address]  [place], [place] [address]    Special Requests   Final    BOTTLES DRAWN AEROBIC AND ANAEROBIC Blood Culture adequate volume Performed at [facility], [address] [place], [place] [address]    Culture   Final    NO GROWTH 5 DAYS Performed at [facility], [address], [place], [place] [address]    Report Status 06/16/2023 FINAL  Final  Blood culture (routine x 2)     Status: None   Collection Time: 06/11/23  6:36 PM   Specimen: BLOOD RIGHT ARM  Result Value Ref Range Status   Specimen Description BLOOD RIGHT ARM  Final   Special Requests   Final    BOTTLES DRAWN AEROBIC ONLY Blood Culture adequate volume   Culture   Final    NO GROWTH 5 DAYS Performed at [facility], [address], [place], [place] [address]    Report Status 06/16/2023 FINAL  Final  Aerobic/Anaerobic Culture w Gram Stain (surgical/deep wound)     Status: None (Preliminary result)   Collection Time: 06/13/23  6:13 PM   Specimen: Synovial, Left Elbow; Body Fluid  Result Value Ref Range Status   Specimen Description   Final    SYNOVIAL  LEFT ELBOW Performed at [facility], [address], [place], [place] [address]    Special Requests   Final    NONE Performed at [facility], [address], [place], [place] [address]    Gram Stain NO WBC SEEN NO ORGANISMS SEEN   Final   Culture   Final    NO GROWTH 4 DAYS NO ANAEROBES ISOLATED; CULTURE IN PROGRESS FOR 5 DAYS Performed at [facility], [address], [place], [place] [address]    Report Status PENDING  Incomplete  Aerobic/Anaerobic Culture w Gram Stain (surgical/deep wound)     Status: None (Preliminary result)   Collection Time: 06/13/23  8:22 PM   Specimen: Synovial, Left Elbow; Body Fluid  Result Value Ref Range Status   Specimen Description   Final    SYNOVIAL LEFT ELBOW Performed at [facility], [address], [place], [place] [address]    Special  Requests   Final    NONE Performed at [facility], [address] [place], [place] [address]    Gram Stain NO WBC SEEN NO ORGANISMS SEEN   Final   Culture   Final    NO GROWTH 4 DAYS NO ANAEROBES ISOLATED; CULTURE IN PROGRESS FOR 5 DAYS Performed at [facility], [address] [place], [place] [address]    Report Status PENDING  Incomplete  Aerobic/Anaerobic Culture w Gram Stain (surgical/deep wound)     Status: None (Preliminary result)   Collection Time: 06/13/23  8:22 PM   Specimen: Synovial, Left Elbow; Body Fluid  Result Value Ref Range Status   Specimen Description WOUND  Final   Special Requests LEFT ELBOW BURSA  Final   Gram Stain NO WBC SEEN NO ORGANISMS SEEN   Final   Culture   Final    NO GROWTH 4 DAYS NO ANAEROBES ISOLATED; CULTURE IN PROGRESS FOR 5 DAYS Performed at [facility], [address], [place], [place] [address]    Report Status PENDING  Incomplete    Acey Lav, MD [facility] [phone] pager  06/17/2023, 4:40 PM

## 2023-06-17 NOTE — TOC Progression Note (Signed)
Transition of Care [facility]) - Progression Note    Patient Details  Name: Keith Swanson MRN: [id number] Date of Birth: [birth year]  Transition of Care [facility]) CM/SW Contact  Amada Jupiter, LCSW Phone Number: 06/17/2023, 5:00 PM  Clinical Narrative:     Alerted this afternoon that plan now for pt to dc home with IV abx coverage.  Met with pt who is aware and agreeable and has no preference for infusion agency.  Have placed referral with [employer] (Pam C.) and have confirmed with pt that he plans to dc to his mother's home here locally (confirmed this is address on record here.)  At this time, Julianne Rice will work with pt/ family to get abx set up and authorized with his insurance.  Currently awaiting PICC placement as well.  Will alert weekend TOC coverage.       Expected Discharge Plan and Services                                               Social Determinants of Health (SDOH) Interventions SDOH Screenings   Food Insecurity: No Food Insecurity (06/11/2023)  Housing: Low Risk  (06/11/2023)  Transportation Needs: No Transportation Needs (06/11/2023)  Utilities: Not At Risk (06/11/2023)  Tobacco Use: Medium Risk (06/13/2023)    Readmission Risk Interventions    06/15/2023    1:36 PM  Readmission Risk Prevention Plan  Post Dischage Appt Complete  Medication Screening Complete  Transportation Screening Complete

## 2023-06-18 ENCOUNTER — Encounter (HOSPITAL_COMMUNITY): Payer: Self-pay | Admitting: Internal Medicine

## 2023-06-18 DIAGNOSIS — L03114 Cellulitis of left upper limb: Secondary | ICD-10-CM

## 2023-06-18 DIAGNOSIS — E8809 Other disorders of plasma-protein metabolism, not elsewhere classified: Secondary | ICD-10-CM | POA: Diagnosis not present

## 2023-06-18 DIAGNOSIS — Z720 Tobacco use: Secondary | ICD-10-CM

## 2023-06-18 DIAGNOSIS — M009 Pyogenic arthritis, unspecified: Secondary | ICD-10-CM | POA: Diagnosis not present

## 2023-06-18 DIAGNOSIS — F1011 Alcohol abuse, in remission: Secondary | ICD-10-CM | POA: Diagnosis not present

## 2023-06-18 LAB — CBC WITH DIFFERENTIAL/PLATELET
Abs Immature Granulocytes: 0.47 10*3/uL — ABNORMAL HIGH (ref 0.00–0.07)
Basophils Absolute: 0.1 10*3/uL (ref 0.0–0.1)
Basophils Relative: 1 %
Eosinophils Absolute: 0.3 10*3/uL (ref 0.0–0.5)
Eosinophils Relative: 3 %
HCT: 37.3 % — ABNORMAL LOW (ref 39.0–52.0)
Hemoglobin: 12.3 g/dL — ABNORMAL LOW (ref 13.0–17.0)
Immature Granulocytes: 4 %
Lymphocytes Relative: 30 %
Lymphs Abs: 3.3 10*3/uL (ref 0.7–4.0)
MCH: 29.4 pg (ref 26.0–34.0)
MCHC: 33 g/dL (ref 30.0–36.0)
MCV: 89.2 fL (ref 80.0–100.0)
Monocytes Absolute: 1.1 10*3/uL — ABNORMAL HIGH (ref 0.1–1.0)
Monocytes Relative: 10 %
Neutro Abs: 5.7 10*3/uL (ref 1.7–7.7)
Neutrophils Relative %: 52 %
Platelets: 244 10*3/uL (ref 150–400)
RBC: 4.18 MIL/uL — ABNORMAL LOW (ref 4.22–5.81)
RDW: 12.7 % (ref 11.5–15.5)
WBC: 11 10*3/uL — ABNORMAL HIGH (ref 4.0–10.5)
nRBC: 0 % (ref 0.0–0.2)

## 2023-06-18 LAB — AEROBIC/ANAEROBIC CULTURE W GRAM STAIN (SURGICAL/DEEP WOUND)
Culture: NO GROWTH
Culture: NO GROWTH
Gram Stain: NONE SEEN
Gram Stain: NONE SEEN

## 2023-06-18 LAB — CK: Total CK: 34 U/L — ABNORMAL LOW (ref 49–397)

## 2023-06-18 MED ORDER — HEPARIN SOD (PORK) LOCK FLUSH 100 UNIT/ML IV SOLN
250.0000 [IU] | INTRAVENOUS | Status: AC | PRN
  Administered 2023-06-18: 250 [IU]

## 2023-06-18 MED ORDER — METHOCARBAMOL 500 MG PO TABS: 500.0000 mg | ORAL_TABLET | Freq: Three times a day (TID) | ORAL | 0 refills | Status: AC

## 2023-06-18 MED ORDER — PANTOPRAZOLE SODIUM 40 MG PO TBEC: 40.0000 mg | DELAYED_RELEASE_TABLET | Freq: Every day | ORAL | 0 refills | Status: DC

## 2023-06-18 MED ORDER — ONDANSETRON HCL 4 MG PO TABS: 4.0000 mg | ORAL_TABLET | Freq: Four times a day (QID) | ORAL | 0 refills | Status: AC | PRN

## 2023-06-18 MED ORDER — CHLORHEXIDINE GLUCONATE CLOTH 2 % EX PADS: 6.0000 | MEDICATED_PAD | Freq: Every day | CUTANEOUS | Status: DC

## 2023-06-18 MED ORDER — CELECOXIB 100 MG PO CAPS: 100.0000 mg | ORAL_CAPSULE | Freq: Two times a day (BID) | ORAL | 0 refills | Status: DC

## 2023-06-18 MED ORDER — OXYCODONE HCL 5 MG PO TABS: 5.0000 mg | ORAL_TABLET | Freq: Four times a day (QID) | ORAL | 0 refills | Status: AC | PRN

## 2023-06-18 MED ORDER — SODIUM CHLORIDE 0.9% FLUSH: 10.0000 mL | Freq: Two times a day (BID) | INTRAVENOUS | Status: DC

## 2023-06-18 MED ORDER — SODIUM CHLORIDE 0.9% FLUSH: 10.0000 mL | INTRAVENOUS | Status: DC | PRN

## 2023-06-18 NOTE — Assessment & Plan Note (Signed)
Resolved. Pt recently discharge from alcohol rehab.

## 2023-06-18 NOTE — Progress Notes (Signed)
PROGRESS NOTE    Keith Swanson  ERX:[id number] DOB: 1975/11/21 DOA: 06/11/2023 PCP: Meryl Crutch, MD  Subjective: Pt seen and examined. Pt's girlfriend Samantha at bedside. Pt states he is going home to live with his mother. PICC to be placed today. Home health orders already completed. CM to verify that home care company will deliver abx to pt's home. Pt's mother has been trained to give IV ABX.   Hospital Course: HPI: Keith Swanson is a 47 y.o. male with medical history significant of anaphylaxis, asthma, bronchitis, mild tobacco use, alcohol abuse in remission but occasionally has 1 or 2 drinks during the weekend, history of lower back pain who is presenting to the emergency department complaints of left elbow pain, erythema, calor and edema.  The pain and the decrease in ROM mating come to the emergency department.  He stated he has been having some tingling in his left hand.  He has felt mildly febrile and had a night sweat last night.  The patient stated that he went to donate plasma 2 days ago and they have problems with the needle and had to switch sides. He denied rhinorrhea, sore throat, wheezing or hemoptysis.  No chest pain, palpitations, diaphoresis, PND, orthopnea or pitting edema of the lower extremities.  No abdominal pain, nausea, emesis, diarrhea, constipation, melena or hematochezia.  No flank pain, dysuria, frequency or hematuria.  No polyuria, polydipsia, polyphagia or blurred vision.    Lab work: His CBC showed a white count of 10.6, hemoglobin 13.9 g/dL platelets 950.  CMP with normal electrolytes after calcium correction, normal glucose and renal function.  Total protein 6.2 and albumin 3.4 g/dL.  AST 42 and ALT 57 units/L.  Normal alk phos and total bilirubin.   Imaging: Left elbow x-ray with no acute fracture or dislocation.  There is a metallic 7 mm density overlying the dorsal soft tissues of the proximal ulna.  Recommend correlation with any history of remote trauma.  Soft  tissue edema overlying the olecranon is no nonspecific but can be seen in the setting of olecranon bursitis.  Significant Events: Admitted 06/11/2023   Significant Labs: Blood and intra-operative cultures are negative  Significant Imaging Studies: MRI left elbow shows: 1. Prominent soft tissue swelling and fluid at the posterior aspect of the elbow and visualized left arm. There is a more well-defined fluid collection posterior to the olecranon process measuring approximately 4.2 x 1.2 x 3.4 cm. appearance favors cellulitis with olecranon bursitis, which may be septic. 2. Small elbow joint effusion. This is nonspecific and could be reactive. Septic arthritis is not entirely excluded. 3. No evidence of osteomyelitis.  Antibiotic Therapy: Anti-infectives (From admission, onward)    Start     Dose/Rate Route Frequency Ordered Stop   06/17/23 1530  DAPTOmycin (CUBICIN) 750 mg in sodium chloride 0.9 % IVPB        8 mg/kg  94.8 kg 130 mL/hr over 30 Minutes Intravenous Daily 06/17/23 1444     06/17/23 0000  daptomycin (CUBICIN) IVPB        750 mg Intravenous Every 24 hours 06/17/23 1452 07/25/23 2359   06/17/23 0000  cefTRIAXone (ROCEPHIN) IVPB        2 g Intravenous Every 24 hours 06/17/23 1452 07/25/23 2359   06/13/23 2030  vancomycin (VANCOCIN) IVPB 1000 mg/200 mL premix        1,000 mg 200 mL/hr over 60 Minutes Intravenous Every 12 hours 06/13/23 2028 06/13/23 2323   06/13/23 1852  vancomycin (VANCOCIN)  powder  Status:  Discontinued          As needed 06/13/23 1853 06/13/23 2045   06/13/23 1400  ceFAZolin (ANCEF) IVPB 2g/100 mL premix        2 g 200 mL/hr over 30 Minutes Intravenous On call to O.R. 06/13/23 1347 06/13/23 1824   06/12/23 1000  cefTRIAXone (ROCEPHIN) 2 g in sodium chloride 0.9 % 100 mL IVPB        2 g 200 mL/hr over 30 Minutes Intravenous Every 24 hours 06/11/23 1700 06/18/23 0958   06/11/23 1615  cefTRIAXone (ROCEPHIN) 2 g in sodium chloride 0.9 % 100 mL IVPB        2  g 200 mL/hr over 30 Minutes Intravenous  Once 06/11/23 1608 06/11/23 1725       Procedures: 9/9 Left elbow olecranon bursa excision with excisional debridement as well as arthrotomy of the elbow with irrigation and washout.   Consultants: Orthopedics ID    Assessment and Plan: * Septic arthritis of elbow, left (HCC) Pt admitted for septic bursitis of left elbow and left elbow septic arthritis. Underwent left elbow joint wash out and left elbow bursectomy.  Blood and intraoperative cultures are negative. ID consulted. Pt will be discharged on Daptomycin and IV Rocephin. PICC to be placed on day of discharge.   ID wants 6 weeks of Dapto and Rocephin.  Daptomycin 750 mg IV every 24 hours and Rocephin 2g IV every 24 hours  End Date: 07/25/23   Pt to followup with Dr. August Saucer in clinic 10 days postoperatively   Cellulitis of left elbow See a/p for septic arthritis of elbow  Will go home with prn oxycodone for pain and celebrex.  Tobacco use Stable.  Hypoalbuminemia Stable.  Transaminitis Resolved. Pt recently discharge from alcohol rehab.  History of alcohol abuse Pt recently released from alcohol rehab. Pt is going home to live with his mother.  Mild intermittent asthma with (acute) exacerbation Stable now. On RA.       DVT prophylaxis: SCDs Start: 06/13/23 2029    Code Status: Full Code Family Communication: discussed with pt and girlfriend Samantha at bedside Disposition Plan: return home Reason for continuing need for hospitalization: stable for DC today.  Objective: Vitals:   06/17/23 1842 06/17/23 2213 06/18/23 0623 06/18/23 1227  BP: (!) 138/97 (!) 137/97 (!) 129/99 (!) 147/108  Pulse: 79 83 67 74  Resp: 18 16 16 19   Temp: 98.6 F (37 C) 98.4 F (36.9 C) 97.9 F (36.6 C) 98 F (36.7 C)  TempSrc: Oral Oral Oral Oral  SpO2: 97% 100% 100% 99%  Weight:      Height:        Intake/Output Summary (Last 24 hours) at 06/18/2023 1334 Last data filed at  06/18/2023 1031 Gross per 24 hour  Intake 694.76 ml  Output --  Net 694.76 ml   Filed Weights   06/11/23 1800 06/13/23 1359  Weight: 94.8 kg 94.8 kg    Examination:  Physical Exam Vitals and nursing note reviewed.  Constitutional:      General: He is not in acute distress.    Appearance: He is not toxic-appearing.  HENT:     Head: Normocephalic and atraumatic.  Pulmonary:     Effort: No respiratory distress.  Musculoskeletal:     Comments: Left UE wrapped in bulky soft bandage  Neurological:     General: No focal deficit present.     Mental Status: He is alert and oriented to person,  place, and time.     Data Reviewed: I have personally reviewed following labs and imaging studies  CBC: Recent Labs  Lab 06/13/23 0259 06/14/23 0333 06/15/23 0336 06/16/23 0326 06/18/23 0404  WBC 11.4* 14.1* 16.2* 11.3* 11.0*  NEUTROABS 6.6 12.7* 11.9* 6.1 5.7  HGB 13.2 13.7 12.2* 12.0* 12.3*  HCT 39.4 40.6 36.9* 36.9* 37.3*  MCV 87.6 88.8 89.3 90.4 89.2  PLT 196 206 196 204 244   Basic Metabolic Panel: Recent Labs  Lab 06/12/23 0258 06/13/23 0259 06/14/23 0333 06/15/23 0336 06/16/23 0326  NA 136 135 136 136 137  K 3.7 3.8 4.4 3.8 3.9  CL 102 102 102 104 103  CO2 26 25 24 23 24   GLUCOSE 132* 117* 221* 109* 103*  BUN 13 16 15 13 13   CREATININE 0.83 0.91 0.94 0.81 0.79  CALCIUM 8.3* 8.3* 8.7* 8.3* 8.7*   GFR: Estimated Creatinine Clearance: 135 mL/min (by C-G formula based on SCr of 0.79 mg/dL). Liver Function Tests: Recent Labs  Lab 06/11/23 1434 06/12/23 0258 06/16/23 0326  AST 42* 34 27  ALT 57* 48* 38  ALKPHOS 54 59 56  BILITOT 0.8 0.8 0.5  PROT 6.2* 5.9* 6.1*  ALBUMIN 3.4* 3.1* 3.1*   Cardiac Enzymes: Recent Labs  Lab 06/18/23 0404  CKTOTAL 34*    Recent Results (from the past 240 hour(s))  Blood culture (routine x 2)     Status: None   Collection Time: 06/11/23  2:42 PM   Specimen: BLOOD RIGHT FOREARM  Result Value Ref Range Status   Specimen  Description   Final    BLOOD RIGHT FOREARM Performed at [facility], [address] [place], [place] [address]    Special Requests   Final    BOTTLES DRAWN AEROBIC AND ANAEROBIC Blood Culture adequate volume Performed at [facility], [address] [place], [place] [address]    Culture   Final    NO GROWTH 5 DAYS Performed at [facility], [address] [place], [place] [address]    Report Status 06/16/2023 FINAL  Final  Blood culture (routine x 2)     Status: None   Collection Time: 06/11/23  6:36 PM   Specimen: BLOOD RIGHT ARM  Result Value Ref Range Status   Specimen Description BLOOD RIGHT ARM  Final   Special Requests   Final    BOTTLES DRAWN AEROBIC ONLY Blood Culture adequate volume   Culture   Final    NO GROWTH 5 DAYS Performed at [facility], [address], [place], [place] [address]    Report Status 06/16/2023 FINAL  Final  Aerobic/Anaerobic Culture w Gram Stain (surgical/deep wound)     Status: None (Preliminary result)   Collection Time: 06/13/23  6:13 PM   Specimen: Synovial, Left Elbow; Body Fluid  Result Value Ref Range Status   Specimen Description   Final    SYNOVIAL  LEFT ELBOW Performed at [facility], [address], [place], [place] [address]    Special Requests   Final    NONE Performed at [facility], [address], [place], [place] [address]    Gram Stain NO WBC SEEN NO ORGANISMS SEEN   Final   Culture   Final    NO GROWTH 4 DAYS NO ANAEROBES ISOLATED; CULTURE IN PROGRESS FOR 5 DAYS Performed at [facility], [address], [place], [place] [address]    Report Status  PENDING  Incomplete  Aerobic/Anaerobic Culture w Gram Stain (surgical/deep wound)     Status: None (Preliminary result)   Collection Time: 06/13/23  8:22 PM   Specimen: Synovial, Left Elbow; Body Fluid  Result Value Ref Range Status   Specimen Description   Final    SYNOVIAL LEFT  ELBOW Performed at [facility], [address], [place], [place] [address]    Special Requests   Final    NONE Performed at [facility], [address], [place], [place] [address]    Gram Stain NO WBC SEEN NO ORGANISMS SEEN   Final   Culture   Final    NO GROWTH 4 DAYS NO ANAEROBES ISOLATED; CULTURE IN PROGRESS FOR 5 DAYS Performed at [facility], [address] [place], [place] [address]    Report Status PENDING  Incomplete  Aerobic/Anaerobic Culture w Gram Stain (surgical/deep wound)     Status: None (Preliminary result)   Collection Time: 06/13/23  8:22 PM   Specimen: Synovial, Left Elbow; Body Fluid  Result Value Ref Range Status   Specimen Description WOUND  Final   Special Requests LEFT ELBOW BURSA  Final   Gram Stain NO WBC SEEN NO ORGANISMS SEEN   Final   Culture   Final    NO GROWTH 4 DAYS NO ANAEROBES ISOLATED; CULTURE IN PROGRESS FOR 5 DAYS Performed at [facility], [address] [place], [place] [address]    Report Status PENDING  Incomplete     Radiology Studies: [place] EKG SITE RITE  Result Date: 06/17/2023 If Site Rite image not attached, placement could not be confirmed due to current cardiac rhythm.   Scheduled Meds:  celecoxib  100 mg Oral BID   docusate sodium  100 mg Oral BID   melatonin  3 mg Oral QHS   methocarbamol  500 mg Oral TID   pantoprazole  40 mg Oral Daily   Continuous Infusions:  sodium chloride 10 mL/hr at 06/18/23 1031   DAPTOmycin (CUBICIN) 750 mg in sodium chloride 0.9 % IVPB Stopped (06/17/23 1742)   methocarbamol (ROBAXIN) IV       LOS: 7 days   Time spent: 40 minutes  Carollee Herter, DO  [facility]  06/18/2023, 1:34 PM

## 2023-06-18 NOTE — Assessment & Plan Note (Addendum)
Pt admitted for septic bursitis of left elbow and left elbow septic arthritis. Underwent left elbow joint wash out and left elbow bursectomy.  Blood and intraoperative cultures are negative. ID consulted. Pt will be discharged on Daptomycin and IV Rocephin. PICC to be placed on day of discharge.   ID wants 6 weeks of Dapto and Rocephin.  Daptomycin 750 mg IV every 24 hours and Rocephin 2g IV every 24 hours  End Date: 07/25/23   Pt to followup with Dr. August Saucer in clinic 10 days postoperatively (06-23-2023)

## 2023-06-18 NOTE — Assessment & Plan Note (Signed)
Stable now. On RA.

## 2023-06-18 NOTE — Progress Notes (Addendum)
Peripherally Inserted Central Catheter Placement  The IV Nurse has discussed with the patient and/or persons authorized to consent for the patient, the purpose of this procedure and the potential benefits and risks involved with this procedure.  The benefits include less needle sticks, lab draws from the catheter, and the patient may be discharged home with the catheter. Risks include, but not limited to, infection, bleeding, blood clot (thrombus formation), and puncture of an artery; nerve damage and irregular heartbeat and possibility to perform a PICC exchange if needed/ordered by physician.  Alternatives to this procedure were also discussed.  Bard Power PICC patient education guide, fact sheet on infection prevention and patient information card has been provided to patient /or left at bedside. ECG image printed and placed on pt chart.    PICC Placement Documentation  PICC Single Lumen 06/29/2023 Right Basilic 43 cm 0 cm (Active)  Indication for Insertion or Continuance of Line Home intravenous therapies (PICC only) Jun 29, 2023 1600  Exposed Catheter (cm) 0 cm 06/29/23 1600  Site Assessment Clean, Dry, Intact June 29, 2023 1600  Line Status Flushed;Blood return noted;Heparin locked;Dead end cap in place 2023/06/29 1600  Dressing Type Transparent;Securing device June 29, 2023 1600  Dressing Status Antimicrobial disc in place;Clean, Dry, Intact 06-29-2023 1600  Line Care Connections checked and tightened June 29, 2023 1600  Line Adjustment (NICU/IV Team Only) No June 29, 2023 1600  Dressing Intervention New dressing Jun 29, 2023 1600  Dressing Change Due 06/25/23 06/29/23 1600       Keith Swanson  Keith Swanson 29-Jun-2023, 4:44 PM

## 2023-06-18 NOTE — Assessment & Plan Note (Signed)
Stable

## 2023-06-18 NOTE — Subjective & Objective (Signed)
Pt seen and examined. Pt's girlfriend Samantha at bedside. Pt states he is going home to live with his mother. PICC to be placed today. Home health orders already completed. CM to verify that home care company will deliver abx to pt's home. Pt's mother has been trained to give IV ABX.

## 2023-06-18 NOTE — Assessment & Plan Note (Signed)
See a/p for septic arthritis of elbow  Will go home with prn oxycodone for pain and celebrex.

## 2023-06-18 NOTE — Progress Notes (Signed)
Provided discharge education/instructions, all questions and concerns addressed. Pt is not in any distress, discharged home with all of his belongings.

## 2023-06-18 NOTE — Plan of Care (Signed)
?  Problem: Clinical Measurements: ?Goal: Will remain free from infection ?Outcome: Progressing ?  ?

## 2023-06-18 NOTE — Discharge Summary (Signed)
Physician Discharge Summary   Patient: Keith Swanson MRN: [id number] DOB: [birth year]  Admit date:     06/11/2023  Discharge date: 06/18/23  Discharge Physician: Carollee Herter   PCP: Meryl Crutch, MD   Recommendations at discharge:    F/U with Dr. August Saucer in ortho clinic 06-23-2023 Continue IV daptomycin 750 mg IV q24h, IV rocephin 2 g IV q24 h. End date 07-25-2023  Discharge Diagnoses: Principal Problem:   Septic arthritis of elbow, left (HCC) Active Problems:   Cellulitis of left elbow   Mild intermittent asthma with (acute) exacerbation   History of alcohol abuse   Transaminitis   Hypoalbuminemia   Tobacco use  Resolved Problems:   * No resolved hospital problems. *  Hospital Course: HPI: Keith Swanson is a 47 y.o. male with medical history significant of anaphylaxis, asthma, bronchitis, mild tobacco use, alcohol abuse in remission but occasionally has 1 or 2 drinks during the weekend, history of lower back pain who is presenting to the emergency department complaints of left elbow pain, erythema, calor and edema.  The pain and the decrease in ROM mating come to the emergency department.  He stated he has been having some tingling in his left hand.  He has felt mildly febrile and had a night sweat last night.  The patient stated that he went to donate plasma 2 days ago and they have problems with the needle and had to switch sides. He denied rhinorrhea, sore throat, wheezing or hemoptysis.  No chest pain, palpitations, diaphoresis, PND, orthopnea or pitting edema of the lower extremities.  No abdominal pain, nausea, emesis, diarrhea, constipation, melena or hematochezia.  No flank pain, dysuria, frequency or hematuria.  No polyuria, polydipsia, polyphagia or blurred vision.    Lab work: His CBC showed a white count of 10.6, hemoglobin 13.9 g/dL platelets 182.  CMP with normal electrolytes after calcium correction, normal glucose and renal function.  Total protein 6.2 and albumin 3.4 g/dL.  AST  42 and ALT 57 units/L.  Normal alk phos and total bilirubin.   Imaging: Left elbow x-ray with no acute fracture or dislocation.  There is a metallic 7 mm density overlying the dorsal soft tissues of the proximal ulna.  Recommend correlation with any history of remote trauma.  Soft tissue edema overlying the olecranon is no nonspecific but can be seen in the setting of olecranon bursitis.  Significant Events: Admitted 06/11/2023   Significant Labs: Blood and intra-operative cultures are negative  Significant Imaging Studies: MRI left elbow shows: 1. Prominent soft tissue swelling and fluid at the posterior aspect of the elbow and visualized left arm. There is a more well-defined fluid collection posterior to the olecranon process measuring approximately 4.2 x 1.2 x 3.4 cm. appearance favors cellulitis with olecranon bursitis, which may be septic. 2. Small elbow joint effusion. This is nonspecific and could be reactive. Septic arthritis is not entirely excluded. 3. No evidence of osteomyelitis.  Antibiotic Therapy: Anti-infectives (From admission, onward)    Start     Dose/Rate Route Frequency Ordered Stop   06/17/23 1530  DAPTOmycin (CUBICIN) 750 mg in sodium chloride 0.9 % IVPB        8 mg/kg  94.8 kg 130 mL/hr over 30 Minutes Intravenous Daily 06/17/23 1444     06/17/23 0000  daptomycin (CUBICIN) IVPB        750 mg Intravenous Every 24 hours 06/17/23 1452 07/25/23 2359   06/17/23 0000  cefTRIAXone (ROCEPHIN) IVPB  2 g Intravenous Every 24 hours 06/17/23 1452 07/25/23 2359   06/13/23 2030  vancomycin (VANCOCIN) IVPB 1000 mg/200 mL premix        1,000 mg 200 mL/hr over 60 Minutes Intravenous Every 12 hours 06/13/23 2028 06/13/23 2323   06/13/23 1852  vancomycin (VANCOCIN) powder  Status:  Discontinued          As needed 06/13/23 1853 06/13/23 2045   06/13/23 1400  ceFAZolin (ANCEF) IVPB 2g/100 mL premix        2 g 200 mL/hr over 30 Minutes Intravenous On call to O.R. 06/13/23 1347  06/13/23 1824   06/12/23 1000  cefTRIAXone (ROCEPHIN) 2 g in sodium chloride 0.9 % 100 mL IVPB        2 g 200 mL/hr over 30 Minutes Intravenous Every 24 hours 06/11/23 1700 06/18/23 0958   06/11/23 1615  cefTRIAXone (ROCEPHIN) 2 g in sodium chloride 0.9 % 100 mL IVPB        2 g 200 mL/hr over 30 Minutes Intravenous  Once 06/11/23 1608 06/11/23 1725       Procedures: 9/9 Left elbow olecranon bursa excision with excisional debridement as well as arthrotomy of the elbow with irrigation and washout.   Consultants: Orthopedics ID   Assessment and Plan: * Septic arthritis of elbow, left (HCC) Pt admitted for septic bursitis of left elbow and left elbow septic arthritis. Underwent left elbow joint wash out and left elbow bursectomy.  Blood and intraoperative cultures are negative. ID consulted. Pt will be discharged on Daptomycin and IV Rocephin. PICC to be placed on day of discharge.   ID wants 6 weeks of Dapto and Rocephin.  Daptomycin 750 mg IV every 24 hours and Rocephin 2g IV every 24 hours  End Date: 07/25/23   Pt to followup with Dr. August Saucer in clinic 10 days postoperatively (06-23-2023)  Cellulitis of left elbow See a/p for septic arthritis of elbow  Will go home with prn oxycodone for pain and celebrex.  Tobacco use Stable.  Hypoalbuminemia Stable.  Transaminitis Resolved. Pt recently discharge from alcohol rehab.  History of alcohol abuse Pt recently released from alcohol rehab. Pt is going home to live with his mother.  Mild intermittent asthma with (acute) exacerbation Stable now. On RA.        Pain control - [employer] Controlled Substance Reporting System database was reviewed. and patient was instructed, not to drive, operate heavy machinery, perform activities at heights, swimming or participation in water activities or provide baby-sitting services while on Pain, Sleep and Anxiety Medications; until their outpatient Physician has advised to do so  again. Also recommended to not to take more than prescribed Pain, Sleep and Anxiety Medications.  Consultants: Orthopedics(Dean), ID Procedures performed: left elbow washout and debridement  Disposition: Home Diet recommendation:  Discharge Diet Orders (From admission, onward)     Start     Ordered   06/18/23 0000  Diet - low sodium heart healthy        06/18/23 1341           Regular diet DISCHARGE MEDICATION: Allergies as of 06/18/2023       Reactions   Penicillins Swelling   Did it involve swelling of the face/tongue/throat, SOB, or low BP? Yes Did it involve sudden or severe rash/hives, skin peeling, or any reaction on the inside of your mouth or nose? No Did you need to seek medical attention at a hospital or doctor's office? No When did it last happen?  childhood  If all above answers are "NO", may proceed with cephalosporin use.   Doxycycline Hives        Medication List     STOP taking these medications    sertraline 50 MG tablet Commonly known as: ZOLOFT       TAKE these medications    cefTRIAXone IVPB Commonly known as: ROCEPHIN Inject 2 g into the vein daily. Indication:  L-elbow septic arthritis First Dose: Yes Last Day of Therapy:  07/25/23 Labs - Once weekly:  CBC/D and BMP, Labs - Once weekly: ESR and CRP Method of administration: IV Push Method of administration may be changed at the discretion of home infusion pharmacist based upon assessment of the patient and/or caregiver's ability to self-administer the medication ordered.   celecoxib 100 MG capsule Commonly known as: CELEBREX Take 1 capsule (100 mg total) by mouth 2 (two) times daily.   daptomycin IVPB Commonly known as: CUBICIN Inject 750 mg into the vein daily. Indication:  L-elbow septic arthritis First Dose: Yes Last Day of Therapy:  07/25/23 Labs - Once weekly:  CBC/D, BMP, and CPK Labs - Once weekly: ESR and CRP Method of administration: IV Push Method of administration  may be changed at the discretion of home infusion pharmacist based upon assessment of the patient and/or caregiver's ability to self-administer the medication ordered.   methocarbamol 500 MG tablet Commonly known as: ROBAXIN Take 1 tablet (500 mg total) by mouth 3 (three) times daily for 7 days.   ondansetron 4 MG tablet Commonly known as: ZOFRAN Take 1 tablet (4 mg total) by mouth every 6 (six) hours as needed for nausea.   oxyCODONE 5 MG immediate release tablet Commonly known as: Oxy IR/ROXICODONE Take 1 tablet (5 mg total) by mouth every 6 (six) hours as needed for up to 7 days for moderate pain.   pantoprazole 40 MG tablet Commonly known as: PROTONIX Take 1 tablet (40 mg total) by mouth daily.               Discharge Care Instructions  (From admission, onward)           Start     Ordered   06/18/23 0000  Leave dressing on - Keep it clean, dry, and intact until clinic visit        06/18/23 1341   06/17/23 0000  Change dressing on IV access line weekly and PRN  (Home infusion instructions - [facility] )        06/17/23 1452            Discharge Exam: Filed Weights   06/11/23 1800 06/13/23 1359  Weight: 94.8 kg 94.8 kg   Condition at discharge: stable  The results of significant diagnostics from this hospitalization (including imaging, microbiology, ancillary and laboratory) are listed below for reference.   Imaging Studies: [place] EKG SITE RITE  Result Date: 06/17/2023 If Site Rite image not attached, placement could not be confirmed due to current cardiac rhythm.  CT MAXILLOFACIAL W CONTRAST  Result Date: 06/13/2023 CLINICAL DATA:  Left facial swelling EXAM: CT MAXILLOFACIAL WITH CONTRAST TECHNIQUE: Multidetector CT imaging of the maxillofacial structures was performed with intravenous contrast. Multiplanar CT image reconstructions were also generated. RADIATION DOSE REDUCTION: This exam was performed according to the departmental  dose-optimization program which includes automated exposure control, adjustment of the mA and/or kV according to patient size and/or use of iterative reconstruction technique. CONTRAST:  80mL OMNIPAQUE IOHEXOL 300 MG/ML  SOLN COMPARISON:  None  Available. FINDINGS: Osseous: No fracture or mandibular dislocation. No destructive process. Orbits: Negative. No traumatic or inflammatory finding. Sinuses: Mild maxillary and ethmoid sinus mucosal thickening. Retained secretions in the sphenoid sinus. Soft tissues: No sialolithiasis. No focal abnormality of the left facial soft tissues. No abscess or fluid collection. Limited intracranial: No significant or unexpected finding. IMPRESSION: 1. No focal abnormality of the left facial soft tissues. No abscess or fluid collection. 2. Mild paranasal sinus disease. Electronically Signed   By: Deatra Robinson M.D.   On: 06/13/2023 22:47   MR ELBOW LEFT WO CONTRAST  Result Date: 06/12/2023 CLINICAL DATA:  Septic arthritis suspected, elbow, xray done left elbow swelling EXAM: MRI OF THE LEFT ELBOW WITHOUT CONTRAST TECHNIQUE: Multiplanar, multisequence MR imaging of the elbow was performed. No intravenous contrast was administered. COMPARISON:  X-ray 06/11/2023 FINDINGS: Technical Note: Despite efforts by the technologist and patient, motion artifact is present on today's exam and could not be eliminated. This reduces exam sensitivity and specificity. Bones/Joint/Cartilage No acute fracture. No dislocation. No bone marrow edema or periostitis. No erosions. Small elbow joint effusion. Ligaments Medial and lateral elbow ligaments appear intact. Muscles and Tendons No acute musculotendinous abnormality. Soft tissues Prominent soft tissue swelling and fluid at the posterior aspect of the elbow and visualized left arm. There is a more well-defined fluid collection posterior to the olecranon process measuring approximately 4.2 x 1.2 x 3.4 cm. IMPRESSION: 1. Prominent soft tissue swelling  and fluid at the posterior aspect of the elbow and visualized left arm. There is a more well-defined fluid collection posterior to the olecranon process measuring approximately 4.2 x 1.2 x 3.4 cm. Appearance favors cellulitis with olecranon bursitis, which may be septic. 2. Small elbow joint effusion. This is nonspecific and could be reactive. Septic arthritis is not entirely excluded. 3. No evidence of osteomyelitis. Electronically Signed   By: Duanne Guess D.O.   On: 06/12/2023 15:38   UE Venous Duplex ([facility] and [facility] ONLY)  Result Date: 06/12/2023 UPPER VENOUS STUDY  Patient Name:  Keith Swanson  Date of Exam:   06/11/2023 Medical Rec #: [id number]    Accession #:    [id number] Date of Birth: [birth year]    Patient Gender: M Patient Age:   62 years Exam Location:  [facility] Procedure:      VAS [place] UPPER EXTREMITY VENOUS DUPLEX Referring Phys: Cheron Schaumann --------------------------------------------------------------------------------  Indications: Pain Performing Technologist: Chanda Busing RVT  Examination Guidelines: A complete evaluation includes B-mode imaging, spectral Doppler, color Doppler, and power Doppler as needed of all accessible portions of each vessel. Bilateral testing is considered an integral part of a complete examination. Limited examinations for reoccurring indications may be performed as noted.  Right Findings: +----------+------------+---------+-----------+----------+-------+ RIGHT     CompressiblePhasicitySpontaneousPropertiesSummary +----------+------------+---------+-----------+----------+-------+ Subclavian    Full       Yes       Yes                      +----------+------------+---------+-----------+----------+-------+  Left Findings: +----------+------------+---------+-----------+----------+-------+ LEFT      CompressiblePhasicitySpontaneousPropertiesSummary +----------+------------+---------+-----------+----------+-------+ IJV           Full       Yes        Yes                      +----------+------------+---------+-----------+----------+-------+ Subclavian    Full       Yes       Yes                      +----------+------------+---------+-----------+----------+-------+  Axillary      Full       Yes       Yes                      +----------+------------+---------+-----------+----------+-------+ Brachial      Full                                          +----------+------------+---------+-----------+----------+-------+ Radial        Full                                          +----------+------------+---------+-----------+----------+-------+ Ulnar         Full                                          +----------+------------+---------+-----------+----------+-------+ Cephalic      Full                                          +----------+------------+---------+-----------+----------+-------+ Basilic       Full                                          +----------+------------+---------+-----------+----------+-------+  Summary:  Right: No evidence of thrombosis in the subclavian.  Left: No evidence of deep vein thrombosis in the upper extremity. No evidence of superficial vein thrombosis in the upper extremity.  *See table(s) above for measurements and observations.  Diagnosing physician: Heath Lark Electronically signed by Heath Lark on 06/12/2023 at 11:03:48 AM.    Final    US Abdomen Limited RUQ (LIVER/GB)  Result Date: 06/12/2023 CLINICAL DATA:  47 year old male with history of transaminitis. EXAM: ULTRASOUND ABDOMEN LIMITED RIGHT UPPER QUADRANT COMPARISON:  No priors. FINDINGS: Gallbladder: No gallstones or wall thickening visualized. No sonographic Murphy sign noted by sonographer. Common bile duct: Diameter: 5.4 mm Liver: No focal lesion identified. Within normal limits in parenchymal echogenicity. Portal vein is patent on color Doppler imaging with normal direction of blood flow towards the  liver. Other: None. IMPRESSION: 1. No acute findings. Specifically, no gallstones and no evidence of acute cholecystitis. Electronically Signed   By: Trudie Reed M.D.   On: 06/12/2023 09:03   DG Elbow Complete Left  Result Date: 06/11/2023 CLINICAL DATA:  pain EXAM: LEFT ELBOW - COMPLETE 3+ VIEW COMPARISON:  None Available. FINDINGS: No acute fracture or dislocation. Joint spaces and alignment are maintained. Enthesopathic changes of the of the olecranon. No area of erosion or osseous destruction. Metallic 7 by 2 x 2 mm rectangular density overlying the dorsal soft tissues of the proximal ulna. Soft tissue edema overlying the olecranon. IMPRESSION: 1. No acute fracture or dislocation. 2. Metallic 7 mm density overlying the dorsal soft tissues of the proximal ulna. Recommend correlation with any history of remote trauma. 3. Soft tissue edema overlying the olecranon is nonspecific but can be seen in the setting of olecranon bursitis. Electronically Signed   By: Meda Klinefelter M.D.  On: 06/11/2023 15:38    Microbiology: Results for orders placed or performed during the hospital encounter of 06/11/23  Blood culture (routine x 2)     Status: None   Collection Time: 06/11/23  2:42 PM   Specimen: BLOOD RIGHT FOREARM  Result Value Ref Range Status   Specimen Description   Final    BLOOD RIGHT FOREARM Performed at [facility], [address], [place], [place] [address]    Special Requests   Final    BOTTLES DRAWN AEROBIC AND ANAEROBIC Blood Culture adequate volume Performed at [facility], [address] [place], [place] [address]    Culture   Final    NO GROWTH 5 DAYS Performed at [facility], [address], [place], [place] [address]    Report Status 06/16/2023 FINAL  Final  Blood culture (routine x 2)     Status: None   Collection Time: 06/11/23  6:36 PM   Specimen: BLOOD RIGHT ARM  Result Value Ref Range Status   Specimen Description BLOOD RIGHT  ARM  Final   Special Requests   Final    BOTTLES DRAWN AEROBIC ONLY Blood Culture adequate volume   Culture   Final    NO GROWTH 5 DAYS Performed at [facility], [address] [place], [place] [address]    Report Status 06/16/2023 FINAL  Final  Aerobic/Anaerobic Culture w Gram Stain (surgical/deep wound)     Status: None (Preliminary result)   Collection Time: 06/13/23  6:13 PM   Specimen: Synovial, Left Elbow; Body Fluid  Result Value Ref Range Status   Specimen Description   Final    SYNOVIAL  LEFT ELBOW Performed at [facility], [address], [place], [place] [address]    Special Requests   Final    NONE Performed at [facility], [address], [place], [place] [address]    Gram Stain NO WBC SEEN NO ORGANISMS SEEN   Final   Culture   Final    NO GROWTH 4 DAYS NO ANAEROBES ISOLATED; CULTURE IN PROGRESS FOR 5 DAYS Performed at [facility], [address] [place], [place] [address]    Report Status PENDING  Incomplete  Aerobic/Anaerobic Culture w Gram Stain (surgical/deep wound)     Status: None (Preliminary result)   Collection Time: 06/13/23  8:22 PM   Specimen: Synovial, Left Elbow; Body Fluid  Result Value Ref Range Status   Specimen Description   Final    SYNOVIAL LEFT ELBOW Performed at [facility], [address], [place], [place] [address]    Special Requests   Final    NONE Performed at [facility], [address], [place], [place] [address]    Gram Stain NO WBC SEEN NO ORGANISMS SEEN   Final   Culture   Final    NO GROWTH 4 DAYS NO ANAEROBES ISOLATED; CULTURE IN PROGRESS FOR 5 DAYS Performed at [facility], [address], [place], [place] [address]    Report Status PENDING  Incomplete  Aerobic/Anaerobic Culture w Gram Stain (surgical/deep wound)     Status: None (Preliminary result)   Collection Time: 06/13/23  8:22 PM   Specimen: Synovial, Left  Elbow; Body Fluid  Result Value Ref Range Status   Specimen Description WOUND  Final   Special Requests LEFT ELBOW BURSA  Final   Gram Stain NO WBC SEEN NO ORGANISMS SEEN  Final   Culture   Final    NO GROWTH 4 DAYS NO ANAEROBES ISOLATED; CULTURE IN PROGRESS FOR 5 DAYS Performed at [facility], [address], [place], [place] [address]    Report Status PENDING  Incomplete    Labs: CBC: Recent Labs  Lab 06/13/23 0259 06/14/23 0333 06/15/23 0336 06/16/23 0326 06/18/23 0404  WBC 11.4* 14.1* 16.2* 11.3* 11.0*  NEUTROABS 6.6 12.7* 11.9* 6.1 5.7  HGB 13.2 13.7 12.2* 12.0* 12.3*  HCT 39.4 40.6 36.9* 36.9* 37.3*  MCV 87.6 88.8 89.3 90.4 89.2  PLT 196 206 196 204 244   Basic Metabolic Panel: Recent Labs  Lab 06/12/23 0258 06/13/23 0259 06/14/23 0333 06/15/23 0336 06/16/23 0326  NA 136 135 136 136 137  K 3.7 3.8 4.4 3.8 3.9  CL 102 102 102 104 103  CO2 26 25 24 23 24   GLUCOSE 132* 117* 221* 109* 103*  BUN 13 16 15 13 13   CREATININE 0.83 0.91 0.94 0.81 0.79  CALCIUM 8.3* 8.3* 8.7* 8.3* 8.7*   Liver Function Tests: Recent Labs  Lab 06/11/23 1434 06/12/23 0258 06/16/23 0326  AST 42* 34 27  ALT 57* 48* 38  ALKPHOS 54 59 56  BILITOT 0.8 0.8 0.5  PROT 6.2* 5.9* 6.1*  ALBUMIN 3.4* 3.1* 3.1*    Discharge time spent: greater than 30 minutes.  Signed: Carollee Herter, DO [facility] 06/18/2023

## 2023-06-18 NOTE — TOC Transition Note (Signed)
Transition of Care [facility]) - CM/SW Discharge Note   Patient Details  Name: Keith Swanson MRN: [id number] Date of Birth: 20-Aug-1976  Transition of Care [facility]) CM/SW Contact:  Adrian Prows, RN Phone Number: 06/18/2023, 4:30 PM   Clinical Narrative:    D/C orders received; per Kathee Polite, RN pt's PICC is being placed and pt received doses of Rocephin and Dapto today; Pam at [facility] notified; she says medication will be delivered to pt's d/c address this evening; no TOC needs.   Final next level of care: Home/Self Care Barriers to Discharge: No Barriers Identified   Patient Goals and CMS Choice      Discharge Placement                         Discharge Plan and Services Additional resources added to the After Visit Summary for                                       Social Determinants of Health (SDOH) Interventions SDOH Screenings   Food Insecurity: No Food Insecurity (06/11/2023)  Housing: Low Risk  (06/11/2023)  Transportation Needs: No Transportation Needs (06/11/2023)  Utilities: Not At Risk (06/11/2023)  Tobacco Use: Medium Risk (06/13/2023)     Readmission Risk Interventions    06/15/2023    1:36 PM  Readmission Risk Prevention Plan  Post Dischage Appt Complete  Medication Screening Complete  Transportation Screening Complete

## 2023-06-18 NOTE — TOC Progression Note (Signed)
Transition of Care [facility]) - Progression Note    Patient Details  Name: Keith Swanson MRN: [id number] Date of Birth: [birth year]  [facility] CM/SW Contact  Adrian Prows, RN Phone Number: 06/18/2023, 1:19 PM  Clinical Narrative:    Notified IV team will place PICC at 3PM today; Jeri Modena at [facility] notified; she says pt will need to receive dose of both antibiotics before d/c; she also says plan for 6PM delivery of meds; Dr Imogene Burn and Kathee Polite, RN notified via secure chat.        Expected Discharge Plan and Services                                               Social Determinants of Health (SDOH) Interventions SDOH Screenings   Food Insecurity: No Food Insecurity (06/11/2023)  Housing: Low Risk  (06/11/2023)  Transportation Needs: No Transportation Needs (06/11/2023)  Utilities: Not At Risk (06/11/2023)  Tobacco Use: Medium Risk (06/13/2023)    Readmission Risk Interventions    06/15/2023    1:36 PM  Readmission Risk Prevention Plan  Post Dischage Appt Complete  Medication Screening Complete  Transportation Screening Complete

## 2023-06-18 NOTE — Plan of Care (Signed)
  Problem: Clinical Measurements: Goal: Will remain free from infection Outcome: Progressing   Problem: Education: Goal: Knowledge of General Education information will improve Description: Including pain rating scale, medication(s)/side effects and non-pharmacologic comfort measures Outcome: Progressing   Problem: Health Behavior/Discharge Planning: Goal: Ability to manage health-related needs will improve Outcome: Progressing   Problem: Activity: Goal: Risk for activity intolerance will decrease Outcome: Progressing   Problem: Nutrition: Goal: Adequate nutrition will be maintained Outcome: Progressing   Problem: Coping: Goal: Level of anxiety will decrease Outcome: Progressing   Problem: Elimination: Goal: Will not experience complications related to bowel motility Outcome: Progressing   Problem: Pain Managment: Goal: General experience of comfort will improve Outcome: Progressing   Problem: Safety: Goal: Ability to remain free from injury will improve Outcome: Progressing   Problem: Skin Integrity: Goal: Risk for impaired skin integrity will decrease Outcome: Progressing

## 2023-06-18 NOTE — Assessment & Plan Note (Signed)
Pt recently released from alcohol rehab. Pt is going home to live with his mother.

## 2023-06-19 DIAGNOSIS — M009 Pyogenic arthritis, unspecified: Secondary | ICD-10-CM | POA: Diagnosis not present

## 2023-06-19 LAB — AEROBIC/ANAEROBIC CULTURE W GRAM STAIN (SURGICAL/DEEP WOUND): Gram Stain: NONE SEEN

## 2023-06-20 ENCOUNTER — Telehealth: Payer: Self-pay | Admitting: Infectious Disease

## 2023-06-20 ENCOUNTER — Telehealth: Payer: Self-pay

## 2023-06-20 DIAGNOSIS — M009 Pyogenic arthritis, unspecified: Secondary | ICD-10-CM | POA: Diagnosis not present

## 2023-06-20 DIAGNOSIS — M00822 Arthritis due to other bacteria, left elbow: Secondary | ICD-10-CM | POA: Diagnosis not present

## 2023-06-20 NOTE — Telephone Encounter (Signed)
Patient called wanting to know if there was anything that he needed to do with his left elbow until his F/U visit on 06/27/2023.  Cb# [phone].  Patient had left elbow surgery on 06/13/2023.  Please advise.  Thank you.

## 2023-06-20 NOTE — Telephone Encounter (Signed)
Dr. Daiva Eves can you update opat

## 2023-06-20 NOTE — Telephone Encounter (Signed)
No just remain in sling, no lifting

## 2023-06-20 NOTE — Telephone Encounter (Signed)
Patient grew methicillin sensitive Staph epidermidis from his operative cultures.  Can we have home health changes antibiotics to cefazolin 2 g IV every 8 hours with the stop date that we have for his prior antibiotic still in place we can then get rid of his daptomycin and ceftriaxone

## 2023-06-20 NOTE — Telephone Encounter (Signed)
L-elbow septic arthritis    Culture Result: MSSE  Discharge antibiotics to be given via PICC line Discharge antibiotics: Thousand 2 g IV every 8 hours Duration: 6 weeks End Date:   07/25/23    [facility] Care Per Protocol:   Home health RN for IV administration and teaching; PICC line care and labs.     Labs weekly while on IV antibiotics: _x_ CBC with differential _x_ BMP __ CMP _x_ CRP _x_ ESR  _x_ Please pull PIC at completion of IV antibiotics __ Please leave PIC in place until doctor has seen patient or been notified   Fax weekly labs to [phone]     Keith Swanson has an appointment on 07/19/2023 at 9AM with Dr. Elinor Parkinson at   [facility], which  is located in the [facility] at   [address] in [place].   [address], which is located to the left of the elevators.   Phone: [phone]   Fax: [phone]   [email or web address]   The patient should arrive 30 minutes prior to their appoitment

## 2023-06-20 NOTE — Telephone Encounter (Signed)
Patient informed of culture results. Patient and his mother also informed of medication change. Patient verbalized understanding. I have updated [facility] and our pharmacy change with medication change. Patient will hold IV abx today Tarae Wooden T Pricilla Loveless

## 2023-06-21 DIAGNOSIS — M009 Pyogenic arthritis, unspecified: Secondary | ICD-10-CM | POA: Diagnosis not present

## 2023-06-21 NOTE — Telephone Encounter (Signed)
Called and advised. Pt stated understanding

## 2023-06-27 ENCOUNTER — Ambulatory Visit (INDEPENDENT_AMBULATORY_CARE_PROVIDER_SITE_OTHER): Payer: Medicaid Other | Admitting: Surgical

## 2023-06-27 ENCOUNTER — Encounter: Payer: Self-pay | Admitting: Surgical

## 2023-06-27 DIAGNOSIS — M71122 Other infective bursitis, left elbow: Secondary | ICD-10-CM

## 2023-06-27 NOTE — Progress Notes (Signed)
Post-Op Visit Note   Patient: Keith Swanson           Date of Birth: September 26, 1976           MRN: [id number] Visit Date: 06/27/2023 PCP: Meryl Crutch, MD   Assessment & Plan:  Chief Complaint: No chief complaint on file.  Visit Diagnoses:  1. Septic olecranon bursitis of left elbow     Plan: Patient is a 47 year old male who presents s/p left elbow I&D of the elbow joint and bursa with bursectomy.  Patient returns about 2 weeks out from surgery.  He denies any fevers or chills consistently.  He has PICC line in and is receiving cephalexin every 8 hours through the PICC line.  He does not currently have follow-up with infectious disease.  He states that he is planning to call them in the next couple days in order to set up appointment.  Feels good to be out of the splint.  The cultures came back showing Staphylococcus epidermidis that is pansensitive.  On exam, patient has intact EPL, finger abduction, grip strength.  FPL appears to be intact though it is difficult to say because patient lacks thumb flexion from prior injury.  Intact wrist extension and pronation/supination.  Patient has elbow extension passively to about 2 degrees.  Also has passive elbow flexion to about 110 degrees.  Incision looks to be healing well with sutures intact.  Sutures removed every other suture at first and there is no significant dehiscence of the incision so every suture was removed today.  Steri-Strips placed.  Plan is no lifting with the operative extremity.  Recommended he set up appointment to see infectious disease.  He will start some gentle range of motion exercises primarily to focus on achieving full elbow extension.  Follow-up in 3 weeks for clinical recheck regarding his incision.  He sounds like he will have 4-5 more weeks of antibiotics through the PICC line and hold off on return to work until the PICC line is out.  Follow-Up Instructions: No follow-ups on file.   Orders:  No orders of the  defined types were placed in this encounter.  No orders of the defined types were placed in this encounter.   Imaging: No results found.  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    Past Surgical History:  Procedure Laterality Date   FACIAL FRACTURE SURGERY     I & D EXTREMITY Left 06/13/2023   Procedure: IRRIGATION AND DEBRIDEMENT ELBOW, Bursa and Joint;  Surgeon: Cammy Copa, MD;  Location: [facility];  Service: Orthopedics;  Laterality: Left;   WISDOM TOOTH EXTRACTION  Social History   Occupational History   Not on file  Tobacco Use   Smoking status: Former    Current packs/day: 0.50    Types: Cigarettes   Smokeless tobacco: Never  Vaping Use   Vaping status: Never Used  Substance and Sexual Activity   Alcohol use: Not Currently   Drug use: No   Sexual activity: Not on file

## 2023-06-28 DIAGNOSIS — Z0189 Encounter for other specified special examinations: Secondary | ICD-10-CM | POA: Diagnosis not present

## 2023-06-28 DIAGNOSIS — A4901 Methicillin susceptible Staphylococcus aureus infection, unspecified site: Secondary | ICD-10-CM | POA: Diagnosis not present

## 2023-06-28 IMAGING — CR DG CHEST 2V
2 series · 2 of 2 positions shown · non-contrast
Comparison: 04/07/2021

CLINICAL DATA: Shortness of breath

EXAM:
CHEST - 2 VIEW

[w chest lat]
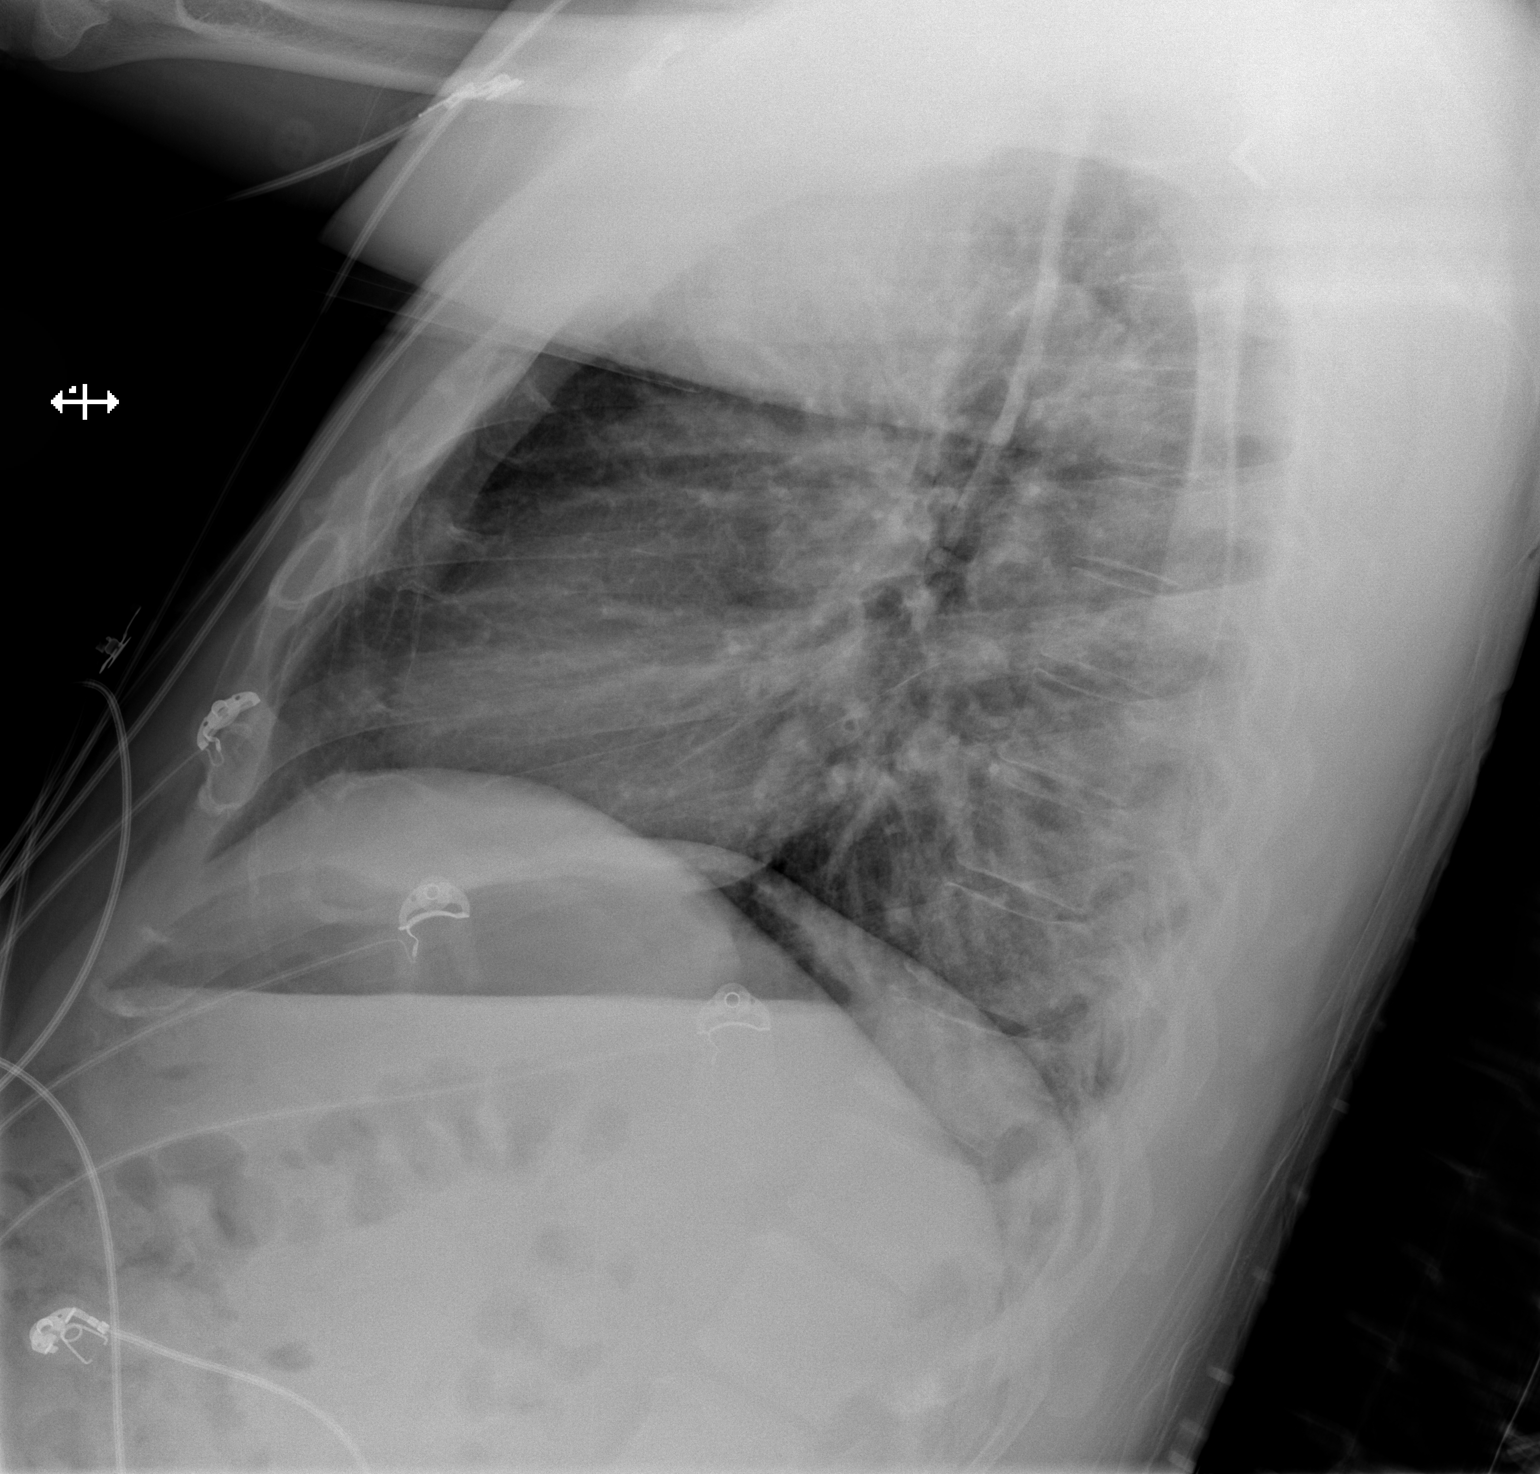

[x chest ap]
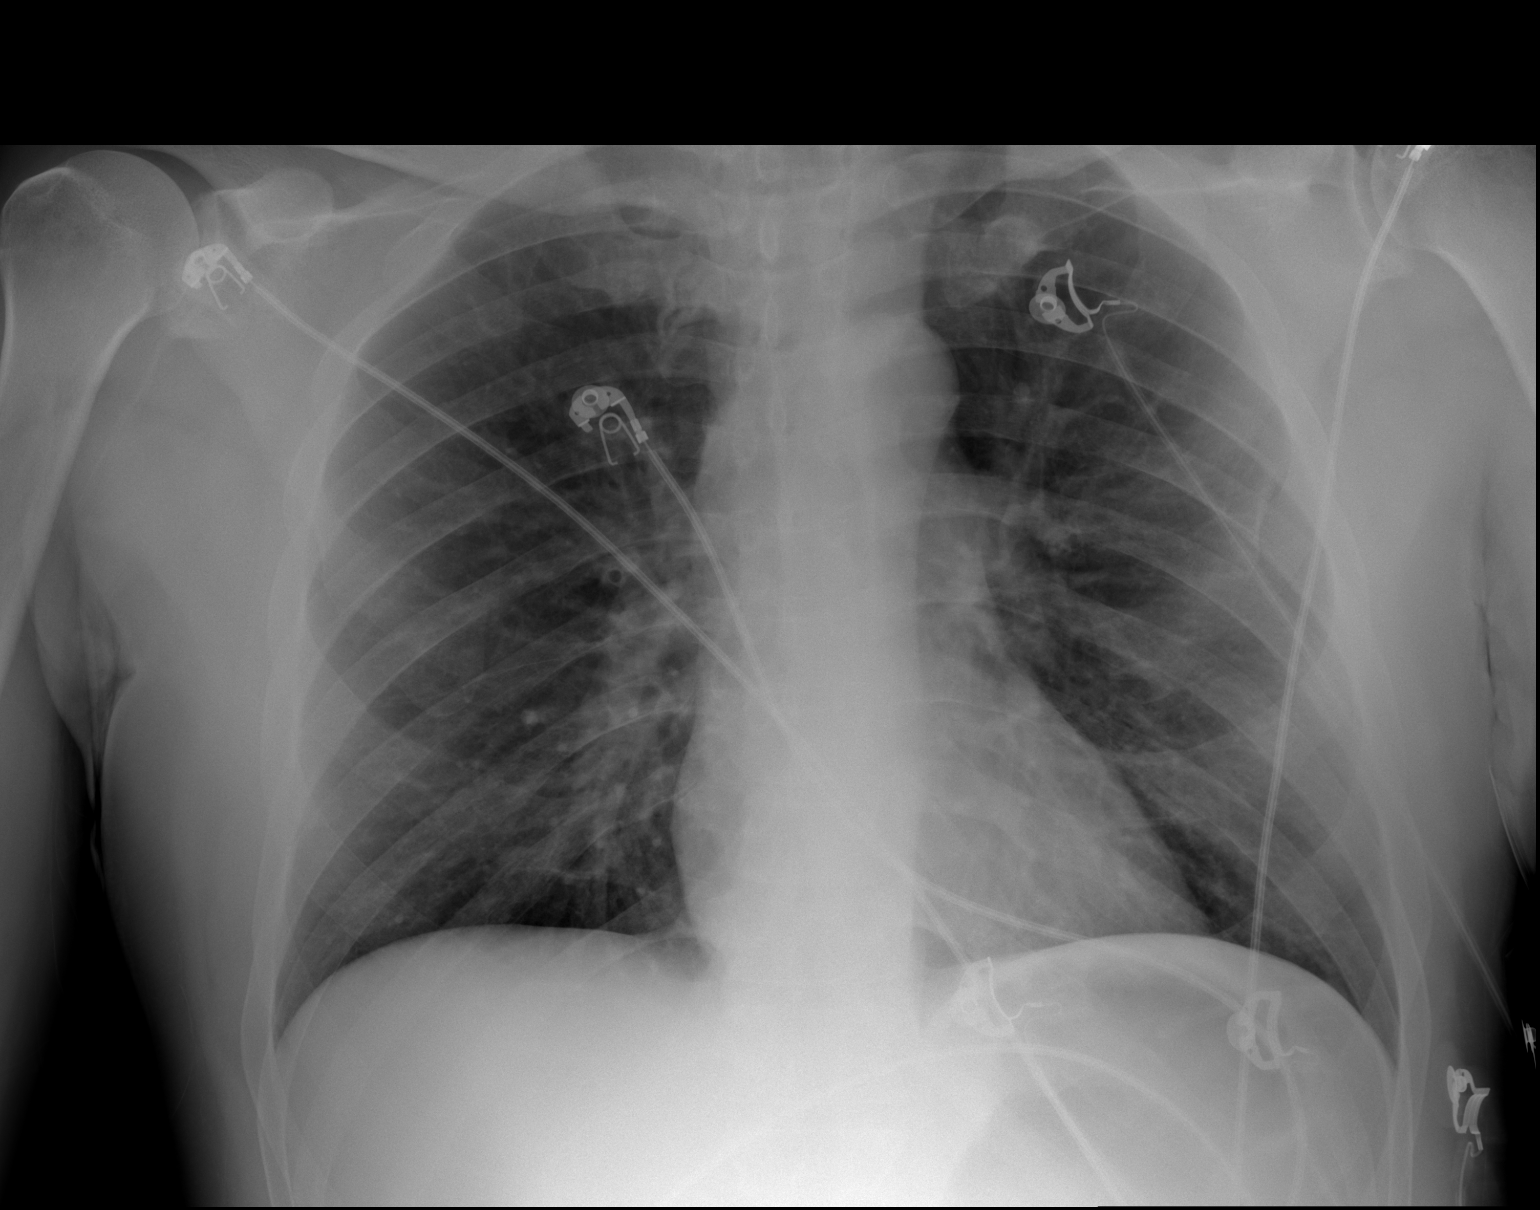

[2 of 2 positions shown; findings below may reference images not displayed]

FINDINGS: Lungs are clear.  No pleural effusion or pneumothorax.

The heart is normal in size.

Visualized osseous structures are within normal limits.
IMPRESSION: Normal chest radiographs.

## 2023-06-29 ENCOUNTER — Other Ambulatory Visit: Payer: Self-pay | Admitting: Surgical

## 2023-06-29 ENCOUNTER — Telehealth: Payer: Self-pay | Admitting: Surgical

## 2023-06-29 MED ORDER — OXYCODONE HCL 5 MG PO TABS: 5.0000 mg | ORAL_TABLET | Freq: Three times a day (TID) | ORAL | 0 refills | Status: DC | PRN

## 2023-06-29 MED ORDER — CELECOXIB 100 MG PO CAPS: 100.0000 mg | ORAL_CAPSULE | Freq: Two times a day (BID) | ORAL | 0 refills | Status: AC

## 2023-06-29 NOTE — Telephone Encounter (Signed)
Pt called requesting a refill of oxycodone and celecoxib. Please send to [facility] [place]. Please call pt when sent to pharmacy. Pt phone number is [phone].

## 2023-06-29 NOTE — Telephone Encounter (Signed)
Called and advised.

## 2023-06-29 NOTE — Telephone Encounter (Signed)
Refilled meds

## 2023-06-30 DIAGNOSIS — M009 Pyogenic arthritis, unspecified: Secondary | ICD-10-CM | POA: Diagnosis not present

## 2023-07-05 ENCOUNTER — Telehealth: Payer: Self-pay

## 2023-07-05 NOTE — Telephone Encounter (Signed)
Ariel called back, she had to leave a voicemail for patient's mother. Provided her with contact info for [facility] (other emergency contact).   Sandie Ano, RN

## 2023-07-05 NOTE — Telephone Encounter (Signed)
Received voicemail from [employer] with [facility] stating they have been unable to get in touch with the patient regarding IV antibiotic delivery.   Called Sten, no answer.   Called Ariel back and provided her with contact info for his mother as it appears she's been somewhat involved in his care. Asked Ariel to please let us know if she's still unable to get in touch with the patient.   Ariel [phone]  Sandie Ano, RN

## 2023-07-06 DIAGNOSIS — M009 Pyogenic arthritis, unspecified: Secondary | ICD-10-CM | POA: Diagnosis not present

## 2023-07-06 DIAGNOSIS — M00829 Arthritis due to other bacteria, unspecified elbow: Secondary | ICD-10-CM | POA: Diagnosis not present

## 2023-07-08 DIAGNOSIS — M009 Pyogenic arthritis, unspecified: Secondary | ICD-10-CM | POA: Diagnosis not present

## 2023-07-11 DIAGNOSIS — M009 Pyogenic arthritis, unspecified: Secondary | ICD-10-CM | POA: Diagnosis not present

## 2023-07-13 ENCOUNTER — Telehealth: Payer: Self-pay

## 2023-07-13 DIAGNOSIS — L03114 Cellulitis of left upper limb: Secondary | ICD-10-CM

## 2023-07-13 NOTE — Telephone Encounter (Signed)
Received the following message from Jeri Modena, RN with [facility]:  MIA Received: Today Keith Bora, LPN; Pricilla Loveless, Diminique T, CMA; Juanita Laster, RMA; Jeremiah Curci, Ellender Hose, RN; Jola Schmidt M, CMA; 1 other Hello all.  [name] has left [place] to "visit" in [place]. This is the second or third time this has happened but he has surfaced and come back for PICC care/labs. This week the RN has been texting him calling and no response. Per his mother he is off to see girlfriend in [place] but she nor the RN can make contact.  Just keeping you in the loop.  No PICC care or labs this week.  Pam

## 2023-07-13 NOTE — Telephone Encounter (Signed)
Sent Dr. Zenaida Niece Dam's orders to Jeri Modena, RN with [facility]. Asked that they notify us when they make contact with the patient or have him call us. Told Pam about his appointment on 10/15.  Sandie Ano, RN

## 2023-07-14 NOTE — Telephone Encounter (Addendum)
Spoke with Clent Ridges and notified him of the plan to have PICC line removed. He is not in the [place] area and won't be back until Monday 10/14. States he missed a day or two of IV antibiotics total. Provided him with appointment information.   If Zyvox needs to be sent in prior to PICC removal he is using the [facility] in [place].   Sandie Ano, RN

## 2023-07-14 NOTE — Telephone Encounter (Signed)
Received the following message from Jeri Modena, RN with [facility]:  Placed on HOLD/Holding shipment Received: Today Jamesetta So, RN [place] [facility].  Mr. Maclin called yesterday to provide an address for shipment of meds to an address in [place].  Based on our exchange yesterday to stop IVABX, pull PICC, we have held his delivery.  His HHRN is attempting to make contact as she has been for days.  I just wanted to let you know of the held delivery in case you hear from him. We will pull the PICC if/when we make contact.    Thanks. Pam

## 2023-07-15 MED ORDER — LINEZOLID 600 MG PO TABS
600.0000 mg | ORAL_TABLET | Freq: Two times a day (BID) | ORAL | 0 refills | Status: DC
Start: 2023-07-15 — End: 2023-08-10

## 2023-07-15 NOTE — Telephone Encounter (Signed)
Called Keith Swanson to let him know oral antibiotics have been sent to his requested pharmacy, no answer. Left voicemail stating oral antibiotics were sent and that he should wait to start them until his PICC line is removed on Monday. Left call back number for any questions.   Sandie Ano, RN

## 2023-07-15 NOTE — Addendum Note (Signed)
Addended by: Linna Hoff D on: 07/15/2023 08:12 AM   Modules accepted: Orders

## 2023-07-18 ENCOUNTER — Ambulatory Visit: Payer: Medicaid Other | Admitting: Surgical

## 2023-07-18 ENCOUNTER — Encounter: Payer: Self-pay | Admitting: Surgical

## 2023-07-18 VITALS — BP 116/76

## 2023-07-18 DIAGNOSIS — M71122 Other infective bursitis, left elbow: Secondary | ICD-10-CM

## 2023-07-18 DIAGNOSIS — M00829 Arthritis due to other bacteria, unspecified elbow: Secondary | ICD-10-CM | POA: Diagnosis not present

## 2023-07-18 MED ORDER — METHOCARBAMOL 500 MG PO TABS: 500.0000 mg | ORAL_TABLET | Freq: Three times a day (TID) | ORAL | 0 refills | Status: DC | PRN

## 2023-07-18 MED ORDER — OXYCODONE HCL 5 MG PO TABS: 5.0000 mg | ORAL_TABLET | Freq: Every evening | ORAL | 0 refills | Status: DC | PRN

## 2023-07-18 NOTE — Progress Notes (Signed)
Post-Op Visit Note   Patient: Keith Swanson           Date of Birth: November 11, 1975           MRN: [id number] Visit Date: 07/18/2023 PCP: Meryl Crutch, MD   Assessment & Plan:  Chief Complaint:  Chief Complaint  Patient presents with   Left Elbow - Follow-up    I&D 06/13/2023   Visit Diagnoses:  1. Septic olecranon bursitis of left elbow     Plan: Patient is a 47 year old male who presents s/p left elbow irrigation and debridement on 06/13/2023 with excisional bursectomy for septic bursitis and septic arthritis.  Doing well overall.  Had PICC line removed today.  Scheduled to see infectious disease tomorrow.  Pain is well-controlled except at night when he has to take the pain medication but aside from this, he really does not have to take any of the opioid pain medication and just takes ibuprofen.  Has occasional stiffness primarily in the morning.  No fevers or chills.  No drainage from his incision.  He is doing home exercise program focusing on elbow range of motion.  He is not currently working but is trying to look for a new job.  On exam, patient has 2+ radial pulse of the left upper extremity with about 2 degrees extension and greater than 120 degrees of elbow flexion.  Incision is well-healed with no evidence of infection or dehiscence.  There is no fluctuance noted.  No expressible drainage.  No warmth.  Patient has no pain with passive motion except at terminal flexion and extension.  No pain with passive pronation or supination.  Intact EPL, finger abduction, grip strength testing.  Plan is refill pain medication 1 more time.  He will just take this at night to help with sleeping.  He will continue trying to achieve full extension.  Follow-up with infectious disease but no concern from orthopedic standpoint.  Follow-up for likely final check in 4 weeks with Dr. August Saucer.  Follow-Up Instructions: Return in about 4 weeks (around 08/15/2023), or with dr dean.   Orders:  No orders of the  defined types were placed in this encounter.  Meds ordered this encounter  Medications   oxyCODONE (ROXICODONE) 5 MG immediate release tablet    Sig: Take 1 tablet (5 mg total) by mouth at bedtime as needed for severe pain (pain score 7-10).    Dispense:  14 tablet    Refill:  0   methocarbamol (ROBAXIN) 500 MG tablet    Sig: Take 1 tablet (500 mg total) by mouth every 8 (eight) hours as needed for muscle spasms.    Dispense:  30 tablet    Refill:  0    Imaging: No results found.  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    Past Surgical History:  Procedure Laterality Date   FACIAL FRACTURE SURGERY     I & D EXTREMITY Left 06/13/2023   Procedure: IRRIGATION AND DEBRIDEMENT ELBOW, Bursa and Joint;  Surgeon: Cammy Copa, MD;  Location: [facility];  Service: Orthopedics;  Laterality: Left;   WISDOM TOOTH EXTRACTION     Social History   Occupational History   Not on file  Tobacco Use   Smoking status: Former    Current packs/day: 0.50    Types: Cigarettes   Smokeless tobacco: Never  Vaping Use   Vaping status: Never Used  Substance and Sexual Activity   Alcohol use: Not Currently   Drug use: No   Sexual activity: Not on file

## 2023-07-19 ENCOUNTER — Other Ambulatory Visit: Payer: Self-pay

## 2023-07-19 ENCOUNTER — Telehealth: Payer: Self-pay

## 2023-07-19 ENCOUNTER — Encounter: Payer: Self-pay | Admitting: Infectious Diseases

## 2023-07-19 ENCOUNTER — Ambulatory Visit (INDEPENDENT_AMBULATORY_CARE_PROVIDER_SITE_OTHER): Payer: 59 | Admitting: Infectious Diseases

## 2023-07-19 ENCOUNTER — Other Ambulatory Visit (HOSPITAL_COMMUNITY): Payer: Self-pay

## 2023-07-19 ENCOUNTER — Telehealth: Payer: Self-pay | Admitting: Surgical

## 2023-07-19 VITALS — BP 128/89 | HR 71 | Temp 97.5°F | Wt 220.0 lb

## 2023-07-19 DIAGNOSIS — Z452 Encounter for adjustment and management of vascular access device: Secondary | ICD-10-CM | POA: Insufficient documentation

## 2023-07-19 DIAGNOSIS — M00022 Staphylococcal arthritis, left elbow: Secondary | ICD-10-CM | POA: Diagnosis not present

## 2023-07-19 DIAGNOSIS — Z79899 Other long term (current) drug therapy: Secondary | ICD-10-CM

## 2023-07-19 DIAGNOSIS — R079 Chest pain, unspecified: Secondary | ICD-10-CM | POA: Diagnosis not present

## 2023-07-19 NOTE — Progress Notes (Signed)
Patient Active Problem List   Diagnosis Date Noted   Cellulitis of left elbow 06/18/2023   Septic arthritis of elbow, left (HCC) 06/15/2023   Cellulitis of left arm 06/11/2023   Transaminitis 06/11/2023   Hypoalbuminemia 06/11/2023   Tobacco use 06/11/2023   History of alcohol abuse 02/14/2023   Anxiety 02/14/2023   Mild intermittent asthma with (acute) exacerbation 04/22/2021   Obesity (BMI 30-39.9) 04/22/2021    Patient's Medications  New Prescriptions   No medications on file  Previous Medications   CEFTRIAXONE (ROCEPHIN) IVPB    Inject 2 g into the vein daily. Indication:  L-elbow septic arthritis First Dose: Yes Last Day of Therapy:  07/25/23 Labs - Once weekly:  CBC/D and BMP, Labs - Once weekly: ESR and CRP Method of administration: IV Push Method of administration may be changed at the discretion of home infusion pharmacist based upon assessment of the patient and/or caregiver's ability to self-administer the medication ordered.   CELECOXIB (CELEBREX) 100 MG CAPSULE    Take 1 capsule (100 mg total) by mouth 2 (two) times daily.   DAPTOMYCIN (CUBICIN) IVPB    Inject 750 mg into the vein daily. Indication:  L-elbow septic arthritis First Dose: Yes Last Day of Therapy:  07/25/23 Labs - Once weekly:  CBC/D, BMP, and CPK Labs - Once weekly: ESR and CRP Method of administration: IV Push Method of administration may be changed at the discretion of home infusion pharmacist based upon assessment of the patient and/or caregiver's ability to self-administer the medication ordered.   LINEZOLID (ZYVOX) 600 MG TABLET    Take 1 tablet (600 mg total) by mouth 2 (two) times daily. Start AFTER PICC line has been removed   METHOCARBAMOL (ROBAXIN) 500 MG TABLET    Take 1 tablet (500 mg total) by mouth every 8 (eight) hours as needed for muscle spasms.   OXYCODONE (ROXICODONE) 5 MG IMMEDIATE RELEASE TABLET    Take 1 tablet (5 mg total) by mouth at bedtime as needed for severe  pain (pain score 7-10).   PANTOPRAZOLE (PROTONIX) 40 MG TABLET    Take 1 tablet (40 mg total) by mouth daily.  Modified Medications   No medications on file  Discontinued Medications   No medications on file    Subjective: 47 year old male with prior history of asthma, low back pain poststreptococcal reactive arthritis, tobacco and alcohol abuse in remission who is here for hospital follow-up for left elbow septic arthritis/bursitis.  Patient underwent left elbow olecranon bursa excision with excisional debridement as well as arthrotomy of the elbow with I&D.  Or cultures methicillin-sensitive staph epidermidis.  Patient was seen by ID and discharged on 6 weeks of IV daptomycin and ceftriaxone via PICC line, EOT 07/25/2023.   07/19/23 Accomapanied by family member. PICC line removed yesterday due to concerns for s/e from abtx. He has been prescribed PO linezolid which he was prescribed for PO transition but waiting for pre-auth. Reports left elbow feels a lot better.  He experiences pain at night due to involuntary movements and incorrect positioning during sleep and takes analgesics as needed. The patient has been doing home physical therapy, as the healing rate has been satisfactory. The elbow's mobility is about 90% restored, with extension being the main concern. Closely followed by ortho. Sutures have been removed  Seen by Ortho yesterday and healing as expected post op with no concerns. They plan to fu with him in 4 weeks.   Complains  of chest pain which started upon waking, is continuous and intensifies at the peak of inhalation. The discomfort is located centrally in the chest, not predominantly on either side. The patient also reports experiencing night sweats a couple of nights a week, which he attributes to his antibiotic medication. Reports prior h/o pericarditis, discussed to go to ED multiple times for chest pain to which he refused and wanted to wait till noon.   Review of Systems:  Denies fevers, chills. 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   Past Surgical History:  Procedure Laterality Date   FACIAL FRACTURE SURGERY     I & D EXTREMITY Left 06/13/2023   Procedure: IRRIGATION AND DEBRIDEMENT ELBOW, Bursa and Joint;  Surgeon: Cammy Copa, MD;  Location: [facility];  Service: Orthopedics;  Laterality: Left;   WISDOM TOOTH EXTRACTION       Social History   Tobacco Use   Smoking status: Former    Current packs/day: 0.50    Types: Cigarettes   Smokeless tobacco: Never  Vaping Use   Vaping status: Never Used  Substance Use Topics   Alcohol use: Not Currently   Drug use: No    Family History  Problem Relation Age of Onset   Hypertension Mother    Heart attack Father    Prostate cancer Father    Cancer Other    Cancer Maternal Grandmother        Soft tissue sarcoma.    Allergies  Allergen Reactions   Penicillins Swelling    Did it involve swelling of the face/tongue/throat, SOB, or low BP? Yes Did it involve sudden or severe rash/hives, skin peeling, or any reaction on the inside of your mouth or nose? No Did you need to seek medical attention at a hospital or doctor's office? No When did it last happen?     childhood  If all above answers are "NO", may proceed with cephalosporin use.    Doxycycline Hives    Health Maintenance  Topic Date Due   Hepatitis C Screening  Never done   DTaP/Tdap/Td (1 - Tdap) Never done   Colonoscopy  Never done   INFLUENZA VACCINE  Never done   COVID-19 Vaccine (2 - 2023-24 season) 06/05/2023   HIV Screening  Completed   HPV VACCINES  Aged Out    Objective: BP 128/89   Pulse 71   Temp (!) 97.5 F (36.4 C) (Oral)   Wt 220 lb (99.8 kg)   SpO2 100%   BMI 29.84 kg/m    Physical Exam Constitutional:      Appearance: Normal appearance.  HENT:      Head: Normocephalic and atraumatic.      Mouth: Mucous membranes are moist.  Eyes:    Conjunctiva/sclera: Conjunctivae normal.     Pupils: Pupils are equal, round, and bilaterally symmetrical   Cardiovascular:     Rate and Rhythm: Normal rate and regular rhythm.     Heart sounds: s1s2  Pulmonary:     Effort: Pulmonary effort is normal.     Breath sounds: Normal breath sounds.   Abdominal:     General: Non distended     Palpations: soft.   Musculoskeletal:        General: Normal range of motion.   Left elbow with well healed incision, good  ROM although not 100%. No cellulitis, drainage and fluctuance   Skin:    General: Skin is warm and dry.     Comments:  Neurological:     General: grossly non focal     Mental Status: awake, alert and oriented to person, place, and time.   Psychiatric:        Mood and Affect: Mood normal.   Lab Results Lab Results  Component Value Date   WBC 11.0 (H) 06/18/2023   HGB 12.3 (L) 06/18/2023   HCT 37.3 (L) 06/18/2023   MCV 89.2 06/18/2023   PLT 244 06/18/2023    Lab Results  Component Value Date   CREATININE 0.79 06/16/2023   BUN 13 06/16/2023   NA 137 06/16/2023   K 3.9 06/16/2023   CL 103 06/16/2023   CO2 24 06/16/2023    Lab Results  Component Value Date   ALT 38 06/16/2023   AST 27 06/16/2023   ALKPHOS 56 06/16/2023   BILITOT 0.5 06/16/2023    No results found for: "CHOL", "HDL", "LDLCALC", "LDLDIRECT", "TRIG", "CHOLHDL" Lab Results  Component Value Date   LABRPR Non Reactive 12/19/2018   No results found for: "HIV1RNAQUANT", "HIV1RNAVL", "CD4TABS"   Microbiology Results for orders placed or performed during the hospital encounter of 06/11/23  Blood culture (routine x 2)     Status: None   Collection Time: 06/11/23  2:42 PM   Specimen: BLOOD RIGHT FOREARM  Result Value Ref Range Status   Specimen Description   Final    BLOOD RIGHT FOREARM Performed at [facility], [address], [place], [place]  [address]    Special Requests   Final    BOTTLES DRAWN AEROBIC AND ANAEROBIC Blood Culture adequate volume Performed at [facility], [address], [place], [place] [address]    Culture   Final    NO GROWTH 5 DAYS Performed at [facility], [address] [place], [place] [address]    Report Status 06/16/2023 FINAL  Final  Blood culture (routine x 2)     Status: None   Collection Time: 06/11/23  6:36 PM   Specimen: BLOOD RIGHT ARM  Result Value Ref Range Status   Specimen Description BLOOD RIGHT ARM  Final   Special Requests   Final    BOTTLES DRAWN AEROBIC ONLY Blood Culture adequate volume   Culture   Final    NO GROWTH 5 DAYS Performed at [facility], [address], [place], [place] [address]    Report Status 06/16/2023 FINAL  Final  Aerobic/Anaerobic Culture w Gram Stain (surgical/deep wound)     Status: None   Collection Time: 06/13/23  6:13 PM   Specimen: Synovial, Left Elbow; Body Fluid  Result Value Ref Range Status   Specimen Description   Final    SYNOVIAL  LEFT ELBOW Performed at [facility], [address] [place], [place] [address]    Special Requests   Final    NONE Performed at [facility], [address] [place], [place] [address]    Gram Stain NO WBC SEEN NO ORGANISMS SEEN   Final   Culture   Final    No growth aerobically or anaerobically. Performed at [facility], [address] [place], [place] [address]    Report Status 06/18/2023 FINAL  Final  Aerobic/Anaerobic Culture w Gram Stain (surgical/deep wound)     Status: None   Collection Time: 06/13/23  8:22 PM   Specimen: Synovial, Left Elbow; Body Fluid  Result Value Ref Range Status   Specimen Description   Final    SYNOVIAL LEFT ELBOW Performed at [facility], [address], [place], [place] [address]    Special Requests   Final    NONE Performed at [facility], [address] [place], [place] [address]    Gram Stain NO WBC SEEN NO ORGANISMS SEEN   Final   Culture   Final    No growth aerobically or anaerobically. Performed at [facility], [address] [place], [place] [address]    Report Status 06/18/2023 FINAL  Final  Aerobic/Anaerobic Culture w Gram Stain (surgical/deep wound)     Status: None   Collection Time: 06/13/23  8:22 PM   Specimen: Synovial, Left Elbow; Body Fluid  Result Value Ref Range Status   Specimen Description WOUND  Final   Special Requests LEFT ELBOW BURSA  Final   Gram Stain NO WBC SEEN NO ORGANISMS SEEN   Final   Culture   Final    RARE STAPHYLOCOCCUS EPIDERMIDIS NO ANAEROBES ISOLATED Performed at [facility], [address] [place], [place] [address]    Report Status 06/19/2023 FINAL  Final   Organism ID, Bacteria STAPHYLOCOCCUS EPIDERMIDIS  Final      Susceptibility   Staphylococcus epidermidis - MIC*    CIPROFLOXACIN <=0.5 SENSITIVE Sensitive     ERYTHROMYCIN <=0.25 SENSITIVE Sensitive     GENTAMICIN <=0.5 SENSITIVE Sensitive     OXACILLIN <=0.25 SENSITIVE Sensitive     TETRACYCLINE 2 SENSITIVE Sensitive     VANCOMYCIN 1 SENSITIVE Sensitive     TRIMETH/SULFA <=10 SENSITIVE Sensitive     CLINDAMYCIN <=0.25 SENSITIVE Sensitive     RIFAMPIN <=0.5 SENSITIVE Sensitive     Inducible Clindamycin NEGATIVE Sensitive     * RARE STAPHYLOCOCCUS EPIDERMIDIS   Imaging  MRI left elbow 9/8 IMPRESSION: 1. Prominent soft tissue swelling and fluid at the posterior aspect of the elbow and visualized left arm. There is a more well-defined fluid collection posterior to the olecranon process measuring approximately 4.2 x 1.2 x 3.4 cm. Appearance favors cellulitis with olecranon bursitis, which may be septic. 2. Small elbow joint effusion. This is nonspecific and could be reactive. Septic arthritis is not entirely excluded. 3. No evidence of osteomyelitis.   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 Prior -EOT was 10/21. Transitioned to PO linezolid   Plan -Continue PO linezolid for 7-10 more days and then stop -Fu in 10 days  -Fu with orthopedics as instructed   # Chest pain - r/o cardiac cause - discussed to go to ED multiple times but he want to wait till noon.   # PICC - Removed   # Medication management  -10/14 CBC, CMP unremarkable, ESR 6, CRP 4, CPK 73   I have personally spent 44  minutes involved in face-to-face and non-face-to-face activities for this patient on the day of the visit. Professional time spent includes the following activities: Preparing to see the patient (review of tests), Obtaining and/or reviewing separately obtained history (admission/discharge record), Performing a medically appropriate examination and/or evaluation , Ordering medications/tests/procedures, referring and communicating with other health care professionals, Documenting clinical information in the EMR, Independently interpreting results (not separately reported), Communicating results to the  patient/family/caregiver, Counseling and educating the patient/family/caregiver and Care coordination (not separately reported).   Victoriano Lain, MD [facility] 07/19/2023, 8:50 AM

## 2023-07-19 NOTE — Telephone Encounter (Signed)
Left voicemail with patient informing him PA has been approved. Juanita Laster, RMA

## 2023-07-19 NOTE — Telephone Encounter (Signed)
Patient called stating he needs a refill on linezolid please

## 2023-07-19 NOTE — Telephone Encounter (Signed)
Needs to call infectious disease

## 2023-07-19 NOTE — Telephone Encounter (Signed)
Pharmacy Patient Advocate Encounter  Received notification from [facility] that Prior Authorization for LINEZOLID 600 MG has been APPROVED from 07/19/23 to 08/16/23 with QUANTITY LIMIT of 28 Tablets  for a 14 day supply.  Co-pay of $0   PA #/Case ID/Reference #:  [id number]

## 2023-07-19 NOTE — Telephone Encounter (Signed)
Patient aware Also aware his oxycodone needed PA and that has been submitted

## 2023-07-28 ENCOUNTER — Telehealth: Payer: Self-pay

## 2023-07-28 NOTE — Telephone Encounter (Signed)
Patient called and left VM this morning in regards to wanting to reschedule his upcoming appt on 10/25 due [place]. Called patient back to assist with rescheduling, but no answer and left VM.

## 2023-07-29 ENCOUNTER — Ambulatory Visit: Payer: Medicaid Other | Admitting: Infectious Diseases

## 2023-08-10 ENCOUNTER — Encounter: Payer: Self-pay | Admitting: Family Medicine

## 2023-08-10 ENCOUNTER — Ambulatory Visit (INDEPENDENT_AMBULATORY_CARE_PROVIDER_SITE_OTHER): Payer: Medicaid Other | Admitting: Family Medicine

## 2023-08-10 VITALS — BP 128/76 | HR 84 | Temp 97.8°F | Ht 72.64 in | Wt 227.4 lb

## 2023-08-10 DIAGNOSIS — E6609 Other obesity due to excess calories: Secondary | ICD-10-CM | POA: Diagnosis not present

## 2023-08-10 DIAGNOSIS — Z1211 Encounter for screening for malignant neoplasm of colon: Secondary | ICD-10-CM

## 2023-08-10 DIAGNOSIS — Z6830 Body mass index (BMI) 30.0-30.9, adult: Secondary | ICD-10-CM | POA: Diagnosis not present

## 2023-08-10 DIAGNOSIS — K219 Gastro-esophageal reflux disease without esophagitis: Secondary | ICD-10-CM | POA: Diagnosis not present

## 2023-08-10 DIAGNOSIS — Z1159 Encounter for screening for other viral diseases: Secondary | ICD-10-CM

## 2023-08-10 DIAGNOSIS — G4700 Insomnia, unspecified: Secondary | ICD-10-CM

## 2023-08-10 DIAGNOSIS — Z7689 Persons encountering health services in other specified circumstances: Secondary | ICD-10-CM

## 2023-08-10 DIAGNOSIS — E66811 Obesity, class 1: Secondary | ICD-10-CM

## 2023-08-10 LAB — LIPID PANEL
Cholesterol: 158 mg/dL (ref 0–200)
HDL: 42.3 mg/dL (ref >39.00)
LDL Cholesterol: 99 mg/dL (ref 0–99)
NonHDL: 115.24
Total CHOL/HDL Ratio: 4
Triglycerides: 81 mg/dL (ref 0.0–149.0)
VLDL: 16.2 mg/dL (ref 0.0–40.0)

## 2023-08-10 LAB — CBC WITH DIFFERENTIAL/PLATELET
Basophils Absolute: 0 10*3/uL (ref 0.0–0.1)
Basophils Relative: 0.5 % (ref 0.0–3.0)
Eosinophils Absolute: 0.2 10*3/uL (ref 0.0–0.7)
Eosinophils Relative: 2.3 % (ref 0.0–5.0)
HCT: 44.9 % (ref 39.0–52.0)
Hemoglobin: 14.4 g/dL (ref 13.0–17.0)
Lymphocytes Relative: 33.1 % (ref 12.0–46.0)
Lymphs Abs: 2.8 10*3/uL (ref 0.7–4.0)
MCHC: 32.1 g/dL (ref 30.0–36.0)
MCV: 88.3 fL (ref 78.0–100.0)
Monocytes Absolute: 1 10*3/uL (ref 0.1–1.0)
Monocytes Relative: 11.9 % (ref 3.0–12.0)
Neutro Abs: 4.5 10*3/uL (ref 1.4–7.7)
Neutrophils Relative %: 52.2 % (ref 43.0–77.0)
Platelets: 222 10*3/uL (ref 150.0–400.0)
RBC: 5.08 Mil/uL (ref 4.22–5.81)
RDW: 13 % (ref 11.5–15.5)
WBC: 8.5 10*3/uL (ref 4.0–10.5)

## 2023-08-10 LAB — COMPREHENSIVE METABOLIC PANEL
ALT: 23 U/L (ref 0–53)
AST: 26 U/L (ref 0–37)
Albumin: 4.5 g/dL (ref 3.5–5.2)
Alkaline Phosphatase: 98 U/L (ref 39–117)
BUN: 9 mg/dL (ref 6–23)
CO2: 27 meq/L (ref 19–32)
Calcium: 9.5 mg/dL (ref 8.4–10.5)
Chloride: 102 meq/L (ref 96–112)
Creatinine, Ser: 0.93 mg/dL (ref 0.40–1.50)
GFR: 97.07 mL/min (ref >60.00)
Glucose, Bld: 93 mg/dL (ref 70–99)
Potassium: 3.9 meq/L (ref 3.5–5.1)
Sodium: 137 meq/L (ref 135–145)
Total Bilirubin: 0.9 mg/dL (ref 0.2–1.2)
Total Protein: 8 g/dL (ref 6.0–8.3)

## 2023-08-10 LAB — HEMOGLOBIN A1C: Hgb A1c MFr Bld: 4.8 % (ref 4.6–6.5)

## 2023-08-10 LAB — TSH: TSH: 1.26 u[IU]/mL (ref 0.35–5.50)

## 2023-08-10 MED ORDER — PANTOPRAZOLE SODIUM 40 MG PO TBEC: 40.0000 mg | DELAYED_RELEASE_TABLET | Freq: Every day | ORAL | 2 refills | Status: AC

## 2023-08-10 MED ORDER — HYDROXYZINE PAMOATE 100 MG PO CAPS: 100.0000 mg | ORAL_CAPSULE | Freq: Every evening | ORAL | 0 refills | Status: AC

## 2023-08-10 NOTE — Progress Notes (Unsigned)
New Patient Office Visit  Subjective    Patient ID: Keith Swanson, male    DOB: 11-24-1975  Age: 47 y.o. MRN: [id number]  CC:  Chief Complaint  Patient presents with   Establish Care    Recent hospital admission     HPI Keith Swanson presents to establish care with new provider.  Patients previous primary care provider: [facility] with Dr. Cinda Quest. Last seen 02/14/2023.   Specialist: [facility] with Harriette Bouillon, [place].  [facility]-Dr. Victoriano Lain   Insomnia: Chronic. Patient is taking Hydroxyzine 100mg  at night. He reports medication is effective.   GERD: Chronic. Patient is taking Omeprazole 20mg  daily, but reports Pantoprazole 40mg  is more effective. He would prefer to take Pantoprazole.  Outpatient Encounter Medications as of 08/10/2023  Medication Sig   celecoxib (CELEBREX) 100 MG capsule Take 100 mg by mouth 2 (two) times daily.   [DISCONTINUED] hydrOXYzine (VISTARIL) 100 MG capsule Take 100 mg by mouth at bedtime.   [DISCONTINUED] omeprazole (PRILOSEC) 20 MG capsule Take 20 mg by mouth daily.   [DISCONTINUED] pantoprazole (PROTONIX) 40 MG tablet Take 1 tablet (40 mg total) by mouth daily.   hydrOXYzine (VISTARIL) 100 MG capsule Take 1 capsule (100 mg total) by mouth at bedtime for 90 doses.   pantoprazole (PROTONIX) 40 MG tablet Take 1 tablet (40 mg total) by mouth daily.   [DISCONTINUED] linezolid (ZYVOX) 600 MG tablet Take 1 tablet (600 mg total) by mouth 2 (two) times daily. Start AFTER PICC line has been removed (Patient not taking: Reported on 07/19/2023)   [DISCONTINUED] methocarbamol (ROBAXIN) 500 MG tablet Take 1 tablet (500 mg total) by mouth every 8 (eight) hours as needed for muscle spasms. (Patient not taking: Reported on 08/10/2023)   [DISCONTINUED] oxyCODONE (ROXICODONE) 5 MG immediate release tablet Take 1 tablet (5 mg total) by mouth at bedtime as  needed for severe pain (pain score 7-10). (Patient not taking: Reported on 08/10/2023)   No facility-administered encounter medications on file as of 08/10/2023.   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   Septic olecranon bursitis of left elbow     Past Surgical History:  Procedure Laterality Date   ELBOW SURGERY Left    FACIAL FRACTURE SURGERY     HERNIA REPAIR     I & D EXTREMITY Left 06/13/2023   Procedure: IRRIGATION AND DEBRIDEMENT ELBOW, Bursa and Joint;  Surgeon: Cammy Copa, MD;  Location: [facility];  Service: Orthopedics;  Laterality: Left;   WISDOM TOOTH EXTRACTION      Family History  Problem Relation Age of Onset   Anxiety disorder Mother    Depression Mother    Alcohol abuse Mother    Hyperlipidemia Mother    Hypertension Mother    Mental illness Mother    Cancer Father    Heart attack Father    Prostate cancer Father    Asthma Brother    Other Brother        Liver Transplant   Hypertension Maternal Grandmother    Drug abuse Maternal Grandmother    COPD Maternal Grandmother    Alcohol abuse Maternal Grandmother    Cancer Maternal Grandmother        Soft tissue sarcoma.  Heart disease Maternal Grandfather    Dementia Paternal Grandfather    Cancer Other     Social History   Socioeconomic History   Marital status: Single    Spouse name: Not on file   Number of children: 5   Years of education: Not on file   Highest education level: Some college, no degree  Occupational History   Not on file  Tobacco Use   Smoking status: Every Day    Current packs/day: 0.50    Types: Cigarettes   Smokeless tobacco: Never  Vaping Use   Vaping status: Never Used  Substance and Sexual Activity   Alcohol use: Yes    Comment: 5-6 times a week, Beer   Drug use: Not Currently   Sexual activity: Not on file  Other  Topics Concern   Not on file  Social History Narrative   Not on file   Social Determinants of Health   Financial Resource Strain: Medium Risk (08/09/2023)   Overall Financial Resource Strain (CARDIA)    Difficulty of Paying Living Expenses: Somewhat hard  Food Insecurity: No Food Insecurity (08/09/2023)   Hunger Vital Sign    Worried About Running Out of Food in the Last Year: Never true    Ran Out of Food in the Last Year: Never true  Transportation Needs: No Transportation Needs (08/09/2023)   PRAPARE - Administrator, Civil Service (Medical): No    Lack of Transportation (Non-Medical): No  Physical Activity: Insufficiently Active (08/09/2023)   Exercise Vital Sign    Days of Exercise per Week: 4 days    Minutes of Exercise per Session: 10 min  Stress: Stress Concern Present (08/09/2023)   [employer] of Occupational Health - Occupational Stress Questionnaire    Feeling of Stress : To some extent  Social Connections: Moderately Isolated (08/09/2023)   Social Connection and Isolation Panel [NHANES]    Frequency of Communication with Friends and Family: More than three times a week    Frequency of Social Gatherings with Friends and Family: Three times a week    Attends Religious Services: 1 to 4 times per year    Active Member of Clubs or Organizations: No    Attends Banker Meetings: Not on file    Marital Status: Divorced  Intimate Partner Violence: Not At Risk (06/11/2023)   Humiliation, Afraid, Rape, and Kick questionnaire    Fear of Current or Ex-Partner: No    Emotionally Abused: No    Physically Abused: No    Sexually Abused: No    ROS See HPI above    Objective   BP 128/76   Pulse 84   Temp 97.8 F (36.6 C) (Temporal)   Ht 6' 0.64" (1.845 m)   Wt 227 lb 6.4 oz (103.1 kg)   SpO2 98%   BMI 30.30 kg/m   Physical Exam Vitals reviewed.  Constitutional:      General: He is not in acute distress.    Appearance: Normal appearance. He is  obese. He is not ill-appearing, toxic-appearing or diaphoretic.  HENT:     Head: Normocephalic and atraumatic.  Eyes:     General:        Right eye: No discharge.        Left eye: No discharge.     Conjunctiva/sclera: Conjunctivae normal.  Cardiovascular:     Rate and Rhythm: Normal rate and regular rhythm.     Heart sounds: Normal heart sounds. No murmur heard.  No friction rub. No gallop.  Pulmonary:     Effort: Pulmonary effort is normal. No respiratory distress.     Breath sounds: Normal breath sounds.  Musculoskeletal:        General: Normal range of motion.  Skin:    General: Skin is warm and dry.  Neurological:     General: No focal deficit present.     Mental Status: He is alert and oriented to person, place, and time. Mental status is at baseline.  Psychiatric:        Mood and Affect: Mood normal.        Behavior: Behavior normal.        Thought Content: Thought content normal.        Judgment: Judgment normal.      Assessment & Plan:  Insomnia, unspecified type Assessment & Plan: Stable. Continue taking Hydroxyzine 100mg  at night. Refilled medication.   Orders: -     hydrOXYzine Pamoate; Take 1 capsule (100 mg total) by mouth at bedtime for 90 doses.  Dispense: 90 capsule; Refill: 0  Gastroesophageal reflux disease without esophagitis Assessment & Plan: Not as controlled. Discontinued Omeprazole 20mg  daily and refilled Pantoprazole 40mg  daily that was also on his medication list. Patient is being referred to GI for a colonoscopy.   Orders: -     Pantoprazole Sodium; Take 1 tablet (40 mg total) by mouth daily.  Dispense: 30 tablet; Refill: 2  Colon cancer screening -     Ambulatory referral to Gastroenterology  Class 1 obesity due to excess calories without serious comorbidity with body mass index (BMI) of 30.0 to 30.9 in adult -     Lipid panel -     CBC with Differential/Platelet -     Comprehensive metabolic panel -     TSH -     Hemoglobin  A1c  Need for hepatitis C screening test -     Hepatitis C antibody  Encounter to establish care   1.Review health maintenance:  -Colonoscopy/Cologuard: Referral to GI -Covid boosters: Declines  -Influenza vaccine: Declines -Hep C screening: Ordered lab  2.Encouraged patient to make a follow up appointment with infectious disease. He had to cancel his 10/25 appointment. Based on note, the office has tried to reach out to patient to reschedule.  3. Ordered labs based on BMI. May need to follow up sooner than 6 months if labs are concerning.  Return in about 6 months (around 02/07/2024) for chronic management.   Zandra Abts, NP

## 2023-08-11 ENCOUNTER — Encounter: Payer: Self-pay | Admitting: Family Medicine

## 2023-08-11 DIAGNOSIS — K219 Gastro-esophageal reflux disease without esophagitis: Secondary | ICD-10-CM | POA: Insufficient documentation

## 2023-08-11 DIAGNOSIS — G4700 Insomnia, unspecified: Secondary | ICD-10-CM | POA: Insufficient documentation

## 2023-08-11 LAB — HEPATITIS C ANTIBODY: Hepatitis C Ab: NONREACTIVE

## 2023-08-11 NOTE — Patient Instructions (Addendum)
-  It was a pleasure to meet you and look forward to taking care of you. -Continue Hydroxyzine for insomnia. Refilled medication.  -Stop taking Omeprazole and restart Pantoprazole 40mg  daily since it is more effective. Refilled medication.  -Placed a referral to GI for colonoscopy to screen for colorectal cancer.  -Please make a follow up appointment with infectious disease with having to cancel 10/25 appointment. Based on note, the office has tried to reach you.  -Ordered labs. Office will call with lab results and you will see them on MyChart. May need to follow up if labs are concerning before your 6 month chronic management appointment. -Follow up in 6 months.

## 2023-08-11 NOTE — Assessment & Plan Note (Signed)
Not as controlled. Discontinued Omeprazole 20mg  daily and refilled Pantoprazole 40mg  daily that was also on his medication list. Patient is being referred to GI for a colonoscopy.

## 2023-08-11 NOTE — Assessment & Plan Note (Signed)
Stable. Continue taking Hydroxyzine 100mg  at night. Refilled medication.

## 2023-08-15 ENCOUNTER — Encounter: Payer: Medicaid Other | Admitting: Orthopedic Surgery

## 2023-08-25 ENCOUNTER — Ambulatory Visit: Payer: Medicaid Other | Admitting: Infectious Diseases

## 2023-09-14 ENCOUNTER — Telehealth: Payer: Self-pay | Admitting: Orthopedic Surgery

## 2023-09-14 ENCOUNTER — Telehealth: Payer: Self-pay

## 2023-09-14 NOTE — Telephone Encounter (Signed)
Patient called office to schedule follow up appointment. Rescheduled for tomorrow at 845.  Patient is also asking for note to return to work. States that his employer will need clearance note from ID stating he is okay to come back to work without restrictions.  When asked who wrote patient out he states no one did. States that with everything going on infection he called out.  Advised patient to also contact orthopedic provider regarding note.  Juanita Laster, RMA

## 2023-09-14 NOTE — Telephone Encounter (Signed)
Patient called and wants a note that is stating what he had surgery on and need clearance to be able to continue to work. CB#[phone]

## 2023-09-14 NOTE — Telephone Encounter (Signed)
Okay for this, no restrictions

## 2023-09-15 ENCOUNTER — Other Ambulatory Visit: Payer: Self-pay | Admitting: Infectious Diseases

## 2023-09-15 ENCOUNTER — Encounter: Payer: Self-pay | Admitting: Infectious Diseases

## 2023-09-15 ENCOUNTER — Ambulatory Visit (INDEPENDENT_AMBULATORY_CARE_PROVIDER_SITE_OTHER): Payer: Medicaid Other | Admitting: Infectious Diseases

## 2023-09-15 ENCOUNTER — Encounter (HOSPITAL_BASED_OUTPATIENT_CLINIC_OR_DEPARTMENT_OTHER): Payer: Self-pay

## 2023-09-15 ENCOUNTER — Ambulatory Visit: Payer: Medicaid Other | Admitting: Internal Medicine

## 2023-09-15 ENCOUNTER — Other Ambulatory Visit: Payer: Self-pay

## 2023-09-15 VITALS — BP 118/80 | HR 61 | Temp 97.8°F | Ht 72.0 in | Wt 230.0 lb

## 2023-09-15 DIAGNOSIS — Z720 Tobacco use: Secondary | ICD-10-CM

## 2023-09-15 DIAGNOSIS — M00022 Staphylococcal arthritis, left elbow: Secondary | ICD-10-CM

## 2023-09-15 DIAGNOSIS — Z79899 Other long term (current) drug therapy: Secondary | ICD-10-CM | POA: Diagnosis not present

## 2023-09-15 NOTE — Telephone Encounter (Signed)
Called and advised pt.

## 2023-09-15 NOTE — Progress Notes (Signed)
Patient Active Problem List   Diagnosis Date Noted   Insomnia 08/11/2023   Gastroesophageal reflux disease without esophagitis 08/11/2023   PICC (peripherally inserted central catheter) removal 07/19/2023   Medication management 07/19/2023   Chest pain 07/19/2023   Cellulitis of left elbow 06/18/2023   Septic arthritis of elbow, left (HCC) 06/15/2023   Cellulitis of left arm 06/11/2023   Transaminitis 06/11/2023   Hypoalbuminemia 06/11/2023   Tobacco use 06/11/2023   History of alcohol abuse 02/14/2023   Anxiety 02/14/2023   Mild intermittent asthma with (acute) exacerbation 04/22/2021   Obesity (BMI 30-39.9) 04/22/2021    Patient's Medications  New Prescriptions   No medications on file  Previous Medications   CELECOXIB (CELEBREX) 100 MG CAPSULE    Take 100 mg by mouth 2 (two) times daily.   HYDROXYZINE (VISTARIL) 100 MG CAPSULE    Take 1 capsule (100 mg total) by mouth at bedtime for 90 doses.   PANTOPRAZOLE (PROTONIX) 40 MG TABLET    Take 1 tablet (40 mg total) by mouth daily.  Modified Medications   No medications on file  Discontinued Medications   No medications on file    Subjective: 47 year old male with prior history of asthma, low back pain poststreptococcal reactive arthritis, tobacco and alcohol abuse in remission who is here for hospital follow-up for left elbow septic arthritis/bursitis.  Patient underwent left elbow olecranon bursa excision with excisional debridement as well as arthrotomy of the elbow with I&D.  Or cultures methicillin-sensitive staph epidermidis.  Patient was seen by ID and discharged on 6 weeks of IV daptomycin and ceftriaxone via PICC line, EOT 07/25/2023.   07/19/23 Accomapanied by family member. PICC line removed yesterday due to concerns for s/e from abtx. He has been prescribed PO linezolid which he was prescribed for PO transition but waiting for pre-auth. Reports left elbow feels a lot better.  He experiences pain at night due to  involuntary movements and incorrect positioning during sleep and takes analgesics as needed. The patient has been doing home physical therapy, as the healing rate has been satisfactory. The elbow's mobility is about 90% restored, with extension being the main concern. Closely followed by ortho. Sutures have been removed  Seen by Ortho yesterday and healing as expected post op with no concerns. They plan to fu with him in 4 weeks.   Complains of chest pain which started upon waking, is continuous and intensifies at the peak of inhalation. The discomfort is located centrally in the chest, not predominantly on either side. The patient also reports experiencing night sweats a couple of nights a week, which he attributes to his antibiotic medication. Reports prior h/o pericarditis, discussed to go to ED multiple times for chest pain to which he refused and wanted to wait till noon.   12/12 He is here for getting letter regarding return to work. He has completed course of linezolid. Left elbow has healed and he has no concerns. Saw PCP 11/6. No restrictions for return to work from [employer].  He has no other complaints today.   Review of Systems: Denies fevers, chills. 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   Septic olecranon bursitis of left elbow    Past Surgical History:  Procedure Laterality Date  ELBOW SURGERY Left    FACIAL FRACTURE SURGERY     HERNIA REPAIR     I & D EXTREMITY Left 06/13/2023   Procedure: IRRIGATION AND DEBRIDEMENT ELBOW, Bursa and Joint;  Surgeon: Cammy Copa, MD;  Location: [facility];  Service: Orthopedics;  Laterality: Left;   WISDOM TOOTH EXTRACTION       Social History   Tobacco Use   Smoking status: Every Day    Current packs/day: 0.50    Types: Cigarettes   Smokeless  tobacco: Never  Vaping Use   Vaping status: Never Used  Substance Use Topics   Alcohol use: Yes    Comment: 5-6 times a week, Beer   Drug use: Not Currently    Family History  Problem Relation Age of Onset   Anxiety disorder Mother    Depression Mother    Alcohol abuse Mother    Hyperlipidemia Mother    Hypertension Mother    Mental illness Mother    Cancer Father    Heart attack Father    Prostate cancer Father    Asthma Brother    Other Brother        Liver Transplant   Hypertension Maternal Grandmother    Drug abuse Maternal Grandmother    COPD Maternal Grandmother    Alcohol abuse Maternal Grandmother    Cancer Maternal Grandmother        Soft tissue sarcoma.   Heart disease Maternal Grandfather    Dementia Paternal Grandfather    Cancer Other     Allergies  Allergen Reactions   Penicillins Swelling    Did it involve swelling of the face/tongue/throat, SOB, or low BP? Yes Did it involve sudden or severe rash/hives, skin peeling, or any reaction on the inside of your mouth or nose? No Did you need to seek medical attention at a hospital or doctor's office? No When did it last happen?     childhood  If all above answers are "NO", may proceed with cephalosporin use.    Doxycycline Hives    Health Maintenance  Topic Date Due   DTaP/Tdap/Td (1 - Tdap) Never done   Colonoscopy  Never done   COVID-19 Vaccine (2 - Pfizer risk series) 01/03/2021   INFLUENZA VACCINE  Never done   Hepatitis C Screening  Completed   HIV Screening  Completed   HPV VACCINES  Aged Out    Objective: BP (!) 134/94   Pulse 61   Temp 97.8 F (36.6 C) (Oral)   Ht 6' (1.829 m)   Wt 230 lb (104.3 kg)   SpO2 100%   BMI 31.19 kg/m   Physical Exam Constitutional:      Appearance: Normal appearance.  HENT:     Head: Normocephalic and atraumatic.      Mouth: Mucous membranes are moist.  Eyes:    Conjunctiva/sclera: Conjunctivae normal.     Pupils: Pupils are equal, round, and  bilaterally symmetrical   Cardiovascular:     Rate and Rhythm: Normal rate and regular rhythm.     Heart sounds:   Pulmonary:     Effort: Pulmonary effort is normal.     Breath sounds:  Abdominal:     General: Non distended     Palpations:   Musculoskeletal:        General: Normal range of motion.   Left elbow with well healed incision, good ROM. No concerns  Skin:    General: Skin is warm and dry.  Comments:  Neurological:     General: grossly non focal     Mental Status: awake, alert and oriented to person, place, and time.   Psychiatric:        Mood and Affect: Mood normal.   Lab Results Lab Results  Component Value Date   WBC 8.5 08/10/2023   HGB 14.4 08/10/2023   HCT 44.9 08/10/2023   MCV 88.3 08/10/2023   PLT 222.0 08/10/2023    Lab Results  Component Value Date   CREATININE 0.93 08/10/2023   BUN 9 08/10/2023   NA 137 08/10/2023   K 3.9 08/10/2023   CL 102 08/10/2023   CO2 27 08/10/2023    Lab Results  Component Value Date   ALT 23 08/10/2023   AST 26 08/10/2023   ALKPHOS 98 08/10/2023   BILITOT 0.9 08/10/2023    Lab Results  Component Value Date   CHOL 158 08/10/2023   HDL 42.30 08/10/2023   LDLCALC 99 08/10/2023   TRIG 81.0 08/10/2023   CHOLHDL 4 08/10/2023   Lab Results  Component Value Date   LABRPR Non Reactive 12/19/2018   No results found for: "HIV1RNAQUANT", "HIV1RNAVL", "CD4TABS"   Microbiology Results for orders placed or performed during the hospital encounter of 06/11/23  Blood culture (routine x 2)     Status: None   Collection Time: 06/11/23  2:42 PM   Specimen: BLOOD RIGHT FOREARM  Result Value Ref Range Status   Specimen Description   Final    BLOOD RIGHT FOREARM Performed at [facility], [address] [place], [place] [address]    Special Requests   Final    BOTTLES DRAWN AEROBIC AND ANAEROBIC Blood Culture adequate volume Performed at [facility], [address], [place],  [place] [address]    Culture   Final    NO GROWTH 5 DAYS Performed at [facility], [address] [place], [place] [address]    Report Status 06/16/2023 FINAL  Final  Blood culture (routine x 2)     Status: None   Collection Time: 06/11/23  6:36 PM   Specimen: BLOOD RIGHT ARM  Result Value Ref Range Status   Specimen Description BLOOD RIGHT ARM  Final   Special Requests   Final    BOTTLES DRAWN AEROBIC ONLY Blood Culture adequate volume   Culture   Final    NO GROWTH 5 DAYS Performed at [facility], [address] [place], [place] [address]    Report Status 06/16/2023 FINAL  Final  Aerobic/Anaerobic Culture w Gram Stain (surgical/deep wound)     Status: None   Collection Time: 06/13/23  6:13 PM   Specimen: Synovial, Left Elbow; Body Fluid  Result Value Ref Range Status   Specimen Description   Final    SYNOVIAL  LEFT ELBOW Performed at [facility], [address], [place], [place] [address]    Special Requests   Final    NONE Performed at [facility], [address] [place], [place] [address]    Gram Stain NO WBC SEEN NO ORGANISMS SEEN   Final   Culture   Final    No growth aerobically or anaerobically. Performed at [facility], [address], [place], [place] [address]    Report Status 06/18/2023 FINAL  Final  Aerobic/Anaerobic Culture w Gram Stain (surgical/deep wound)     Status: None   Collection Time: 06/13/23  8:22 PM  Specimen: Synovial, Left Elbow; Body Fluid  Result Value Ref Range Status   Specimen Description   Final    SYNOVIAL LEFT ELBOW Performed at [facility], [address], [place], [place] [address]    Special Requests   Final    NONE Performed at [facility], [address], [place], [place] [address]    Gram Stain NO WBC SEEN NO ORGANISMS SEEN   Final   Culture   Final    No growth aerobically or anaerobically. Performed at [facility], [address], [place], [place] [address]    Report Status 06/18/2023 FINAL  Final  Aerobic/Anaerobic Culture w Gram Stain (surgical/deep wound)     Status: None   Collection Time: 06/13/23  8:22 PM   Specimen: Synovial, Left Elbow; Body Fluid  Result Value Ref Range Status   Specimen Description WOUND  Final   Special Requests LEFT ELBOW BURSA  Final   Gram Stain NO WBC SEEN NO ORGANISMS SEEN   Final   Culture   Final    RARE STAPHYLOCOCCUS EPIDERMIDIS NO ANAEROBES ISOLATED Performed at [facility], [address], [place], [place] [address]    Report Status 06/19/2023 FINAL  Final   Organism ID, Bacteria STAPHYLOCOCCUS EPIDERMIDIS  Final      Susceptibility   Staphylococcus epidermidis - MIC*    CIPROFLOXACIN <=0.5 SENSITIVE Sensitive     ERYTHROMYCIN <=0.25 SENSITIVE Sensitive     GENTAMICIN <=0.5 SENSITIVE Sensitive     OXACILLIN <=0.25 SENSITIVE Sensitive     TETRACYCLINE 2 SENSITIVE Sensitive     VANCOMYCIN 1 SENSITIVE Sensitive     TRIMETH/SULFA <=10 SENSITIVE Sensitive     CLINDAMYCIN <=0.25 SENSITIVE Sensitive     RIFAMPIN <=0.5 SENSITIVE Sensitive     Inducible Clindamycin NEGATIVE Sensitive     * RARE STAPHYLOCOCCUS EPIDERMIDIS   Imaging  MRI left elbow 9/8 IMPRESSION: 1. Prominent soft tissue swelling and fluid at the posterior aspect of the elbow and visualized left arm. There is a more well-defined fluid collection posterior to the olecranon process measuring approximately 4.2 x 1.2 x 3.4 cm. Appearance favors cellulitis with olecranon bursitis, which may be septic. 2. Small elbow joint effusion. This is nonspecific and could be reactive. Septic arthritis is not entirely excluded. 3. No evidence of osteomyelitis.   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 Prior -EOT was 10/21. Transitioned  to PO linezolid and completed course  - 12/12 left elbow exam healed  - Fu with [place] as needed   # Letter - Return to work letter provided   # Smoking  - will benefit from cessation/cutting down  I have personally spent 30 minutes involved in face-to-face and non-face-to-face activities for this patient on the day of the visit. Professional time spent includes the following activities: Preparing to see the patient (review of tests), Obtaining and/or reviewing separately obtained history (admission/discharge record), Performing a medically appropriate examination and/or evaluation , Ordering medications/tests/procedures, referring and communicating with other health care professionals, Documenting clinical information in the EMR, Independently interpreting results (not separately reported), Communicating results to the patient/family/caregiver, Counseling and educating the patient/family/caregiver and Care coordination (not separately reported).   Victoriano Lain, MD [facility] 09/15/2023, 8:50 AM

## 2023-09-15 NOTE — Telephone Encounter (Signed)
Note placed in mychart.

## 2023-09-26 DIAGNOSIS — Z125 Encounter for screening for malignant neoplasm of prostate: Secondary | ICD-10-CM | POA: Diagnosis not present

## 2023-09-26 DIAGNOSIS — Z1159 Encounter for screening for other viral diseases: Secondary | ICD-10-CM | POA: Diagnosis not present

## 2023-09-26 DIAGNOSIS — Z131 Encounter for screening for diabetes mellitus: Secondary | ICD-10-CM | POA: Diagnosis not present

## 2023-09-26 DIAGNOSIS — Z0000 Encounter for general adult medical examination without abnormal findings: Secondary | ICD-10-CM | POA: Diagnosis not present

## 2023-09-26 DIAGNOSIS — E039 Hypothyroidism, unspecified: Secondary | ICD-10-CM | POA: Diagnosis not present

## 2023-09-26 DIAGNOSIS — Z13220 Encounter for screening for lipoid disorders: Secondary | ICD-10-CM | POA: Diagnosis not present

## 2023-09-26 DIAGNOSIS — E559 Vitamin D deficiency, unspecified: Secondary | ICD-10-CM | POA: Diagnosis not present

## 2024-01-26 DIAGNOSIS — I491 Atrial premature depolarization: Secondary | ICD-10-CM | POA: Diagnosis not present

## 2024-02-03 ENCOUNTER — Other Ambulatory Visit: Payer: Self-pay

## 2024-02-03 ENCOUNTER — Emergency Department (HOSPITAL_COMMUNITY)
Admission: EM | Admit: 2024-02-03 | Discharge: 2024-02-03 | Disposition: A | Discharge status: Home / Self Care | Attending: Emergency Medicine | Admitting: Emergency Medicine

## 2024-02-03 ENCOUNTER — Emergency Department (HOSPITAL_COMMUNITY)

## 2024-02-03 DIAGNOSIS — R062 Wheezing: Secondary | ICD-10-CM | POA: Diagnosis not present

## 2024-02-03 DIAGNOSIS — R079 Chest pain, unspecified: Secondary | ICD-10-CM | POA: Insufficient documentation

## 2024-02-03 DIAGNOSIS — M87051 Idiopathic aseptic necrosis of right femur: Secondary | ICD-10-CM | POA: Diagnosis not present

## 2024-02-03 DIAGNOSIS — R059 Cough, unspecified: Secondary | ICD-10-CM | POA: Diagnosis not present

## 2024-02-03 DIAGNOSIS — M25551 Pain in right hip: Secondary | ICD-10-CM | POA: Diagnosis not present

## 2024-02-03 DIAGNOSIS — R0981 Nasal congestion: Secondary | ICD-10-CM | POA: Insufficient documentation

## 2024-02-03 DIAGNOSIS — R0602 Shortness of breath: Secondary | ICD-10-CM | POA: Insufficient documentation

## 2024-02-03 DIAGNOSIS — M25451 Effusion, right hip: Secondary | ICD-10-CM | POA: Diagnosis not present

## 2024-02-03 DIAGNOSIS — D72829 Elevated white blood cell count, unspecified: Secondary | ICD-10-CM | POA: Insufficient documentation

## 2024-02-03 DIAGNOSIS — N50811 Right testicular pain: Secondary | ICD-10-CM | POA: Diagnosis not present

## 2024-02-03 LAB — RESP PANEL BY RT-PCR (RSV, FLU A&B, COVID)  RVPGX2
Influenza A by PCR: NEGATIVE
Influenza B by PCR: NEGATIVE
Resp Syncytial Virus by PCR: NEGATIVE
SARS Coronavirus 2 by RT PCR: NEGATIVE

## 2024-02-03 LAB — COMPREHENSIVE METABOLIC PANEL WITH GFR
ALT: 43 U/L (ref 0–44)
AST: 34 U/L (ref 15–41)
Albumin: 3.3 g/dL — ABNORMAL LOW (ref 3.5–5.0)
Alkaline Phosphatase: 55 U/L (ref 38–126)
Anion gap: 8 (ref 5–15)
BUN: 14 mg/dL (ref 6–20)
CO2: 26 mmol/L (ref 22–32)
Calcium: 8.4 mg/dL — ABNORMAL LOW (ref 8.9–10.3)
Chloride: 103 mmol/L (ref 98–111)
Creatinine, Ser: 0.91 mg/dL (ref 0.61–1.24)
GFR, Estimated: >60 mL/min (ref >60)
Glucose, Bld: 102 mg/dL — ABNORMAL HIGH (ref 70–99)
Potassium: 3.7 mmol/L (ref 3.5–5.1)
Sodium: 137 mmol/L (ref 135–145)
Total Bilirubin: 1 mg/dL (ref 0.0–1.2)
Total Protein: 5.7 g/dL — ABNORMAL LOW (ref 6.5–8.1)

## 2024-02-03 LAB — CBC WITH DIFFERENTIAL/PLATELET
Abs Immature Granulocytes: 0.52 10*3/uL — ABNORMAL HIGH (ref 0.00–0.07)
Basophils Absolute: 0.1 10*3/uL (ref 0.0–0.1)
Basophils Relative: 1 %
Eosinophils Absolute: 0.6 10*3/uL — ABNORMAL HIGH (ref 0.0–0.5)
Eosinophils Relative: 3 %
HCT: 41.3 % (ref 39.0–52.0)
Hemoglobin: 14.2 g/dL (ref 13.0–17.0)
Immature Granulocytes: 3 %
Lymphocytes Relative: 30 %
Lymphs Abs: 5.2 10*3/uL — ABNORMAL HIGH (ref 0.7–4.0)
MCH: 31 pg (ref 26.0–34.0)
MCHC: 34.4 g/dL (ref 30.0–36.0)
MCV: 90.2 fL (ref 80.0–100.0)
Monocytes Absolute: 1.6 10*3/uL — ABNORMAL HIGH (ref 0.1–1.0)
Monocytes Relative: 9 %
Neutro Abs: 9.2 10*3/uL — ABNORMAL HIGH (ref 1.7–7.7)
Neutrophils Relative %: 54 %
Platelets: 187 10*3/uL (ref 150–400)
RBC: 4.58 MIL/uL (ref 4.22–5.81)
RDW: 12.5 % (ref 11.5–15.5)
WBC: 17.2 10*3/uL — ABNORMAL HIGH (ref 4.0–10.5)
nRBC: 0 % (ref 0.0–0.2)

## 2024-02-03 LAB — TROPONIN I (HIGH SENSITIVITY)
Troponin I (High Sensitivity): 4 ng/L (ref <18)
Troponin I (High Sensitivity): 2 ng/L (ref <18)

## 2024-02-03 MED ORDER — ONDANSETRON 4 MG PO TBDP
4.0000 mg | ORAL_TABLET | Freq: Once | ORAL | Status: AC
  Administered 2024-02-03: 4 mg via ORAL

## 2024-02-03 MED ORDER — HYDROMORPHONE HCL 1 MG/ML IJ SOLN
1.0000 mg | Freq: Once | INTRAMUSCULAR | Status: AC
  Administered 2024-02-03: 1 mg via INTRAVENOUS
  Filled 2024-02-03: qty 1

## 2024-02-03 MED ORDER — GADOBUTROL 1 MMOL/ML IV SOLN
10.0000 mL | Freq: Once | INTRAVENOUS | Status: AC | PRN
Start: 2024-02-03 — End: 2024-02-03
  Administered 2024-02-03: 10 mL via INTRAVENOUS

## 2024-02-03 MED ORDER — PREDNISONE 10 MG PO TABS: ORAL_TABLET | ORAL | 0 refills | Status: AC

## 2024-02-03 MED ORDER — IPRATROPIUM-ALBUTEROL 0.5-2.5 (3) MG/3ML IN SOLN
3.0000 mL | Freq: Once | RESPIRATORY_TRACT | Status: AC
  Administered 2024-02-03: 3 mL via RESPIRATORY_TRACT
  Filled 2024-02-03: qty 3

## 2024-02-03 MED ORDER — MAGNESIUM SULFATE 2 GM/50ML IV SOLN
2.0000 g | Freq: Once | INTRAVENOUS | Status: AC
  Administered 2024-02-03: 2 g via INTRAVENOUS
  Filled 2024-02-03: qty 50

## 2024-02-03 MED ORDER — ONDANSETRON 4 MG PO TBDP
ORAL_TABLET | ORAL | Status: AC
  Filled 2024-02-03: qty 1

## 2024-02-03 MED ORDER — LORATADINE 10 MG PO TABS
10.0000 mg | ORAL_TABLET | Freq: Once | ORAL | Status: DC
  Filled 2024-02-03: qty 1

## 2024-02-03 MED ORDER — KETOROLAC TROMETHAMINE 15 MG/ML IJ SOLN
15.0000 mg | Freq: Once | INTRAMUSCULAR | Status: AC
  Administered 2024-02-03: 15 mg via INTRAVENOUS
  Filled 2024-02-03: qty 1

## 2024-02-03 MED ORDER — OXYCODONE HCL 5 MG PO TABS: 5.0000 mg | ORAL_TABLET | Freq: Four times a day (QID) | ORAL | 0 refills | Status: AC | PRN

## 2024-02-03 MED ORDER — ALBUTEROL SULFATE (2.5 MG/3ML) 0.083% IN NEBU
10.0000 mg/h | INHALATION_SOLUTION | Freq: Once | RESPIRATORY_TRACT | Status: AC
  Administered 2024-02-03: 10 mg/h via RESPIRATORY_TRACT
  Filled 2024-02-03: qty 12

## 2024-02-03 MED ORDER — FENTANYL CITRATE PF 50 MCG/ML IJ SOSY
50.0000 ug | PREFILLED_SYRINGE | Freq: Once | INTRAMUSCULAR | Status: AC
  Administered 2024-02-03: 50 ug via INTRAVENOUS
  Filled 2024-02-03: qty 1

## 2024-02-03 MED ORDER — OXYCODONE-ACETAMINOPHEN 5-325 MG PO TABS
1.0000 | ORAL_TABLET | Freq: Once | ORAL | Status: AC
  Administered 2024-02-03: 1 via ORAL
  Filled 2024-02-03: qty 1

## 2024-02-03 NOTE — ED Provider Notes (Signed)
 I provided a substantive portion of the care of this patient.  I personally made/approved the management plan for this patient and take responsibility for the patient management.  EKG Interpretation Date/Time:  Friday Feb 03 2024 06:08:56 EDT Ventricular Rate:  70 PR Interval:  175 QRS Duration:  89 QT Interval:  359 QTC Calculation: 388 R Axis:   52  Text Interpretation: Sinus rhythm Confirmed by Palumbo, April ([id number]) on 02/03/2024 6:24:27 AM  Patient seen by me along with daytime physician assistant.  Patient with sudden onset of pretty severe right hip pain without any trauma.  Patient is followed by Dr. Rozelle Corning from orthopedics.  Labs here reassuring troponin 2 white count was significantly elevated 17.2.  They went and did an MRI of the hip without signs of any thing significant or any evidence of infection.  Complete metabolic panel without any acute findings renal function is normal.  Patient's respiratory panel is negative because there was a respiratory component.  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  The MRI was done with and without no evidence of any infection.  But there was moderate right hip joint effusion.  Patient also had Doppler studies due to the some of the pain on the right groin area and that was negative.  I think patient needs better pain control we will touch base with whoever is covering for Dr. Rozelle Corning from orthopedics patient will need crutches and close follow-up with orthopedics.  Will see with orthopedics says.   Yannely Kintzel, MD 02/03/24 1224

## 2024-02-03 NOTE — ED Triage Notes (Signed)
 Pt reports sudden right hip pain that goes into the groin. Pain described as sharp worsening with movement, Denies and injury or trauma.

## 2024-02-03 NOTE — ED Provider Notes (Signed)
 Came in for SOB and wheezing. Also right sided groin pain that started last night. Follow up on MRI hip and scrotal US  . Physical Exam  BP 107/73   Pulse 70   Temp 98.2 F (36.8 C)   Resp 17   SpO2 96%   Physical Exam Vitals and nursing note reviewed.  Constitutional:      General: He is not in acute distress.    Appearance: He is well-developed.  HENT:     Head: Normocephalic and atraumatic.  Eyes:     Conjunctiva/sclera: Conjunctivae normal.  Cardiovascular:     Rate and Rhythm: Normal rate and regular rhythm.     Heart sounds: No murmur heard.    Comments: 2+ dorsalis pedis pulse bilaterally Pulmonary:     Effort: Pulmonary effort is normal. No respiratory distress.     Breath sounds: Normal breath sounds.  Abdominal:     Palpations: Abdomen is soft.     Tenderness: There is no abdominal tenderness.  Musculoskeletal:        General: No swelling.     Cervical back: Neck supple.     Comments: Able to flex and extend at the hips, knees bilaterally.  Able to plantarflex and dorsiflex bilaterally.  Pain with hip flexion on the right side.  Skin:    General: Skin is warm and dry.     Capillary Refill: Capillary refill takes less than 2 seconds.     Comments: No overlying skin changes of the groin region or lower extremities bilaterally.    Neurological:     Mental Status: He is alert.     Comments: 4/5 strength with resisted knee extension and ankle plantarflexion dorsiflexion on the right side secondary to pain.  5/5 strength of the left lower extremity with resisted knee extension, ankle plantarflexion and dorsiflexion.  Intact sensation of the bilateral lower extremities  Psychiatric:        Mood and Affect: Mood normal.     Procedures  Procedures  ED Course / MDM    Medical Decision Making Amount and/or Complexity of Data Reviewed Labs: ordered. Radiology: ordered.  Risk OTC drugs. Prescription drug management.   Patient received at signout.  In short,  patient presented to the ER with right hip/groin pain that started last night fairly suddenly.  Pain has been constant since then.  He also was having some wheezing and cough for the past couple weeks.  Patient was treated with DuoNeb and magnesium  by previous team for his wheezing.  Upon reevaluation, he still has some wheezing noted on exam, but this is mild.  He is talking in full sentences on room air, 96% on room air.  Chest x-ray without any signs of pneumonia, pneumothorax, pleural effusion, or other acute abnormality.  COVID, flu, RSV negative.  Suspect he likely had viral URI due to cause a flare of symptoms.  Regarding his right groin/thigh pain, patient without any overlying skin changes or erythema to suggest cellulitis.  Scrotal ultrasound without any signs of torsion or infection.  MRI of the right hip was performed for further evaluation.  This showed a moderate right hip joint effusion, but no signs of infection.  Appropriate vascularization, no worsening of avascular necrosis.  There was also a subtle edema signal in the distal iliopsoas muscle, which could be the result of muscle strain.  He is able to perform full range of motion of the bilateral lower extremities, but with pain on the right side.  Neurovascular intact  in the lower extremities.  I Consulted with orthopedics Dr. Hermina Loosen, reviewed lab and imaging findings, he feels patient's right hip pain is most likely due to a reactive effusion from viral illness.  Recommends close follow-up with Dr. Rozelle Corning.  Patient was provided crutches to help with ambulation and pain.  Will have patient continue with Tylenol  and ibuprofen  at home as needed for pain.  Will prescribe short course of oxycodone  for breakthrough pain.  He states he has a follow-up appointment with Dr. Rozelle Corning in 1 week, encouraged him to attend this appointment.  Return precautions given.   Patient was staffed with my attending Dr. Zackowski who also personally evaluated  patient and is in agreement with plan. Feels patient is appropriate for discharge home, recommends providing short course of oxycodone  for breakthrough pain.       Rexie Catena, PA-C 02/03/24 1413    Nicklas Barns, MD 02/03/24 [phone]

## 2024-02-03 NOTE — ED Provider Notes (Signed)
 [place] EMERGENCY DEPARTMENT AT [facility] Provider Note   CSN: [id number] Arrival date & time: 02/03/24  4098     History Chief Complaint  Patient presents with   Hip Pain    Keith Swanson is a 48 y.o. male self reportedly otherwise healthy presents to the ER today for evaluation of multiple complaints.  Initially, patient reports he has been seen in urgent care several times and been given prednisone  packs and albuterol  inhalers however he does not feel like this is worsening.  He reports he has been having audible wheezing, chest pain, and shortness of breath for the past few weeks/several months.  He reports he is already gone through an entire albuterol  inhaler.  While he was sitting watching television earlier this morning, he reports he had a sudden anterior/groin pain.  He denies any pain into the back.  Reports pain radiates into the anterior thigh on the right.  He reports he only has testicular pain whenever he coughs.  Denies any abdominal pain, nausea, vomiting.  Reports some runny nose and nasal congestion but denies any fevers.  Denies any dysuria or hematuria.  He reports that some of the pain radiates in to the anterior thigh some.  Denies any back pain.  He denies any known trauma. He was able to ambulate with difficulty using a cane and his partner. He was wheeled into the ER. Reports daily tobacco use with 1 pk/day. Near daily alcohol consumption with a 20oz [employer] lemonade daily.  Denies any previous history of diagnosis of COPD, asthma, or emphysema.   Hip Pain Associated symptoms include chest pain and shortness of breath. Pertinent negatives include no abdominal pain.       Home Medications Prior to Admission medications   Medication Sig Start Date End Date Taking? Authorizing Provider  VENTOLIN  HFA 108 (90 Base) MCG/ACT inhaler Inhale 1 puff into the lungs every 4 (four) hours as needed.   Yes [provider]  azithromycin  (ZITHROMAX ) 250  MG tablet Take 250 mg by mouth as directed. Patient not taking: Reported on 02/03/2024 01/17/24   [provider]  celecoxib  (CELEBREX ) 100 MG capsule Take 100 mg by mouth 2 (two) times daily. Patient not taking: Reported on 02/03/2024 07/19/23   [provider]  pantoprazole  (PROTONIX ) 40 MG tablet Take 1 tablet (40 mg total) by mouth daily. Patient not taking: Reported on 02/03/2024 08/10/23 11/08/23  Francenia Ingle, NP  predniSONE  (DELTASONE ) 20 MG tablet Take 20 mg by mouth 2 (two) times daily. Patient not taking: Reported on 02/03/2024 01/17/24   [provider]  QUEtiapine (SEROQUEL) 50 MG tablet Take 50 mg by mouth at bedtime. Patient not taking: Reported on 02/03/2024    [provider]  sertraline (ZOLOFT) 25 MG tablet Take 25 mg by mouth daily. Patient not taking: Reported on 02/03/2024 09/26/23   [provider]      Allergies    Penicillins and Doxycycline    Review of Systems   Review of Systems  Constitutional:  Negative for chills and fever.  HENT:  Positive for congestion and rhinorrhea.   Respiratory:  Positive for cough, shortness of breath and wheezing.   Cardiovascular:  Positive for chest pain.  Gastrointestinal:  Negative for abdominal pain, nausea and vomiting.  Genitourinary:  Positive for testicular pain. Negative for dysuria and hematuria.  Musculoskeletal:  Positive for arthralgias. Negative for myalgias.    Physical Exam Updated Vital Signs BP (!) 135/96   Pulse  69   Temp 98 F (36.7 C)   Resp 20   SpO2 99%  Physical Exam Vitals and nursing note reviewed. Exam conducted with a chaperone present Fabio Holts, RN).  Constitutional:      General: He is not in acute distress.    Appearance: He is not ill-appearing or toxic-appearing.  HENT:     Mouth/Throat:     Mouth: Mucous membranes are moist.  Eyes:     General: No scleral icterus. Cardiovascular:     Pulses:          Dorsalis pedis pulses are 2+ on the right side  and 2+ on the left side.       Posterior tibial pulses are 2+ on the right side and 2+ on the left side.  Pulmonary:     Effort: Pulmonary effort is normal. No respiratory distress.     Breath sounds: Wheezing present.     Comments: Audible wheezing. Inspiratory and expiratory wheezing with prolonged expiratory phase heard throughout all lung fields. No retractions. Speaking in full sentences, however does appear slightly winded. Satting well on room air.  Abdominal:     Palpations: Abdomen is soft.     Tenderness: There is no abdominal tenderness. There is no guarding or rebound.  Genitourinary:    Comments: Testicles appear in normal lie. Symmetric in size. Non tender to palpation. No swelling noted. No palpable masses. Patient is unable to stand 2/2 pain, however when she does sit up, his right testicle does change position. Difficult to test for inguinal hernia given this.  Musculoskeletal:     Right lower leg: No edema.     Left lower leg: No edema.       Legs:     Comments: Tender to the marked area above.  Palpable femoral pulse.  There is no overlying skin changes noted.  Significantly tender throughout the inguinal crease however is mainly concentrated in the middle were noted.  There is no induration or fluctuance.  No erythema.  No overlying rash or increase in warmth.  He does have some tenderness into the more superior anterior thigh.  His compartments are soft throughout.  Neuro vastly intact distally.  Palpable pulses that are 2+ and symmetric.  Coloration, temperature, and size appear and feel symmetric bilaterally.He has pain with lifting either leg in the marked area above as well.   Neurological:     Mental Status: He is alert.     ED Results / Procedures / Treatments   Labs (all labs ordered are listed, but only abnormal results are displayed) Labs Reviewed  CBC WITH DIFFERENTIAL/PLATELET - Abnormal; Notable for the following components:      Result Value   WBC 17.2  (*)    Neutro Abs 9.2 (*)    Lymphs Abs 5.2 (*)    Monocytes Absolute 1.6 (*)    Eosinophils Absolute 0.6 (*)    Abs Immature Granulocytes 0.52 (*)    All other components within normal limits  RESP PANEL BY RT-PCR (RSV, FLU A&B, COVID)  RVPGX2  COMPREHENSIVE METABOLIC PANEL WITH GFR  TROPONIN I (HIGH SENSITIVITY)    EKG EKG Interpretation Date/Time:  Friday Feb 03 2024 06:08:56 EDT Ventricular Rate:  70 PR Interval:  175 QRS Duration:  89 QT Interval:  359 QTC Calculation: 388 R Axis:   52  Text Interpretation: Sinus rhythm Confirmed by Maralee Senate, April ([id number]) on 02/03/2024 6:24:27 AM  Radiology No results found.  Procedures Procedures    Medications  Ordered in ED Medications  magnesium  sulfate IVPB 2 g 50 mL (2 g Intravenous New Bag/Given 02/03/24 0636)  loratadine  (CLARITIN ) tablet 10 mg (0 mg Oral Hold 02/03/24 1610)  albuterol  (PROVENTIL ) (2.5 MG/3ML) 0.083% nebulizer solution (has no administration in time range)  oxyCODONE -acetaminophen  (PERCOCET/ROXICET) 5-325 MG per tablet 1 tablet (1 tablet Oral Given 02/03/24 0507)  ipratropium-albuterol  (DUONEB) 0.5-2.5 (3) MG/3ML nebulizer solution 3 mL (3 mLs Nebulization Given 02/03/24 9604)  ketorolac  (TORADOL ) 15 MG/ML injection 15 mg (15 mg Intravenous Given 02/03/24 0617)  ipratropium-albuterol  (DUONEB) 0.5-2.5 (3) MG/3ML nebulizer solution 3 mL (3 mLs Nebulization Given 02/03/24 5409)    ED Course/ Medical Decision Making/ A&P  Medical Decision Making Amount and/or Complexity of Data Reviewed Labs: ordered. Radiology: ordered.  Risk OTC drugs. Prescription drug management.   48 y.o. male presents to the ER for evaluation of cough, SOB, hip pain. Differential diagnosis includes but is not limited to testicular torsion, arthritis, dissection, septic arthritis, MSK, COPD exacerbation, viral illness, pneumonia. Vital signs mildly elevated blood pressure otherwise unremarkable. Physical exam as noted above.   On previous chart  evaluation, the patient had septic arthritis in his left elbow that required surgical washout and he was discharged with a PICC line. He denies any IVDU ever.   I independently reviewed and interpreted the patient's labs.  CBC shows leukocytosis with a left shift but also elevation in lymphocyte count, monocyte count, acidophil's, and absolute granulocytes.  Patient reports he last finished his last prednisone  pack 2 weeks ago. Other labs pending at this time.   Given the patient's presentation, this is a broad differential. Will obtain US  to r/o testicular cause. Will order MRI to r/o any septic arthritis. Additionally, I have ordered him magnesium , multiple duonebs and continuous breathing treatment for his wheezing. Will continue with SOB workup. Did not order prednisone  till infection r/o. Will hand off to oncoming shift to follow up with labs and work up.   7:01 AM Care of Lars Poche transferred to PA Rexie Catena  at the end of my shift as the patient will require reassessment once labs/imaging have resulted. Patient presentation, ED course, and plan of care discussed with review of all pertinent labs and imaging. Please see his/her note for further details regarding further ED course and disposition. Plan at time of handoff is follow up with imaging and labs. This may be altered or completely changed at the discretion of the oncoming team pending results of further workup.  I discussed this case with my attending physician who cosigned this note including patient's presenting symptoms, physical exam, and planned diagnostics and interventions. Attending physician stated agreement with plan or made changes to plan which were implemented.   Attending physician assessed patient at bedside.  Portions of this report may have been transcribed using voice recognition software. Every effort was made to ensure accuracy; however, inadvertent computerized transcription errors may be present.   Final  Clinical Impression(s) / ED Diagnoses Final diagnoses:  None    Rx / DC Orders ED Discharge Orders     None         Spence Dux, PA-C 02/03/24 8119    Palumbo, April, MD 02/07/24 0002

## 2024-02-03 NOTE — ED Notes (Signed)
 Patient transported to MRI

## 2024-02-03 NOTE — Discharge Instructions (Addendum)
 As discussed, your right hip pain is likely due to the increased fluid around the right hip noted on the MRI scan. This is likely secondary to a viral process.  This is also likely why you have increased wheezing.  Your flu, COVID, and RSV testing was negative.  Your chest x-ray did not show any signs of pneumonia or fluid in the lungs.  I have included your MRI results below for your reference.  As discussed, please follow-up with Dr. Rozelle Corning at your appointment next week for follow-up.  You may take up to 1000mg  of tylenol  every 6 hours as needed for pain.  Do not take more then 4g per day.  You may use up to 600mg  ibuprofen  every 6 hours as needed for pain.  Do not exceed 2.4g of ibuprofen  per day.  You have been prescribed Oxycodone -this is a narcotic/controlled substance medication that has potential addicting qualities.  You may take 1 tablet every 6 hours as needed for severe pain.  Do not drive or operate heavy machinery when taking this medicine as it can be sedating. Do not drink alcohol or take other sedating medications when taking this medicine for safety reasons.  Keep this out of reach of small children.     You have been prescribed prednisone  to help with your wheezing. Please take this medication as prescribed for the next 7 days (40mg  on days 1 and 2, 30mg  on days 3 and 4, 20mg  on days 5 and 6, 10mg  on day 7). Take this medication in the morning with breakfast, as taking it at night may make it hard to sleep.   As discussed, please make appointment with the pulmonologist for further evaluation of your wheezing.  Please follow-up with your PCP within the next month for further evaluation.  Return to the ER for any severe worsening of your pain, numbness in your foot, fevers, any other new or concerning symptoms.   "EXAM: MRI OF THE RIGHT HIP WITHOUT AND WITH CONTRAST   TECHNIQUE: Multiplanar, multisequence MR imaging was performed both before and after administration of  intravenous contrast.   CONTRAST:  10mL GADAVIST GADOBUTROL 1 MMOL/ML IV SOLN   COMPARISON:  None Available.   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 No fracture or flattening of the femoral heads is identified.   Mild spurring of both sacroiliac joints. Mild spurring of both femoral heads and acetabula.   Disc desiccation at L4-5 with mild type 1 degenerative endplate findings.   Mild spurring of the pubis.   Articular cartilage and labrum   Articular cartilage: Suspected moderate degenerative chondral thinning.   Labrum:  No discrete tear observed.   Joint or bursal effusion   Joint effusion:  Moderate right hip joint effusion.   Bursae: No regional bursitis.   Muscles and tendons   Muscles and tendons: Subtle edema signal in the distal iliopsoas muscle adjacent to joint effusion, muscle strain not excluded.   Other findings   Miscellaneous:   No supplemental non-categorized findings.   IMPRESSION: 1. 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 No fracture or flattening of the femoral heads is identified. 2. Moderate right hip joint effusion. 3. Subtle edema signal in the distal iliopsoas muscle adjacent to joint effusion, muscle strain not excluded. 4. Mild degenerative arthropathy of both sacroiliac joints and hips. 5. Disc desiccation at L4-5 with mild  type 1 degenerative endplate findings.     Electronically Signed   By: Freida Jes M.D.   On: 02/03/2024 10:59"

## 2024-02-06 ENCOUNTER — Telehealth: Payer: Self-pay | Admitting: Orthopedic Surgery

## 2024-02-06 NOTE — Telephone Encounter (Signed)
 Lauren I sent dr. Rozelle Corning a message on this

## 2024-02-06 NOTE — Telephone Encounter (Signed)
 Patient called and said he went to the ER Friday because the pain was unbearable and they gave him prednisone  and pain medication. He stated that the pain has gotten worse. CB#[phone]

## 2024-02-06 NOTE — Telephone Encounter (Signed)
 Can you schedule him for a day when me and xu are  around the office at the same time thanks

## 2024-02-06 NOTE — Telephone Encounter (Signed)
 Patient called and said he needs a refill on the Oxycodone . CB#[phone]

## 2024-02-07 ENCOUNTER — Ambulatory Visit: Payer: Medicaid Other | Admitting: Family Medicine

## 2024-02-07 NOTE — Telephone Encounter (Signed)
 Pt is coming in Friday morning, please let Dr. Christiane Cowing know about this:)

## 2024-02-08 DIAGNOSIS — R0602 Shortness of breath: Secondary | ICD-10-CM | POA: Diagnosis not present

## 2024-02-08 DIAGNOSIS — R590 Localized enlarged lymph nodes: Secondary | ICD-10-CM | POA: Diagnosis not present

## 2024-02-08 DIAGNOSIS — Z87898 Personal history of other specified conditions: Secondary | ICD-10-CM | POA: Diagnosis not present

## 2024-02-08 DIAGNOSIS — M25551 Pain in right hip: Secondary | ICD-10-CM | POA: Diagnosis not present

## 2024-02-08 DIAGNOSIS — E8809 Other disorders of plasma-protein metabolism, not elsewhere classified: Secondary | ICD-10-CM | POA: Diagnosis not present

## 2024-02-08 DIAGNOSIS — R1031 Right lower quadrant pain: Secondary | ICD-10-CM | POA: Diagnosis not present

## 2024-02-08 DIAGNOSIS — M25451 Effusion, right hip: Secondary | ICD-10-CM | POA: Diagnosis not present

## 2024-02-10 ENCOUNTER — Encounter: Payer: Self-pay | Admitting: Orthopedic Surgery

## 2024-02-10 ENCOUNTER — Ambulatory Visit: Admitting: Orthopedic Surgery

## 2024-02-10 DIAGNOSIS — M87051 Idiopathic aseptic necrosis of right femur: Secondary | ICD-10-CM

## 2024-02-10 NOTE — Progress Notes (Unsigned)
 Office Visit Note   Patient: Ferris Valen           Date of Birth: Jun 15, 1976           MRN: [id number] Visit Date: 02/10/2024 Requested by: Cranston Dk, MD [address] [place],  [place] [address] PCP: Cranston Dk, MD  Subjective: Chief Complaint  Patient presents with   Right Hip - Pain    HPI: Brekyn Vangorder is a 48 y.o. male who presents to the office reporting right hip and leg pain with acute onset 1 week ago.  Denies any history of injury.  Went to the emergency department and was told it could possibly be a hernia.  Was sent home with a Medrol  Dosepak and oxycodone .  Does report groin pain thigh pain buttock pain and low back pain.  White count 17,000 but that is since decreased to 15,000.  He denies any systemic symptoms such as fevers chills.  He does have a history of gout.  He also has sickle cell trait.  MRI scan does show mild to moderate AVN bilaterally with effusion in the right hip joint..                ROS: All systems reviewed are negative as they relate to the chief complaint within the history of present illness.  Patient denies fevers or chills.  Assessment & Plan: Visit Diagnoses:  1. Avascular necrosis of bone of right hip (HCC)     Plan: Impression is AVN bilateral hips with no evidence of collapse of the femoral head.  He does have an effusion in the right hip which is most consistent with gout particularly given his response to the steroids.  He is currently walking well and his pain has decreased significantly.  I do not think this is surgical avascular necrosis.  Most likely explanation is gout flare.  Continue with oral anti-inflammatory and follow-up if symptoms do not improve.  Follow-Up Instructions: No follow-ups on file.   Orders:  No orders of the defined types were placed in this encounter.  No orders of the defined types were placed in this encounter.     Procedures: No procedures performed   Clinical Data: No additional  findings.  Objective: Vital Signs: There were no vitals taken for this visit.  Physical Exam:  Constitutional: Patient appears well-developed HEENT:  Head: Normocephalic Eyes:EOM are normal Neck: Normal range of motion Cardiovascular: Normal rate Pulmonary/chest: Effort normal Neurologic: Patient is alert Skin: Skin is warm Psychiatric: Patient has normal mood and affect  Ortho Exam: Ortho exam demonstrates normal gait and alignment.  No real groin pain with internal or external rotation of either leg.  No nerve root tension signs.  Has very good hip flexion abduction adduction strength.  No masses lymphadenopathy or skin changes noted in that right hip region.  No paresthesias L1 S1 bilaterally  Specialty Comments:  No specialty comments available.  Imaging: No results found.   PMFS History: Patient Active Problem List   Diagnosis Date Noted   Insomnia 08/11/2023   Gastroesophageal reflux disease without esophagitis 08/11/2023   Medication management 07/19/2023   Chest pain 07/19/2023   Cellulitis of left elbow 06/18/2023   Septic arthritis of elbow, left (HCC) 06/15/2023   Cellulitis of left arm 06/11/2023   Transaminitis 06/11/2023   Hypoalbuminemia 06/11/2023   Tobacco use 06/11/2023   History of alcohol abuse 02/14/2023   Anxiety 02/14/2023   Mild intermittent asthma with (acute) exacerbation 04/22/2021  Obesity (BMI 30-39.9) 04/22/2021   Past Medical History:  Diagnosis Date   Anaphylaxis    Arthritis    Asthma    Bronchitis    Depression    Low back pain    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   Heart disease Maternal Grandfather    Dementia Paternal Grandfather    Cancer Other     Past Surgical History:  Procedure Laterality Date   ELBOW SURGERY Left    FACIAL FRACTURE SURGERY     HERNIA REPAIR     I & D EXTREMITY Left 06/13/2023   Procedure: IRRIGATION AND DEBRIDEMENT ELBOW, Bursa and Joint;  Surgeon: Jasmine Mesi, MD;  Location: [facility];  Service: Orthopedics;  Laterality: Left;   WISDOM TOOTH EXTRACTION     Social History   Occupational History   Not on file  Tobacco Use   Smoking status: Every Day    Current packs/day: 0.50    Types: Cigarettes   Smokeless tobacco: Never   Tobacco comments:    1 pack every 3 days  Vaping Use   Vaping status: Never Used  Substance and Sexual Activity   Alcohol use: Yes    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

## 2024-02-15 DIAGNOSIS — K219 Gastro-esophageal reflux disease without esophagitis: Secondary | ICD-10-CM | POA: Diagnosis not present

## 2024-02-15 DIAGNOSIS — J441 Chronic obstructive pulmonary disease with (acute) exacerbation: Secondary | ICD-10-CM | POA: Diagnosis not present

## 2024-02-15 DIAGNOSIS — Z720 Tobacco use: Secondary | ICD-10-CM | POA: Diagnosis not present

## 2024-02-15 DIAGNOSIS — M10051 Idiopathic gout, right hip: Secondary | ICD-10-CM | POA: Diagnosis not present

## 2024-02-20 ENCOUNTER — Ambulatory Visit: Admitting: Orthopedic Surgery

## 2024-02-24 DIAGNOSIS — E559 Vitamin D deficiency, unspecified: Secondary | ICD-10-CM | POA: Diagnosis not present

## 2024-02-24 DIAGNOSIS — J441 Chronic obstructive pulmonary disease with (acute) exacerbation: Secondary | ICD-10-CM | POA: Diagnosis not present

## 2024-02-24 DIAGNOSIS — M10051 Idiopathic gout, right hip: Secondary | ICD-10-CM | POA: Diagnosis not present

## 2024-03-08 DIAGNOSIS — R0602 Shortness of breath: Secondary | ICD-10-CM | POA: Diagnosis not present

## 2024-03-08 DIAGNOSIS — K219 Gastro-esophageal reflux disease without esophagitis: Secondary | ICD-10-CM | POA: Diagnosis not present

## 2024-03-08 DIAGNOSIS — J45901 Unspecified asthma with (acute) exacerbation: Secondary | ICD-10-CM | POA: Diagnosis not present

## 2024-03-08 DIAGNOSIS — J441 Chronic obstructive pulmonary disease with (acute) exacerbation: Secondary | ICD-10-CM | POA: Diagnosis not present

## 2024-03-09 DIAGNOSIS — J45901 Unspecified asthma with (acute) exacerbation: Secondary | ICD-10-CM | POA: Diagnosis not present

## 2024-03-10 DIAGNOSIS — R220 Localized swelling, mass and lump, head: Secondary | ICD-10-CM | POA: Diagnosis not present

## 2024-03-10 DIAGNOSIS — R0602 Shortness of breath: Secondary | ICD-10-CM | POA: Diagnosis not present

## 2024-03-10 DIAGNOSIS — J45901 Unspecified asthma with (acute) exacerbation: Secondary | ICD-10-CM | POA: Diagnosis not present

## 2024-03-11 DIAGNOSIS — J45901 Unspecified asthma with (acute) exacerbation: Secondary | ICD-10-CM | POA: Diagnosis not present

## 2024-03-11 DIAGNOSIS — R079 Chest pain, unspecified: Secondary | ICD-10-CM | POA: Diagnosis not present

## 2024-03-12 DIAGNOSIS — J45901 Unspecified asthma with (acute) exacerbation: Secondary | ICD-10-CM | POA: Diagnosis not present

## 2024-03-13 DIAGNOSIS — J4550 Severe persistent asthma, uncomplicated: Secondary | ICD-10-CM | POA: Diagnosis not present

## 2024-03-13 DIAGNOSIS — J984 Other disorders of lung: Secondary | ICD-10-CM | POA: Diagnosis not present

## 2024-03-13 DIAGNOSIS — R55 Syncope and collapse: Secondary | ICD-10-CM | POA: Diagnosis not present

## 2024-03-13 DIAGNOSIS — R054 Cough syncope: Secondary | ICD-10-CM | POA: Diagnosis not present

## 2024-05-20 DIAGNOSIS — R079 Chest pain, unspecified: Secondary | ICD-10-CM | POA: Diagnosis not present

## 2024-05-20 DIAGNOSIS — J9600 Acute respiratory failure, unspecified whether with hypoxia or hypercapnia: Secondary | ICD-10-CM | POA: Diagnosis not present

## 2024-05-20 DIAGNOSIS — R0602 Shortness of breath: Secondary | ICD-10-CM | POA: Diagnosis not present

## 2024-05-20 DIAGNOSIS — J4550 Severe persistent asthma, uncomplicated: Secondary | ICD-10-CM | POA: Diagnosis not present

## 2024-05-21 DIAGNOSIS — J9600 Acute respiratory failure, unspecified whether with hypoxia or hypercapnia: Secondary | ICD-10-CM | POA: Diagnosis not present

## 2024-05-22 DIAGNOSIS — J9600 Acute respiratory failure, unspecified whether with hypoxia or hypercapnia: Secondary | ICD-10-CM | POA: Diagnosis not present

## 2024-05-23 DIAGNOSIS — J4551 Severe persistent asthma with (acute) exacerbation: Secondary | ICD-10-CM | POA: Diagnosis not present

## 2024-06-11 ENCOUNTER — Encounter: Payer: Self-pay | Admitting: Family Medicine

## 2024-07-04 DIAGNOSIS — J4541 Moderate persistent asthma with (acute) exacerbation: Secondary | ICD-10-CM | POA: Diagnosis not present

## 2024-07-05 DIAGNOSIS — J4541 Moderate persistent asthma with (acute) exacerbation: Secondary | ICD-10-CM | POA: Diagnosis not present

## 2024-07-05 NOTE — Progress Notes (Signed)
 Case Management Discharge Note        CSN: [id number] DOB: 28-May-1976 Service: General Medicine Location: A865/A  Patient Class: Inpatient  DC Disposition: : Home or Self Care  Discharge DC Disposition: : Home or Self Care DME: Nebulizer Patient DME Agency: [facility]  Discharge Referrals Case closed, patient/family agree with disposition plan: Yes  Pt was dc home today. Dr asked for nebulizer for pt. Sw spoke with pt who report that has transport home. Pt states that no preference for dme provider. Sw made referral to Jermaine with [employer] [phone] who delivered nebulizer to pt's room prior to dc today.  No request for further intervention. Case closed.   Durable Medical Equipment Coordination Status: Coordination complete.    Case Management Coordination Status: Coordination Complete     Mliss Kitty, MSW

## 2024-07-06 DIAGNOSIS — R079 Chest pain, unspecified: Secondary | ICD-10-CM | POA: Diagnosis not present

## 2024-07-07 DIAGNOSIS — R0789 Other chest pain: Secondary | ICD-10-CM | POA: Diagnosis not present

## 2024-07-07 DIAGNOSIS — J810 Acute pulmonary edema: Secondary | ICD-10-CM | POA: Diagnosis not present

## 2024-08-06 ENCOUNTER — Encounter: Payer: Self-pay | Admitting: Radiology

## 2024-08-06 DIAGNOSIS — J4551 Severe persistent asthma with (acute) exacerbation: Secondary | ICD-10-CM | POA: Diagnosis not present

## 2024-08-16 DIAGNOSIS — J8283 Eosinophilic asthma: Secondary | ICD-10-CM | POA: Diagnosis not present

## 2024-08-16 DIAGNOSIS — Z7952 Long term (current) use of systemic steroids: Secondary | ICD-10-CM | POA: Diagnosis not present

## 2024-08-16 DIAGNOSIS — J45901 Unspecified asthma with (acute) exacerbation: Secondary | ICD-10-CM | POA: Diagnosis not present

## 2024-08-16 DIAGNOSIS — J4550 Severe persistent asthma, uncomplicated: Secondary | ICD-10-CM | POA: Diagnosis not present

## 2024-09-10 DIAGNOSIS — J4550 Severe persistent asthma, uncomplicated: Secondary | ICD-10-CM | POA: Diagnosis not present
